# Patient Record
Sex: Male | Born: 1955 | Race: White | Hispanic: No | Marital: Married | Smoking: Former smoker
Health system: Southern US, Community
[De-identification: ages and names within clinical notes are randomized; demographics above are authoritative.]

## PROBLEM LIST (undated history)

## (undated) DIAGNOSIS — G4730 Sleep apnea, unspecified: Secondary | ICD-10-CM

## (undated) DIAGNOSIS — K649 Unspecified hemorrhoids: Secondary | ICD-10-CM

## (undated) DIAGNOSIS — C801 Malignant (primary) neoplasm, unspecified: Secondary | ICD-10-CM

## (undated) DIAGNOSIS — Z923 Personal history of irradiation: Secondary | ICD-10-CM

## (undated) DIAGNOSIS — M1990 Unspecified osteoarthritis, unspecified site: Secondary | ICD-10-CM

## (undated) DIAGNOSIS — C20 Malignant neoplasm of rectum: Secondary | ICD-10-CM

## (undated) DIAGNOSIS — T7840XA Allergy, unspecified, initial encounter: Secondary | ICD-10-CM

## (undated) DIAGNOSIS — Z9221 Personal history of antineoplastic chemotherapy: Secondary | ICD-10-CM

## (undated) HISTORY — PX: SURGERY SCROTAL / TESTICULAR: SUR1316

## (undated) HISTORY — DX: Unspecified osteoarthritis, unspecified site: M19.90

## (undated) HISTORY — PX: LUMBAR DISC SURGERY: SHX700

## (undated) HISTORY — DX: Malignant neoplasm of rectum: C20

---

## 1997-09-29 HISTORY — PX: SHOULDER SURGERY: SHX246

## 2004-09-29 HISTORY — PX: WRIST SURGERY: SHX841

## 2013-09-26 ENCOUNTER — Telehealth (INDEPENDENT_AMBULATORY_CARE_PROVIDER_SITE_OTHER): Payer: Self-pay

## 2013-09-26 DIAGNOSIS — C20 Malignant neoplasm of rectum: Secondary | ICD-10-CM

## 2013-09-26 HISTORY — DX: Malignant neoplasm of rectum: C20

## 2013-09-26 NOTE — Telephone Encounter (Signed)
Left message for Toni Amend that patient's appointment has been moved up to 09/27/13 with Dr. Michaell Cowing.  Asked they make the patient aware.

## 2013-09-27 ENCOUNTER — Encounter (INDEPENDENT_AMBULATORY_CARE_PROVIDER_SITE_OTHER): Payer: Self-pay | Admitting: Surgery

## 2013-09-27 ENCOUNTER — Ambulatory Visit (INDEPENDENT_AMBULATORY_CARE_PROVIDER_SITE_OTHER): Payer: Commercial Indemnity | Admitting: Surgery

## 2013-09-27 ENCOUNTER — Telehealth (INDEPENDENT_AMBULATORY_CARE_PROVIDER_SITE_OTHER): Payer: Self-pay

## 2013-09-27 VITALS — BP 140/86 | HR 71 | Temp 98.8°F | Resp 16 | Ht 67.5 in | Wt 249.4 lb

## 2013-09-27 DIAGNOSIS — K429 Umbilical hernia without obstruction or gangrene: Secondary | ICD-10-CM | POA: Insufficient documentation

## 2013-09-27 DIAGNOSIS — C19 Malignant neoplasm of rectosigmoid junction: Secondary | ICD-10-CM

## 2013-09-27 DIAGNOSIS — E669 Obesity, unspecified: Secondary | ICD-10-CM

## 2013-09-27 DIAGNOSIS — C20 Malignant neoplasm of rectum: Secondary | ICD-10-CM | POA: Insufficient documentation

## 2013-09-27 DIAGNOSIS — Z720 Tobacco use: Secondary | ICD-10-CM | POA: Insufficient documentation

## 2013-09-27 NOTE — Telephone Encounter (Signed)
LM for Dr Hulen Shouts nurse to call me so we can get pt scheduled for a EUS ASAP with tattoo marking on the tumor.

## 2013-09-27 NOTE — Progress Notes (Addendum)
Subjective:     Patient ID: Stephen Costa, male   DOB: 09/29/1955, 57 y.o.   MRN: [id number]  HPI  Note: This dictation was prepared with Dragon/digital dictation along with Smartphrase technology. Any transcriptional errors that result from this process are unintentional.       Stephen Costa  08/16/1956 [id number]  Patient Care Team: [name] Sun, MD as PCP - General (Family Medicine) Vincent C. Schooler, MD as Consulting Physician (Gastroenterology)  This patient is a 57 y.o.male who presents today for surgical evaluation at the request of Dr. Schooler.   Reason for visit: Mass at rectosigmoid junction very suspicious for cancer  Pleasant male who comes today with his wife.  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  He has had  intermittent rectal bleeding over the past few years.  Thought to be due to hemorrhoids.  Not too bad until recently.  He is also become more constipated with "rabbit pellet" more narrowed stools recently.  He has had some feelings of rectal urgency and discomfort with bowel movements.  Based on concerns, he was sent for colonoscopy.  A bulky mass was found at the rectosigmoid junction, Highly suspicious for cancer.  Surgical consultation was recommended.  He found out recently and an uncle diagnosed with colon cancer.  He does not know at what age.  No other family history of gastrointestinal tract cancers.  No personal nor family history of inflammatory bowel disease, irritable bowel syndrome, allergy such as Celiac Sprue, dietary/dairy problems, colitis, ulcers nor gastritis.  No recent sick contacts/gastroenteritis.  No travel outside the country.  No changes in diet.  He struggles with left knee arthritis and is limited in his walking.  He is obese.  He does smoke about one pack a day.  He has never had any abdominal surgery or anal rectal interventions.   No exertional chest/neck/shoulder/arm pain.  Patient can walk 15 minutes for about 1/4 miles  without difficulty.   Patient Active Problem List   Diagnosis Date Noted  . Rectal cancer - Anterior 8cm from anal verge 09/27/2013  . Umbilical hernia - 1cm 09/27/2013  . Obesity (BMI 30-39.9) 09/27/2013  . Tobacco abuse 09/27/2013    Past Medical History  Diagnosis Date  . Arthritis   . Rectal cancer 09/26/2013    Past Surgical History  Procedure Laterality Date  . Lumbar disc surgery  1984, 1987  . Wrist surgery Right 2006  . Shoulder surgery Right 1999  . Surgery scrotal / testicular Left 57 y/o    respositioning into scrotum    History   Social History  . Marital Status: Married    Spouse Name: N/A    Number of Children: N/A  . Years of Education: N/A   Occupational History  . Not on file.   Social History Main Topics  . Smoking status: Current Every Day Smoker -- 30 years    Types: Cigarettes  . Smokeless tobacco: Not on file  . Alcohol Use: No  . Drug Use: No  . Sexual Activity: Not on file   Other Topics Concern  . Not on file   Social History Narrative  . No narrative on file    Family History  Problem Relation Age of Onset  . Cancer Maternal Uncle     Throat Cancer  . Cancer Paternal Uncle     Colon Cancer    Current Outpatient Prescriptions  Medication Sig Dispense Refill  . Acetaminophen (TYLENOL PO)   Take by mouth as needed.       No current facility-administered medications for this visit.     Allergies  Allergen Reactions  . Percocet [Oxycodone-Acetaminophen] Other (See Comments)    MAKES PATIENT ANXIOUS & HYPER    BP 140/86  Pulse 71  Temp(Src) 98.8 F (37.1 C) (Temporal)  Resp 16  Ht 5' 7.5" (1.715 m)  Wt 249 lb 6.4 oz (113.127 kg)  BMI 38.46 kg/m2  No results found.   Review of Systems  Constitutional: Negative for fever, chills, diaphoresis, activity change, appetite change, fatigue and unexpected weight change.  HENT: Negative for ear discharge, facial swelling, mouth sores, nosebleeds, sore throat and trouble  swallowing.   Eyes: Negative for photophobia, discharge and visual disturbance.  Respiratory: Negative for choking, chest tightness, shortness of breath and stridor.   Cardiovascular: Negative for chest pain and palpitations.  Gastrointestinal: Positive for constipation, anal bleeding and rectal pain. Negative for nausea, vomiting, abdominal pain, diarrhea, blood in stool and abdominal distention.  Endocrine: Negative for cold intolerance and heat intolerance.  Genitourinary: Negative for dysuria, urgency, difficulty urinating and testicular pain.  Musculoskeletal: Positive for arthralgias. Negative for back pain, gait problem, myalgias, neck pain and neck stiffness.  Skin: Negative for color change, pallor, rash and wound.  Allergic/Immunologic: Negative for environmental allergies and food allergies.  Neurological: Negative for dizziness, speech difficulty, weakness, numbness and headaches.  Hematological: Negative for adenopathy. Does not bruise/bleed easily.  Psychiatric/Behavioral: Negative for hallucinations, confusion and agitation.       Objective:   Physical Exam  Constitutional: He is oriented to person, place, and time. He appears well-developed and well-nourished. No distress.  HENT:  Head: Normocephalic.  Mouth/Throat: Oropharynx is clear and moist. No oropharyngeal exudate.  Eyes: Conjunctivae and EOM are normal. Pupils are equal, round, and reactive to light. No scleral icterus.  Neck: Normal range of motion. Neck supple. No tracheal deviation present.  Cardiovascular: Normal rate, regular rhythm and intact distal pulses.   Pulmonary/Chest: Effort normal and breath sounds normal. No respiratory distress.  Abdominal: Soft. He exhibits no distension. There is no tenderness. A hernia is present. Hernia confirmed positive in the ventral area. Hernia confirmed negative in the right inguinal area and confirmed negative in the left inguinal area.    Genitourinary:  Exam done  with assistance of male Medical Assistant in the room.  Perianal skin clean with good hygiene.  No pruritis.  No pilonidal disease.  No fissure.  No abscess/fistula.  No external hemorrhoids.  Tolerates digital rectal exam.  Normal sphincter tone.  Hemorrhoidal piles WNL.  Anterior fungating tender mass somewhat fixed.  Distal end just above prostate ~8cm from anal verge   Musculoskeletal: Normal range of motion. He exhibits no tenderness.  Lymphadenopathy:    He has no cervical adenopathy.       Right: No inguinal adenopathy present.       Left: No inguinal adenopathy present.  Neurological: He is alert and oriented to person, place, and time. No cranial nerve deficit. He exhibits normal muscle tone. Coordination normal.  Skin: Skin is warm and dry. No rash noted. He is not diaphoretic. No erythema. No pallor.  Psychiatric: He has a normal mood and affect. His behavior is normal. Judgment and thought content normal.       Assessment:     Fungating anterior rectal mass with bleeding and constipating/obstructive symptoms, & narrowing caliber of stools.  Very suspicious for rectal cancer.     Plan:       Had a long discussion with the patient and his wife using audiovisual aids.  I reviewed his records.  We had a long discussion about the pathophysiology of rectal cancer.  We need more information first.  Obtain pathology results.  I would be suprised if adenocarcinoma is not diagnosed.   I would not trust any other diagnosis given the bulky nature, narrow caliber, partially fixed nature  CEA level  CT scan of chest/abdomen/pelvis.  Endoscopic rectal ultrasound to stage cancer depth.  If not technically feasible, do pelvic MRI.  If T3 or greater (strongly suspect this) proceed with neoadjuvant chemoradiation therapy, and the wait 10 weeks to do resection  Ultimately he will require rectal resection.  Sphincter sparing very feasible.  Given his risk factors (male, morbidly obese, smoker)  it will very likely require the patient to have a diverting loop ileostomy as the anastomosis is most likely going to be within 5 cm from the anal verge & leak risk will be higher.  However, we will see.  I did discuss the procedure with the patient his wife.  Good candidate for minimally invasive approach.  Robotic vs. Laparoscopic:  The anatomy & physiology of the digestive tract was discussed.  The pathophysiology was discussed.  Natural history risks without surgery was discussed.   I worked to give an overview of the disease and the frequent need to have multispecialty involvement.  I feel the risks of no intervention will lead to serious problems that outweigh the operative risks; therefore, I recommended a partial proctocolectomy to remove the pathology.  Laparoscopic & open techniques were discussed.  We will work to preserve anal & pelvic floor function without sacrificing cure.  Risks such as bleeding, infection, abscess, leak, reoperation, possible ostomy, hernia, heart attack, death, and other risks were discussed.  I noted a good likelihood this will help address the problem.   Goals of post-operative recovery were discussed as well.  We will work to minimize complications.  An educational handout on the pathology was given as well.  Questions were answered.    The patient & wife express understanding & wishes to proceed with surgery.He is very concerned about missing work.  I cautioned him that this is going to be a long drawn out involved process and he will need to take time to take care of this as it will kill him if ignored.   STOP SMOKING!  We talked to the patient about the dangers of smoking.  We stressed that tobacco use dramatically increases the risk of peri-operative complications such as infection, tissue necrosis leaving to problems with incision/wound and organ healing, hernia, chronic pain, heart attack, stroke, DVT, pulmonary embolism, and death.  We noted there are programs in  our community to help stop smoking.  Information was available.  I also recommended he try and get some regular exercise and low fat diet to lose some weight & minimize his risk of leak or hernia or other problems.  Consider swimming/water aerobics to take pressure off his knees.  Obviously that would be a challenge, but again it is something that he can try and do to help him get through this.       

## 2013-09-27 NOTE — Patient Instructions (Signed)
See the Handout(s) we gave you.  Please obtain the blood work, CAT scan, rectal ultrasound.  Once that is done, we will be able to offer further advice after we have a better sense of what stage this cancer is.  Please call us once these tests are done  Consider surgery To resect the rectal cancer.  Please call our office at [phone] if you wish to schedule surgery or if you have further questions / concerns.   Colorectal Cancer Colorectal cancer is an abnormal growth of tissue (tumor) in the colon or rectum that is cancerous (malignant). Unlike noncancerous (benign) tumors, malignant tumors can spread to other parts of your body. The colon is the large bowel or large intestine. The rectum is the last several inches of the colon.  RISK FACTORS The exact cause of colorectal cancer is unknown. However, the following factors may increase your chances of getting colorectal cancer:   Age older than 50 years.   Abnormal growths (polyps) on the inner wall of the colon or rectum.   Diabetes.   African American race.   Family history of hereditary nonpolyposis colorectal cancer. This condition is caused by changes in the genes that are responsible for repairing mismatched DNA.   Personal history of cancer. A person who has already had colorectal cancer may develop it a second time. Also, women with a history of ovarian, uterine, or breast cancer are at a somewhat higher risk of developing colorectal cancer.  Certain hereditary conditions.  Eating a diet that is high in fat (especially animal fat) and low in fiber, fruits, and vegetables.  Sedentary lifestyle.  Inflammatory bowel disease, including ulcerative colitis and Crohn disease.   Smoking.   Excessive alcohol use.  SYMPTOMS Early colorectal cancer often does not cause symptoms. As the cancer grows, symptoms may include:   Changes in bowel habits.  Diarrhea.   Constipation.   Feeling like the bowel does not  empty completely after a bowel movement.   Blood in the stool.   Stools that are narrower than usual.   Abdominal discomfort, pain, bloating, fullness, or cramps.  Frequent gas pain.   Unexplained weight loss.   Constant tiredness.   Nausea and vomiting.  DIAGNOSIS  Your health care provider will ask about your medical history. He or she may also perform a number of procedures, such as:   A physical exam.  A digital rectal exam.  A fecal occult blood test.  A barium enema.  Blood tests.   X-rays.   Imaging tests, such as CT scans or MRIs.   Taking a tissue sample (biopsy) from your colon or rectum to look for cancer cells.   A sigmoidoscopy to view the inside of the last part of your colon.   A colonoscopy to view the inside of your entire colon.   An endorectal ultrasound to see how deep a rectal tumor has grown and whether the cancer has spread to lymph nodes or other nearby tissues.  Your cancer will be staged to determine its severity and extent. Staging is a careful attempt to find out the size of the tumor, whether the cancer has spread, and if so, to what parts of the body. You may need to have more tests to determine the stage of your cancer. The test results will help determine what treatment plan is best for you.   Stage 0 The cancer is found only in the innermost lining of the colon or rectum.   Stage  I The cancer has grown into the inner wall of the colon or rectum. The cancer has not yet reached the outer wall of the colon.   Stage II The cancer extends more deeply into or through the wall of the colon or rectum. It may have invaded nearby tissue, but cancer cells have not spread to the lymph nodes.   Stage III The cancer has spread to nearby lymph nodes but not to other parts of the body.   Stage IV The cancer has spread to other parts of the body, such as the liver or lungs.  Your health care provider may tell you the detailed  stage of your cancer, which includes both a number and a letter.  TREATMENT  Depending on the type and stage, colorectal cancer may be treated with surgery, radiation therapy, chemotherapy, targeted therapy, or radiofrequency ablation. Some people have a combination of these therapies. Surgery may be done to remove the polyps from your colon. In early stages, your health care provider may be able to do this during a colonoscopy. In later stages, surgery may be done to remove part of your colon.  HOME CARE INSTRUCTIONS   Only take over-the-counter or prescription medicines for pain, discomfort, or fever as directed by your health care provider.   Maintain a healthy diet.   Consider joining a support group. This may help you learn to cope with the stress of having colorectal cancer.   Seek advice to help you manage treatment of side effects.   Keep all follow-up appointments as directed by your health care provider.   Inform your cancer specialist if you are admitted to the hospital.  SEEK MEDICAL CARE IF:  Your diarrhea or constipation does not go away.   Your bowel habits change.  You have increased abdominal pain.   You notice new fatigue or weakness.  You lose weight. Document Released: 09/15/2005 Document Revised: 05/18/2013 Document Reviewed: 03/10/2013 [facility] Patient Information 2014 [place], [place].  GETTING TO GOOD BOWEL HEALTH. Irregular bowel habits such as constipation and diarrhea can lead to many problems over time.  Having one soft bowel movement a day is the most important way to prevent further problems.  The anorectal canal is designed to handle stretching and feces to safely manage our ability to get rid of solid waste (feces, poop, stool) out of our body.  BUT, hard constipated stools can act like ripping concrete bricks and diarrhea can be a burning fire to this very sensitive area of our body, causing inflamed hemorrhoids, anal fissures, increasing risk  is perirectal abscesses, abdominal pain/bloating, an making irritable bowel worse.     The goal: ONE SOFT BOWEL MOVEMENT A DAY!  To have soft, regular bowel movements:    Drink at least 8 tall glasses of water a day.     Take plenty of fiber.  Fiber is the undigested part of plant food that passes into the colon, acting s "natures broom" to encourage bowel motility and movement.  Fiber can absorb and hold large amounts of water. This results in a larger, bulkier stool, which is soft and easier to pass. Work gradually over several weeks up to 6 servings a day of fiber (25g a day even more if needed) in the form of: o Vegetables -- Root (potatoes, carrots, turnips), leafy green (lettuce, salad greens, celery, spinach), or cooked high residue (cabbage, broccoli, etc) o Fruit -- Fresh (unpeeled skin & pulp), Dried (prunes, apricots, cherries, etc ),  or stewed (  applesauce)  o Whole grain breads, pasta, etc (whole wheat)  o Bran cereals    Bulking Agents -- This type of water-retaining fiber generally is easily obtained each day by one of the following:  o Psyllium bran -- The psyllium plant is remarkable because its ground seeds can retain so much water. This product is available as Metamucil, Konsyl, Effersyllium, Per Diem Fiber, or the less expensive generic preparation in drug and health food stores. Although labeled a laxative, it really is not a laxative.  o Methylcellulose -- This is another fiber derived from wood which also retains water. It is available as Citrucel. o Polyethylene Glycol - and "artificial" fiber commonly called Miralax or Glycolax.  It is helpful for people with gassy or bloated feelings with regular fiber o Flax Seed - a less gassy fiber than psyllium   No reading or other relaxing activity while on the toilet. If bowel movements take longer than 5 minutes, you are too constipated   AVOID CONSTIPATION.  High fiber and water intake usually takes care of this.  Sometimes a  laxative is needed to stimulate more frequent bowel movements, but    Laxatives are not a good long-term solution as it can wear the colon out. o Osmotics (Milk of Magnesia, Fleets phosphosoda, Magnesium citrate, MiraLax, GoLytely) are safer than  o Stimulants (Senokot, Castor Oil, Dulcolax, Ex Lax)    o Do not take laxatives for more than 7days in a row.    IF SEVERELY CONSTIPATED, try a Bowel Retraining Program: o Do not use laxatives.  o Eat a diet high in roughage, such as bran cereals and leafy vegetables.  o Drink six (6) ounces of prune or apricot juice each morning.  o Eat two (2) large servings of stewed fruit each day.  o Take one (1) heaping tablespoon of a psyllium-based bulking agent twice a day. Use sugar-free sweetener when possible to avoid excessive calories.  o Eat a normal breakfast.  o Set aside 15 minutes after breakfast to sit on the toilet, but do not strain to have a bowel movement.  o If you do not have a bowel movement by the third day, use an enema and repeat the above steps.    Controlling diarrhea o Switch to liquids and simpler foods for a few days to avoid stressing your intestines further. o Avoid dairy products (especially milk & ice cream) for a short time.  The intestines often can lose the ability to digest lactose when stressed. o Avoid foods that cause gassiness or bloating.  Typical foods include beans and other legumes, cabbage, broccoli, and dairy foods.  Every person has some sensitivity to other foods, so listen to our body and avoid those foods that trigger problems for you. o Adding fiber (Citrucel, Metamucil, psyllium, Miralax) gradually can help thicken stools by absorbing excess fluid and retrain the intestines to act more normally.  Slowly increase the dose over a few weeks.  Too much fiber too soon can backfire and cause cramping & bloating. o Probiotics (such as active yogurt, Align, etc) may help repopulate the intestines and colon with normal  bacteria and calm down a sensitive digestive tract.  Most studies show it to be of mild help, though, and such products can be costly. o Medicines:   Bismuth subsalicylate (ex. Kayopectate, Pepto Bismol) every 30 minutes for up to 6 doses can help control diarrhea.  Avoid if pregnant.   Loperamide (Immodium) can slow down diarrhea.  Start with  two tablets (4mg  total) first and then try one tablet every 6 hours.  Avoid if you are having fevers or severe pain.  If you are not better or start feeling worse, stop all medicines and call your doctor for advice o Call your doctor if you are getting worse or not better.  Sometimes further testing (cultures, endoscopy, X-ray studies, bloodwork, etc) may be needed to help diagnose and treat the cause of the diarrhea.  STOP SMOKING!  We strongly recommend that you stop smoking.  Smoking increases the risk of surgery including infection in the form of an open wound, pus formation, abscess, hernia at an incision on the abdomen, etc.  You have an increased risk of other MAJOR complications such as stroke, heart attack, forming clots in the leg and/or lungs, and death.    Smoking Cessation Quitting smoking is important to your health and has many advantages. However, it is not always easy to quit since nicotine is a very addictive drug. Often times, people try 3 times or more before being able to quit. This document explains the best ways for you to prepare to quit smoking. Quitting takes hard work and a lot of effort, but you can do it. ADVANTAGES OF QUITTING SMOKING  You will live longer, feel better, and live better.  Your body will feel the impact of quitting smoking almost immediately.  Within 20 minutes, blood pressure decreases. Your pulse returns to its normal level.  After 8 hours, carbon monoxide levels in the blood return to normal. Your oxygen level increases.  After 24 hours, the chance of having a heart attack starts to decrease. Your breath,  hair, and body stop smelling like smoke.  After 48 hours, damaged nerve endings begin to recover. Your sense of taste and smell improve.  After 72 hours, the body is virtually free of nicotine. Your bronchial tubes relax and breathing becomes easier.  After 2 to 12 weeks, lungs can hold more air. Exercise becomes easier and circulation improves.  The risk of having a heart attack, stroke, cancer, or lung disease is greatly reduced.  After 1 year, the risk of coronary heart disease is cut in half.  After 5 years, the risk of stroke falls to the same as a nonsmoker.  After 10 years, the risk of lung cancer is cut in half and the risk of other cancers decreases significantly.  After 15 years, the risk of coronary heart disease drops, usually to the level of a nonsmoker.  If you are pregnant, quitting smoking will improve your chances of having a healthy baby.  The people you live with, especially any children, will be healthier.  You will have extra money to spend on things other than cigarettes. QUESTIONS TO THINK ABOUT BEFORE ATTEMPTING TO QUIT You may want to talk about your answers with your caregiver.  Why do you want to quit?  If you tried to quit in the past, what helped and what did not?  What will be the most difficult situations for you after you quit? How will you plan to handle them?  Who can help you through the tough times? Your family? Friends? A caregiver?  What pleasures do you get from smoking? What ways can you still get pleasure if you quit? Here are some questions to ask your caregiver:  How can you help me to be successful at quitting?  What medicine do you think would be best for me and how should I take it?  What  should I do if I need more help?  What is smoking withdrawal like? How can I get information on withdrawal? GET READY  Set a quit date.  Change your environment by getting rid of all cigarettes, ashtrays, matches, and lighters in your  home, car, or work. Do not let people smoke in your home.  Review your past attempts to quit. Think about what worked and what did not. GET SUPPORT AND ENCOURAGEMENT You have a better chance of being successful if you have help. You can get support in many ways.  Tell your family, friends, and co-workers that you are going to quit and need their support. Ask them not to smoke around you.  Get individual, group, or telephone counseling and support. Programs are available at [employer] and health centers. Call your local health department for information about programs in your area.  Spiritual beliefs and practices may help some smokers quit.  Download a "quit meter" on your computer to keep track of quit statistics, such as how long you have gone without smoking, cigarettes not smoked, and money saved.  Get a self-help book about quitting smoking and staying off of tobacco. LEARN NEW SKILLS AND BEHAVIORS  Distract yourself from urges to smoke. Talk to someone, go for a walk, or occupy your time with a task.  Change your normal routine. Take a different route to work. Drink tea instead of coffee. Eat breakfast in a different place.  Reduce your stress. Take a hot bath, exercise, or read a book.  Plan something enjoyable to do every day. Reward yourself for not smoking.  Explore interactive web-based programs that specialize in helping you quit. GET MEDICINE AND USE IT CORRECTLY Medicines can help you stop smoking and decrease the urge to smoke. Combining medicine with the above behavioral methods and support can greatly increase your chances of successfully quitting smoking.  Nicotine replacement therapy helps deliver nicotine to your body without the negative effects and risks of smoking. Nicotine replacement therapy includes nicotine gum, lozenges, inhalers, nasal sprays, and skin patches. Some may be available over-the-counter and others require a prescription.  Antidepressant  medicine helps people abstain from smoking, but how this works is unknown. This medicine is available by prescription.  Nicotinic receptor partial agonist medicine simulates the effect of nicotine in your brain. This medicine is available by prescription. Ask your caregiver for advice about which medicines to use and how to use them based on your health history. Your caregiver will tell you what side effects to look out for if you choose to be on a medicine or therapy. Carefully read the information on the package. Do not use any other product containing nicotine while using a nicotine replacement product.  RELAPSE OR DIFFICULT SITUATIONS Most relapses occur within the first 3 months after quitting. Do not be discouraged if you start smoking again. Remember, most people try several times before finally quitting. You may have symptoms of withdrawal because your body is used to nicotine. You may crave cigarettes, be irritable, feel very hungry, cough often, get headaches, or have difficulty concentrating. The withdrawal symptoms are only temporary. They are strongest when you first quit, but they will go away within 10 14 days. To reduce the chances of relapse, try to:  Avoid drinking alcohol. Drinking lowers your chances of successfully quitting.  Reduce the amount of caffeine you consume. Once you quit smoking, the amount of caffeine in your body increases and can give you symptoms, such as a rapid  heartbeat, sweating, and anxiety.  Avoid smokers because they can make you want to smoke.  Do not let weight gain distract you. Many smokers will gain weight when they quit, usually less than 10 pounds. Eat a healthy diet and stay active. You can always lose the weight gained after you quit.  Find ways to improve your mood other than smoking. FOR MORE INFORMATION  www.smokefree.gov    While it can be one of the most difficult things to do, the [place] community has programs to help you stop.  Consider  talking with your primary care physician about options.  Also, Smoking Cessation classes are available through the [facility] Health:  The smoking cessation program is a proven-effective program from the American Lung Association. The program is available for anyone 67 and older who currently smokes. The program lasts for 7 weeks and is 8 sessions. Each class will be approximately 1 1/2 hours. The program is every Tuesday.  All classes are 12-1:30pm and same location.  Event Location Information:  Location: [facility] 2nd Floor Conference Room 2-037; located next to [facility] cross streets: [address] & [facility] Entrance into the [facility] is adjacent to the [facility] main entrance. The conference room is located on the 2nd floor.  Parking Instructions: Visitor parking is adjacent to [employer] main entrance and the [employer] [phone] or check the Classes and Support Groups   [email or web address] the event of inclemet weather please call [phone] or view online at [email or web address]  Exercise to Stay Healthy Exercise helps you become and stay healthy. EXERCISE IDEAS AND TIPS Choose exercises that:  You enjoy.  Fit into your day. You do not need to exercise really hard to be healthy. You can do exercises at a slow or medium level and stay healthy. You can:  Stretch before and after working out.  Try yoga, Pilates, or tai chi.  Lift weights.  Walk fast, swim, jog, run, climb stairs, bicycle, dance, or rollerskate.  Take aerobic classes. Exercises that burn about 150 calories:  Running 1  miles in 15 minutes.  Playing volleyball for 45 to 60 minutes.  Washing and waxing a car for 45 to 60 minutes.  Playing touch football for 45 minutes.  Walking 1  miles in 35 minutes.  Pushing a stroller 1  miles in 30 minutes.  Playing basketball for  30 minutes.  Raking leaves for 30 minutes.  Bicycling 5 miles in 30 minutes.  Walking 2 miles in 30 minutes.  Dancing for 30 minutes.  Shoveling snow for 15 minutes.  Swimming laps for 20 minutes.  Walking up stairs for 15 minutes.  Bicycling 4 miles in 15 minutes.  Gardening for 30 to 45 minutes.  Jumping rope for 15 minutes.  Washing windows or floors for 45 to 60 minutes. Document Released: 10/18/2010 Document Revised: 12/08/2011 Document Reviewed: 10/18/2010 [facility] Patient Information 2014 [place], [place].

## 2013-09-28 LAB — CEA: CEA: 5.8 ng/mL — ABNORMAL HIGH (ref 0.0–5.0)

## 2013-09-30 ENCOUNTER — Ambulatory Visit
Admission: RE | Admit: 2013-09-30 | Discharge: 2013-09-30 | Disposition: A | Discharge status: Home / Self Care | Payer: Managed Care, Other (non HMO) | Source: Ambulatory Visit | Attending: Surgery | Admitting: Surgery

## 2013-09-30 ENCOUNTER — Telehealth (INDEPENDENT_AMBULATORY_CARE_PROVIDER_SITE_OTHER): Payer: Self-pay

## 2013-09-30 DIAGNOSIS — C19 Malignant neoplasm of rectosigmoid junction: Secondary | ICD-10-CM

## 2013-09-30 MED ORDER — HYDROCODONE-ACETAMINOPHEN 5-325 MG PO TABS: 1.0000 | ORAL_TABLET | Freq: Four times a day (QID) | ORAL | Status: DC | PRN

## 2013-09-30 MED ORDER — IOHEXOL 300 MG/ML  SOLN
125.0000 mL | Freq: Once | INTRAMUSCULAR | Status: AC | PRN
  Administered 2013-09-30: 125 mL via INTRAVENOUS

## 2013-09-30 NOTE — Addendum Note (Signed)
Addended by: Illene Regulus on: 09/30/2013 11:38 AM   Modules accepted: Orders

## 2013-09-30 NOTE — Telephone Encounter (Signed)
Called pt's wife back to let her know that we are going to give pt a rx for Norco 5/325mg  #40 per Dr Johney Maine. The pt will come by the office to p/u script at front desk.

## 2013-09-30 NOTE — Telephone Encounter (Signed)
The patient's wife called and states the pt is in pain at his rectum.  He has a large tumor.  The tylenol is not touching his pain and she states he can't take any Ibuprofen.  She asked if he can have anything for pain.  I told her I will send a message to Dr Johney Maine

## 2013-10-03 ENCOUNTER — Telehealth (INDEPENDENT_AMBULATORY_CARE_PROVIDER_SITE_OTHER): Payer: Self-pay

## 2013-10-03 ENCOUNTER — Other Ambulatory Visit: Payer: Self-pay | Admitting: Gastroenterology

## 2013-10-03 NOTE — Telephone Encounter (Signed)
Dr. Johney Maine calling this morning to inquire about pt's pathology results which were unavailable when he saw the patient last week.  Call placed to [facility] and results were faxed.  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  No high grade dysplasia identified.  Will contact Dr. [name] requesting additional biopsy.  Dr. Johney Maine' nurse, Lars Mage, to be made aware, and copy of results given to her.

## 2013-10-03 NOTE — Addendum Note (Signed)
Addended by: [name] on: 10/03/2013 04:50 PM   Modules accepted: Orders

## 2013-10-03 NOTE — Telephone Encounter (Signed)
Pt is scheduled for U/S by Dr. [name] at [facility] tomorrow at 3:00pm.

## 2013-10-04 ENCOUNTER — Ambulatory Visit (INDEPENDENT_AMBULATORY_CARE_PROVIDER_SITE_OTHER): Payer: Self-pay | Admitting: General Surgery

## 2013-10-04 NOTE — Telephone Encounter (Signed)
Called pt to give him results of the CT Chest,Abd,&pelvis. The Chest CT shows a nonspecific small lung nodule that will need to be followed up in 6-12 months with a repeat scan per Dr Johney Maine. I advised pt that once we get more info we will let medical oncology follow the lung nodule to see if any further work up needs to be done per Dr Johney Maine. I advised pt that the rest of the scans look good per Dr Johney Maine. The pt goes today to get his EUS by Dr [name] so we will be back in touch with him after those results come back. The pt understands.

## 2013-10-05 ENCOUNTER — Encounter (HOSPITAL_COMMUNITY): Payer: Self-pay

## 2013-10-05 ENCOUNTER — Encounter (HOSPITAL_COMMUNITY)
Admission: RE | Disposition: A | Discharge status: Home / Self Care | Payer: Self-pay | Source: Ambulatory Visit | Attending: Gastroenterology

## 2013-10-05 ENCOUNTER — Ambulatory Visit (HOSPITAL_COMMUNITY)
Admission: RE | Admit: 2013-10-05 | Discharge: 2013-10-05 | Disposition: A | Discharge status: Home / Self Care | Payer: Managed Care, Other (non HMO) | Source: Ambulatory Visit | Attending: Gastroenterology | Admitting: Gastroenterology

## 2013-10-05 DIAGNOSIS — IMO0002 Reserved for concepts with insufficient information to code with codable children: Secondary | ICD-10-CM

## 2013-10-05 DIAGNOSIS — F17200 Nicotine dependence, unspecified, uncomplicated: Secondary | ICD-10-CM | POA: Insufficient documentation

## 2013-10-05 DIAGNOSIS — C20 Malignant neoplasm of rectum: Secondary | ICD-10-CM | POA: Insufficient documentation

## 2013-10-05 DIAGNOSIS — E669 Obesity, unspecified: Secondary | ICD-10-CM | POA: Insufficient documentation

## 2013-10-05 DIAGNOSIS — C801 Malignant (primary) neoplasm, unspecified: Secondary | ICD-10-CM

## 2013-10-05 DIAGNOSIS — Z800 Family history of malignant neoplasm of digestive organs: Secondary | ICD-10-CM | POA: Insufficient documentation

## 2013-10-05 DIAGNOSIS — M1710 Unilateral primary osteoarthritis, unspecified knee: Secondary | ICD-10-CM | POA: Insufficient documentation

## 2013-10-05 HISTORY — PX: FLEXIBLE SIGMOIDOSCOPY: SHX5431

## 2013-10-05 HISTORY — DX: Malignant (primary) neoplasm, unspecified: C80.1

## 2013-10-05 HISTORY — PX: EUS: SHX5427

## 2013-10-05 HISTORY — DX: Sleep apnea, unspecified: G47.30

## 2013-10-05 HISTORY — DX: Unspecified hemorrhoids: K64.9

## 2013-10-05 SURGERY — ULTRASOUND, LOWER GI TRACT, ENDOSCOPIC
Anesthesia: Moderate Sedation

## 2013-10-05 MED ORDER — SPOT INK MARKER SYRINGE KIT
PACK | SUBMUCOSAL | Status: AC
  Filled 2013-10-05: qty 5

## 2013-10-05 MED ORDER — MIDAZOLAM HCL 10 MG/2ML IJ SOLN
INTRAMUSCULAR | Status: DC | PRN
  Administered 2013-10-05: 2 mg via INTRAVENOUS
  Administered 2013-10-05: 1 mg via INTRAVENOUS
  Administered 2013-10-05: 2 mg via INTRAVENOUS

## 2013-10-05 MED ORDER — SODIUM CHLORIDE 0.9 % IV SOLN
INTRAVENOUS | Status: DC
  Administered 2013-10-05: 500 mL via INTRAVENOUS

## 2013-10-05 MED ORDER — FENTANYL CITRATE 0.05 MG/ML IJ SOLN
INTRAMUSCULAR | Status: AC
  Filled 2013-10-05: qty 2

## 2013-10-05 MED ORDER — FENTANYL CITRATE 0.05 MG/ML IJ SOLN
INTRAMUSCULAR | Status: DC | PRN
  Administered 2013-10-05 (×3): 25 ug via INTRAVENOUS

## 2013-10-05 MED ORDER — MIDAZOLAM HCL 10 MG/2ML IJ SOLN
INTRAMUSCULAR | Status: AC
  Filled 2013-10-05: qty 2

## 2013-10-05 MED ORDER — SPOT INK MARKER SYRINGE KIT
PACK | SUBMUCOSAL | Status: DC | PRN
  Administered 2013-10-05: 5 mL via SUBMUCOSAL

## 2013-10-05 SURGICAL SUPPLY — 14 items

## 2013-10-05 NOTE — Interval H&P Note (Signed)
History and Physical Interval Note:  10/05/2013 1:58 PM  Stephen Costa  has presented today for surgery, with the diagnosis of rectal mass  The various methods of treatment have been discussed with the patient and family. After consideration of risks, benefits and other options for treatment, the patient has consented to  Procedure(s): LOWER ENDOSCOPIC ULTRASOUND (EUS) (N/A) as a surgical intervention .  The patient's history has been reviewed, patient examined, no change in status, stable for surgery.  I have reviewed the patient's chart and labs.  Questions were answered to the patient's satisfaction.     Allyne Hebert M  Assessment:  1.  Rectal mass.  Prior biopsies showing tubulovillous adenoma only.  Plan:  1.  Endorectal ultrasound.  May need further mucosal biopsies and will need to tattoo lesion site. 2.  Risks (bleeding, infection, bowel perforation that could require surgery, sedation-related changes in cardiopulmonary systems), benefits (identification and possible treatment of source of symptoms, exclusion of certain causes of symptoms), and alternatives (watchful waiting, radiographic imaging studies, empiric medical treatment) of endorectal ultrasound (rectal ultrasound, RUS), were explained to patient/family in detail and patient wishes to proceed.

## 2013-10-05 NOTE — H&P (View-Only) (Signed)
Subjective:     Patient ID: Stephen Costa, male   DOB: Jul 01, 1956, 58 y.o.   MRN: [id number]  HPI  Note: This dictation was prepared with Dragon/digital dictation along with [facility] technology. Any transcriptional errors that result from this process are unintentional.       Stephen Costa  [birth year] [id number]  Patient Care Team: Lynne Logan, MD as PCP - General (Family Medicine) [name], MD as Consulting Physician (Gastroenterology)  This patient is a 58 y.o.male who presents today for surgical evaluation at the request of Dr. Michail Sermon.   Reason for visit: Mass at rectosigmoid junction very suspicious for cancer  Pleasant male who comes today with his wife.  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  He has had  intermittent rectal bleeding over the past few years.  Thought to be due to hemorrhoids.  Not too bad until recently.  He is also become more constipated with "rabbit pellet" more narrowed stools recently.  He has had some feelings of rectal urgency and discomfort with bowel movements.  Based on concerns, he was sent for colonoscopy.  A bulky mass was found at the rectosigmoid junction, Highly suspicious for cancer.  Surgical consultation was recommended.  He found out recently and an uncle diagnosed with colon cancer.  He does not know at what age.  No other family history of gastrointestinal tract cancers.  No personal nor family history of inflammatory bowel disease, irritable bowel syndrome, allergy such as Celiac Sprue, dietary/dairy problems, colitis, ulcers nor gastritis.  No recent sick contacts/gastroenteritis.  No travel outside the country.  No changes in diet.  He struggles with left knee arthritis and is limited in his walking.  He is obese.  He does smoke about one pack a day.  He has never had any abdominal surgery or anal rectal interventions.   No exertional chest/neck/shoulder/arm pain.  Patient can walk 15 minutes for about 1/4 miles  without difficulty.   Patient Active Problem List   Diagnosis Date Noted  . Rectal cancer - Anterior 8cm from anal verge 09/27/2013  . Umbilical hernia - 1cm 0000000  . Obesity (BMI 30-39.9) 09/27/2013  . Tobacco abuse 09/27/2013    Past Medical History  Diagnosis Date  . Arthritis   . Rectal cancer 09/26/2013    Past Surgical History  Procedure Laterality Date  . Lumbar disc surgery  1984, 1987  . Wrist surgery Right 2006  . Shoulder surgery Right 1999  . Surgery scrotal / testicular Left 58 y/o    respositioning into scrotum    History   Social History  . Marital Status: Married    Spouse Name: N/A    Number of Children: N/A  . Years of Education: N/A   Occupational History  . Not on file.   Social History Main Topics  . Smoking status: Current Every Day Smoker -- 30 years    Types: Cigarettes  . Smokeless tobacco: Not on file  . Alcohol Use: No  . Drug Use: No  . Sexual Activity: Not on file   Other Topics Concern  . Not on file   Social History Narrative  . No narrative on file    Family History  Problem Relation Age of Onset  . Cancer Maternal Uncle     Throat Cancer  . Cancer Paternal Uncle     Colon Cancer    Current Outpatient Prescriptions  Medication Sig Dispense Refill  . Acetaminophen (TYLENOL PO)  Take by mouth as needed.       No current facility-administered medications for this visit.     Allergies  Allergen Reactions  . Percocet [Oxycodone-Acetaminophen] Other (See Comments)    MAKES PATIENT ANXIOUS & HYPER    BP 140/86  Pulse 71  Temp(Src) 98.8 F (37.1 C) (Temporal)  Resp 16  Ht 5' 7.5" (1.715 m)  Wt 249 lb 6.4 oz (113.127 kg)  BMI 38.46 kg/m2  No results found.   Review of Systems  Constitutional: Negative for fever, chills, diaphoresis, activity change, appetite change, fatigue and unexpected weight change.  HENT: Negative for ear discharge, facial swelling, mouth sores, nosebleeds, sore throat and trouble  swallowing.   Eyes: Negative for photophobia, discharge and visual disturbance.  Respiratory: Negative for choking, chest tightness, shortness of breath and stridor.   Cardiovascular: Negative for chest pain and palpitations.  Gastrointestinal: Positive for constipation, anal bleeding and rectal pain. Negative for nausea, vomiting, abdominal pain, diarrhea, blood in stool and abdominal distention.  Endocrine: Negative for cold intolerance and heat intolerance.  Genitourinary: Negative for dysuria, urgency, difficulty urinating and testicular pain.  Musculoskeletal: Positive for arthralgias. Negative for back pain, gait problem, myalgias, neck pain and neck stiffness.  Skin: Negative for color change, pallor, rash and wound.  Allergic/Immunologic: Negative for environmental allergies and food allergies.  Neurological: Negative for dizziness, speech difficulty, weakness, numbness and headaches.  Hematological: Negative for adenopathy. Does not bruise/bleed easily.  Psychiatric/Behavioral: Negative for hallucinations, confusion and agitation.       Objective:   Physical Exam  Constitutional: He is oriented to person, place, and time. He appears well-developed and well-nourished. No distress.  HENT:  Head: Normocephalic.  Mouth/Throat: Oropharynx is clear and moist. No oropharyngeal exudate.  Eyes: Conjunctivae and EOM are normal. Pupils are equal, round, and reactive to light. No scleral icterus.  Neck: Normal range of motion. Neck supple. No tracheal deviation present.  Cardiovascular: Normal rate, regular rhythm and intact distal pulses.   Pulmonary/Chest: Effort normal and breath sounds normal. No respiratory distress.  Abdominal: Soft. He exhibits no distension. There is no tenderness. A hernia is present. Hernia confirmed positive in the ventral area. Hernia confirmed negative in the right inguinal area and confirmed negative in the left inguinal area.    Genitourinary:  Exam done  with assistance of male Medical Assistant in the room.  Perianal skin clean with good hygiene.  No pruritis.  No pilonidal disease.  No fissure.  No abscess/fistula.  No external hemorrhoids.  Tolerates digital rectal exam.  Normal sphincter tone.  Hemorrhoidal piles WNL.  Anterior fungating tender mass somewhat fixed.  Distal end just above prostate ~8cm from anal verge   Musculoskeletal: Normal range of motion. He exhibits no tenderness.  Lymphadenopathy:    He has no cervical adenopathy.       Right: No inguinal adenopathy present.       Left: No inguinal adenopathy present.  Neurological: He is alert and oriented to person, place, and time. No cranial nerve deficit. He exhibits normal muscle tone. Coordination normal.  Skin: Skin is warm and dry. No rash noted. He is not diaphoretic. No erythema. No pallor.  Psychiatric: He has a normal mood and affect. His behavior is normal. Judgment and thought content normal.       Assessment:     Fungating anterior rectal mass with bleeding and constipating/obstructive symptoms, & narrowing caliber of stools.  Very suspicious for rectal cancer.     Plan:  Had a long discussion with the patient and his wife using audiovisual aids.  I reviewed his records.  We had a long discussion about the pathophysiology of rectal cancer.  We need more information first.  Obtain pathology results.  I would be suprised if adenocarcinoma is not diagnosed.   I would not trust any other diagnosis given the bulky nature, narrow caliber, partially fixed nature  CEA level  CT scan of chest/abdomen/pelvis.  Endoscopic rectal ultrasound to stage cancer depth.  If not technically feasible, do pelvic MRI.  If T3 or greater (strongly suspect this) proceed with neoadjuvant chemoradiation therapy, and the wait 10 weeks to do resection  Ultimately he will require rectal resection.  Sphincter sparing very feasible.  Given his risk factors (male, morbidly obese, smoker)  it will very likely require the patient to have a diverting loop ileostomy as the anastomosis is most likely going to be within 5 cm from the anal verge & leak risk will be higher.  However, we will see.  I did discuss the procedure with the patient his wife.  Good candidate for minimally invasive approach.  Robotic vs. Laparoscopic:  The anatomy & physiology of the digestive tract was discussed.  The pathophysiology was discussed.  Natural history risks without surgery was discussed.   I worked to give an overview of the disease and the frequent need to have multispecialty involvement.  I feel the risks of no intervention will lead to serious problems that outweigh the operative risks; therefore, I recommended a partial proctocolectomy to remove the pathology.  Laparoscopic & open techniques were discussed.  We will work to preserve anal & pelvic floor function without sacrificing cure.  Risks such as bleeding, infection, abscess, leak, reoperation, possible ostomy, hernia, heart attack, death, and other risks were discussed.  I noted a good likelihood this will help address the problem.   Goals of post-operative recovery were discussed as well.  We will work to minimize complications.  An educational handout on the pathology was given as well.  Questions were answered.    The patient & wife express understanding & wishes to proceed with surgery.He is very concerned about missing work.  I cautioned him that this is going to be a long drawn out involved process and he will need to take time to take care of this as it will kill him if ignored.   STOP SMOKING!  We talked to the patient about the dangers of smoking.  We stressed that tobacco use dramatically increases the risk of peri-operative complications such as infection, tissue necrosis leaving to problems with incision/wound and organ healing, hernia, chronic pain, heart attack, stroke, DVT, pulmonary embolism, and death.  We noted there are programs in  our community to help stop smoking.  Information was available.  I also recommended he try and get some regular exercise and low fat diet to lose some weight & minimize his risk of leak or hernia or other problems.  Consider swimming/water aerobics to take pressure off his knees.  Obviously that would be a challenge, but again it is something that he can try and do to help him get through this.

## 2013-10-05 NOTE — Discharge Instructions (Signed)
Endorectal ultrasound and sigmoidoscopy  Post procedure instructions:  Read the instructions outlined below and refer to this sheet in the next few weeks. These discharge instructions provide you with general information on caring for yourself after you leave the hospital. Your doctor may also give you specific instructions. While your treatment has been planned according to the most current medical practices available, unavoidable complications occasionally occur. If you have any problems or questions after discharge, call Dr. [name] at [facility] [phone]).  HOME CARE INSTRUCTIONS  ACTIVITY:  You may resume your regular activity, but move at a slower pace for the next 24 hours.   Take frequent rest periods for the next 24 hours.   Walking will help get rid of the air and reduce the bloated feeling in your belly (abdomen).   No driving for 24 hours (because of the medicine (anesthesia) used during the test).   You may shower.   Do not sign any important legal documents or operate any machinery for 24 hours (because of the anesthesia used during the test).  NUTRITION:  Drink plenty of fluids.   You may resume your normal diet as instructed by your doctor.   Begin with a light meal and progress to your normal diet. Heavy or fried foods are harder to digest and may make you feel sick to your stomach (nauseated).   Avoid alcoholic beverages for 24 hours or as instructed.  MEDICATIONS:  You may resume your normal medications unless your doctor tells you otherwise.  WHAT TO EXPECT TODAY:  Some feelings of bloating in the abdomen.   Passage of more gas than usual.   Spotting of blood in your stool or on the toilet paper.  IF YOU HAD POLYPS REMOVED DURING THE PROCEDURE:  No aspirin products for 7 days or as instructed.   No alcohol for 7 days or as instructed.   Eat a soft diet for the next 24 hours.   FINDING OUT THE RESULTS OF YOUR TEST  Not all test results  are available during your visit. If your test results are not back during the visit, make an appointment with your caregiver to find out the results. Do not assume everything is normal if you have not heard from your caregiver or the medical facility. It is important for you to follow up on all of your test results.     SEEK IMMEDIATE MEDICAL CARE IF:   You have more than a spotting of blood in your stool.   Your belly is swollen (abdominal distention).   You are nauseated or vomiting.   You have a fever.   You have abdominal pain or discomfort that is severe or gets worse throughout the day.    Document Released: 04/29/2004 Document Revised: 05/28/2011 Document Reviewed: 04/27/2008 [facility] Patient Information 2012 [place].

## 2013-10-05 NOTE — Op Note (Signed)
[facility] [place], [address]   OPERATIVE PROCEDURE REPORT  PATIENT: Stephen Costa, Stephen Costa  MR#: [id number] BIRTHDATE: 28-Jun-1956  GENDER: Male ENDOSCOPIST: [name], MD REFERRED BY:  Michael Boston, M.D.  Wilford Corner, M.D. PROCEDURE DATE:  10/05/2013 PROCEDURE:   Flexible sigmoidoscopy with submucosal injection and biopsies; endorectal ultrasound ASA CLASS:   Class II INDICATIONS:1.  rectal mass. MEDICATIONS: Fentanyl 75 mcg IV and Versed 5 mg IV  DESCRIPTION OF PROCEDURE:   After the risks benefits and alternatives of the procedure were thoroughly explained, informed consent was obtained.  Throughout the procedure, the patients blood pressure, pulse and oxygen saturations were monitored continuously. Under direct visualization, the Radial EUS [id number]  endoscope was introduced through the anus  and advanced to the sigmoid colon .  Water was used as necessary to provide an acoustic interface. Imaging was obtained at 7.5 and 12Mhz. Upon completion of the imaging, water was removed and the patient was sent to the recovery room in satisfactory condition.    FINDINGS:   Fixed firm lesion palpated at end of examination digit. Endoscopically, there is a 75% circumferential, friable, excavated mass with central ulceration, extending from 7 cm to 13 cm from the anal verge, corresponding to the mid-to-distal rectum.  Lesion is near the prostate (but does not seem to invade it), and involves the anterior, right lateral and posterior walls of the rectum, and spares the left lateral rectum.  Via endorectal ultrasound, the lesion is seen to penetrate the muscularis propria in several regions.   There are a couple peritumoral lymph nodes seen. Multiple repeat mucosal biopsies of the mass were taken with cold forceps.  The proximal and distal margins of the tumor were marked via submucosal injection of [place] Ink.  STAGING: Assuming this is a rectal  adenocarcinoma, it would be staged T3 N1 Mx by endorectal ultrasound.  ENDOSCOPIC IMPRESSION: As above.  Overall findings most consistent with malignancy (adenocarcinoma); suspect prior biopsies not representative of the entire lesion.  RECOMMENDATIONS: 1.  Watch for potential complications of procedure.  Some spot bleeding is to be expected from today's biopsies. 2.  Await biopsy results (sent "rush"). 3.  If malignancy is confirmed, patient would likely require chemoradiative therapy prior to consideration of surgical resection. 4.  Case discussed in detail with Dr. Johney Maine.   _______________________________ Lorrin MaisArta Silence, MD 10/05/2013 2:59 PM   CC:

## 2013-10-06 ENCOUNTER — Encounter (HOSPITAL_COMMUNITY): Payer: Self-pay | Admitting: Gastroenterology

## 2013-10-07 ENCOUNTER — Ambulatory Visit (INDEPENDENT_AMBULATORY_CARE_PROVIDER_SITE_OTHER): Payer: Self-pay | Admitting: General Surgery

## 2013-10-10 ENCOUNTER — Telehealth (INDEPENDENT_AMBULATORY_CARE_PROVIDER_SITE_OTHER): Payer: Self-pay | Admitting: *Deleted

## 2013-10-10 ENCOUNTER — Other Ambulatory Visit (INDEPENDENT_AMBULATORY_CARE_PROVIDER_SITE_OTHER): Payer: Self-pay | Admitting: *Deleted

## 2013-10-10 ENCOUNTER — Telehealth: Payer: Self-pay | Admitting: *Deleted

## 2013-10-10 DIAGNOSIS — C19 Malignant neoplasm of rectosigmoid junction: Secondary | ICD-10-CM

## 2013-10-10 MED ORDER — HYDROCODONE-ACETAMINOPHEN 5-325 MG PO TABS: 1.0000 | ORAL_TABLET | Freq: Four times a day (QID) | ORAL | Status: DC | PRN

## 2013-10-10 NOTE — Telephone Encounter (Signed)
Spoke to Dr. Johney Maine who approved a refill of Norco to try to get the patient through until he see's Dr. Benay Spice.  Spoke to [place] at the [facility] who is scheduling patient to see Dr. Benay Spice on the 19th.  I spoke to [place] who is placing med and rad oncology referrals in now.  Norco prescription will be written out and await urgent office MD to sign.

## 2013-10-10 NOTE — Telephone Encounter (Signed)
Confirmed appointments with Dr. Benay Spice and Dr. Lisbeth Renshaw.  Contact names, numbers, and directions were provided.

## 2013-10-10 NOTE — Telephone Encounter (Signed)
Called pt to notify him that the bx from the EUS does confirm rectal cancer. I advised pt that DR Gross wants referrals to the [facility] for pt to see medical and radiation oncology. I have placed both orders in epic now and sent Marcellus Scott a message in epic as well. I advised pt that he will need to go speak with both doctors now to get more info to see wether they would want pt to start tx now and wait on surgery or vice versa. The pt understands and will wait to get a call from the cancer ctr.

## 2013-10-10 NOTE — Telephone Encounter (Signed)
Patient's spouse called to ask for a refill on the Norco prescription.  She is also asking what the plan moving forward is.  She states she is a Administrator and her job is asking when she can return to work however she doesn't want to leave him here since he is unable to drive.  Explained that I will send a message to Dr. Johney Maine to get his opinion on all this and then I will let them know.  She states understanding and agreeable at this time.

## 2013-10-12 ENCOUNTER — Ambulatory Visit (INDEPENDENT_AMBULATORY_CARE_PROVIDER_SITE_OTHER): Payer: Self-pay | Admitting: Surgery

## 2013-10-17 ENCOUNTER — Ambulatory Visit: Payer: Managed Care, Other (non HMO) | Admitting: Oncology

## 2013-10-17 ENCOUNTER — Ambulatory Visit: Payer: Managed Care, Other (non HMO)

## 2013-10-18 ENCOUNTER — Telehealth: Payer: Self-pay | Admitting: Oncology

## 2013-10-18 ENCOUNTER — Ambulatory Visit (HOSPITAL_BASED_OUTPATIENT_CLINIC_OR_DEPARTMENT_OTHER): Payer: Managed Care, Other (non HMO) | Admitting: Oncology

## 2013-10-18 ENCOUNTER — Ambulatory Visit: Payer: Managed Care, Other (non HMO)

## 2013-10-18 ENCOUNTER — Other Ambulatory Visit: Payer: Self-pay | Admitting: *Deleted

## 2013-10-18 VITALS — BP 142/90 | HR 94 | Temp 97.7°F | Resp 17 | Ht 67.5 in | Wt 249.7 lb

## 2013-10-18 DIAGNOSIS — K625 Hemorrhage of anus and rectum: Secondary | ICD-10-CM

## 2013-10-18 DIAGNOSIS — C19 Malignant neoplasm of rectosigmoid junction: Secondary | ICD-10-CM

## 2013-10-18 DIAGNOSIS — F17200 Nicotine dependence, unspecified, uncomplicated: Secondary | ICD-10-CM

## 2013-10-18 DIAGNOSIS — K5900 Constipation, unspecified: Secondary | ICD-10-CM

## 2013-10-18 DIAGNOSIS — C20 Malignant neoplasm of rectum: Secondary | ICD-10-CM

## 2013-10-18 DIAGNOSIS — R911 Solitary pulmonary nodule: Secondary | ICD-10-CM

## 2013-10-18 MED ORDER — HYDROCODONE-ACETAMINOPHEN 5-325 MG PO TABS: 1.0000 | ORAL_TABLET | Freq: Four times a day (QID) | ORAL | Status: DC | PRN

## 2013-10-18 MED ORDER — DOCUSATE SODIUM 100 MG PO CAPS: 100.0000 mg | ORAL_CAPSULE | Freq: Two times a day (BID) | ORAL | Status: DC

## 2013-10-18 NOTE — Progress Notes (Signed)
Met with Stephen Costa.   Explained role of nurse navigator. Educational information provided on colorectal cancer  Referral made to dietician for diet education. [place] resources provided to patient, including SW service information.  Contact names and phone numbers were provided for entire [facility] team.  Smoking cessation information was provided, including the class offered through [facility] health.  The patient declined assistance at this time and said that he could stop on his own.  Teach back method was used.   Will continue to follow for barriers to care as needed.

## 2013-10-18 NOTE — Telephone Encounter (Signed)
gv adn printed appt sched and avs forpt for Jan adn Feb.....   °

## 2013-10-18 NOTE — Telephone Encounter (Signed)
gv and printeda ppt sched and avs for pt for Jan and Feb. °

## 2013-10-19 ENCOUNTER — Encounter: Payer: Self-pay | Admitting: Oncology

## 2013-10-19 ENCOUNTER — Encounter: Payer: Self-pay | Admitting: Radiation Oncology

## 2013-10-19 ENCOUNTER — Other Ambulatory Visit: Payer: Self-pay | Admitting: *Deleted

## 2013-10-19 MED ORDER — CAPECITABINE 500 MG PO TABS: 2000.0000 mg | ORAL_TABLET | Freq: Two times a day (BID) | ORAL | Status: DC

## 2013-10-19 NOTE — Telephone Encounter (Signed)
[facility] faxed confirmation of facsimile receipt for Xeloda prescription referral.  [facility] will verify insurance and make delivery arrangements with patient.

## 2013-10-19 NOTE — Progress Notes (Signed)
GU Location of Tumor / Histology: Rectum=found mass at rectosigmoid junction   Patient presented months ago with signs/symptoms of: intermittent rectal bleeding past few years,rectal urgency,constipation,and discomfort with bowel movements  Biopsies of  (if applicable) revealed: Diagnosis 10/05/13:Rectum, biopsy- INVASIVE ADENOCARCINOMA, SEE COMMENT.  Past/Anticipated interventions by urology, if any:  10/05/13 flexible sigmoidoscopy with bxs  With Dr. [name], prior biopsies showing tubulovillous adenoma only  Past/Anticipated interventions by medical oncology, if any: Navigator Marcellus Scott met with patient 10/18/13, Chemo education scheduled 10/21/13 with lab, 10/24/13 scheduled appt with Ernestene Kiel, dietician, 11/11/13 appt with Lattie Haw Thomas,NP/MED/ONC  Weight changes, if any: no  Bowel/Bladder complaints, if any: constipated,changes in  Narrowed stools recently  Nausea/Vomiting, if any: no  Pain issues, if any:  Rectum sore, constipated, blood on tissue when he has bowel movements  SAFETY ISSUES: No  Prior radiation? No  Pacemaker/ICD? No    Is the patient on methotrexate? 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 [place], [id number], ,testicular surgery, 18y age,  Obesity, hemorrhoids Arthritis knees,dificulty walking

## 2013-10-19 NOTE — Progress Notes (Signed)
[place] Patient Consult   Referring MD: Obed Samek 58 y.o.  1956-07-05    Reason for Referral: Rectal cancer     HPI: He reports a 1-1/2 year history of "hemorrhoid "pain. The pain has progressed over the past several months and he developed constipation. He reports pain with bowel movements and bleeding with bowel movements. He relocated to [facility] and was referred to Dr. Michail Sermon. He was taken to a colonoscopy 09/26/2013. The perianal and digital rectal examinations were normal. A partially obstructing mass was found in the rectosigmoid colon from 10-15 cm proximal to the anus. Stool was noted in the entire examined colon. Internal hemorrhoids were present. The pathology revealed superficial fragments of a tubulovillous adenoma. High-grade dysplasia was not identified.  He was referred to Dr. [name] and was taken to an endoscopic ultrasound procedure on 10/05/2013. A firm fixed lesion was palpated on digital exam. The mass extended from 7-13 cm from the anal verge. The lesion was noted to penetrate the muscular propria in several regions. Peritumoral lymph nodes were seen. Multiple biopsies were obtained in the area was tattooed. The lesion was staged as a T3 N1 tumor by ultrasound.  The pathology ([id number]) confirmed invasive adenocarcinoma.  CT scans of the chest, abdomen, and pelvis on 09/30/2013 revealed mild emphysema. 3 mm right upper lobe nodule was nonspecific. Fatty infiltration of the liver asymmetric thickening of the rectum was noted. A 5 mm lymph node was seen adjacent to the left rectum. No pathologically enlarged lymph nodes.   He was referred to Dr. Johney Maine. An anterior mass was palpated 8 cm from the anal verge.   Past Medical History  Diagnosis Date  .    Marland Kitchen Sleep apnea     wears cpap most nights  . Hemorrhoids   . Cancer 10/05/13    rectum (uT3uN1 (      . Arthritis     knees    Past Surgical History  Procedure  Laterality Date  . Lumbar disc surgery  1984, 1987  . Wrist surgery Right 2006  . Shoulder surgery Right 1999  . Surgery scrotal / testicular Left 58 y/o    respositioning into scrotum  . Eus N/A 10/05/2013    Procedure: LOWER ENDOSCOPIC ULTRASOUND (EUS);  Surgeon: [name], MD;  Location: [name];  Service: Endoscopy;  Laterality: N/A;  . Flexible sigmoidoscopy N/A 10/05/2013    Procedure: FLEXIBLE SIGMOIDOSCOPY;  Surgeon: [name], MD;  Location: [facility];  Service: Endoscopy;  Laterality: N/A;    Family History  Problem Relation Age of Onset  . Cancer Maternal Uncle     Throat Cancer  . Cancer Paternal Uncle     Colon Cancer   maternal aunt-"stomach "cancer One brother, 2 sisters, one daughter-no other family history of cancer  Current outpatient prescriptions:bisacodyl (BISACODYL) 5 MG EC tablet, Take 10 mg by mouth 2 (two) times daily., Disp: , Rfl: ;  docusate sodium (COLACE) 100 MG capsule, Take 1 capsule (100 mg total) by mouth 2 (two) times daily., Disp: 60 capsule, Rfl: 0;  HYDROcodone-acetaminophen (NORCO) 5-325 MG per tablet, Take 1 tablet by mouth every 6 (six) hours as needed for moderate pain., Disp: 60 tablet, Rfl: 0  Allergies:  Allergies  Allergen Reactions  . Percocet [Oxycodone-Acetaminophen] Other (See Comments)    MAKES PATIENT ANXIOUS & HYPER    Social History: He works as a Air traffic controller. He has smoked for 20 years and currently smokes less  than one half pack of cigarettes per day. No alcohol use. No transfusion history. No risk factor for HIV or hepatitis.    ROS:   Positives include: Bleeding with bowel movements, pain with bowel movements, constipation, rectal pain when sitting  A complete ROS was otherwise negative.  Physical Exam:  Blood pressure 142/90, pulse 94, temperature 97.7 F (36.5 C), temperature source Oral, resp. rate 17, height 5' 7.5" (1.715 m), weight 249 lb 11.2 oz (113.263 kg), SpO2 93.00%.  HEENT:  Oropharynx without visible mass, neck without mass Lungs: Clear bilaterally Cardiac: Regular rate and rhythm Abdomen: Obese, no hepatomegaly, nontender, no mass GU: The left testicle is smaller than the right, no mass  Vascular: No leg edema Lymph nodes: No cervical, supraclavicular, axillary, or inguinal nodes Neurologic: Alert and oriented, the motor exam appears intact in the upper and lower extremities Skin: Scarring at the pretibial area bilaterally Musculoskeletal: Tender at the lumbar spine surrounding a surgical scar, no mass or rash   LAB: CEA 5.8 on 09/27/2013    Radiology: As per history of present illness    Assessment/Plan:   1. Clinical stage III (uT3,uN1) adenocarcinoma of the rectum  2. Indeterminate 3 mm right upper lobe nodule on a staging chest CT one to 2015  3. Rectal pain, constipation, and bleeding secondary to #1  4. Ongoing tobacco use   Disposition:   [name] has been diagnosed with locally advanced rectal cancer. I discussed the diagnosis and treatment options with him today. I explained the rationale for neoadjuvant chemotherapy and radiation. I recommend concurrent capecitabine and radiation to be followed by surgical resection. He will likely be a candidate for additional chemotherapy based on the final surgical pathology.  I encouraged him to discontinue smoking.  We discussed the potential toxicities associated with capecitabine including the chance for nausea, mucositis, diarrhea, and hematologic toxicity. We discussed the rash, hyperpigmentation, and hand/foot syndrome associated with capecitabine. He understands the potential for skin toxicity at the groin/perineum with combined radiation and chemotherapy. He will attend a chemotherapy teaching class.  He will begin a stool softener. We refilled hydrocodone.   He is scheduled to see Dr. Lisbeth Renshaw later this week. We will plan for a treatment start date of 10/31/2013. His case will be  presented at the GI tumor conference on 10/19/2013.  Approximately 50 minutes were spent with the patient today. The majority of time was used for counseling and coordination of care.  [place], [place] 10/19/2013, 1:06 PM

## 2013-10-19 NOTE — Progress Notes (Signed)
Faxed xeloda prescription to [facility] °

## 2013-10-20 ENCOUNTER — Ambulatory Visit: Payer: Managed Care, Other (non HMO)

## 2013-10-20 ENCOUNTER — Ambulatory Visit
Admission: RE | Admit: 2013-10-20 | Discharge: 2013-10-20 | Disposition: A | Discharge status: Home / Self Care | Payer: Managed Care, Other (non HMO) | Source: Ambulatory Visit | Attending: Radiation Oncology | Admitting: Radiation Oncology

## 2013-10-20 ENCOUNTER — Encounter: Payer: Self-pay | Admitting: Radiation Oncology

## 2013-10-20 VITALS — BP 146/80 | HR 92 | Temp 98.7°F | Resp 20 | Ht 67.5 in | Wt 250.4 lb

## 2013-10-20 DIAGNOSIS — F17200 Nicotine dependence, unspecified, uncomplicated: Secondary | ICD-10-CM | POA: Insufficient documentation

## 2013-10-20 DIAGNOSIS — I7090 Unspecified atherosclerosis: Secondary | ICD-10-CM | POA: Insufficient documentation

## 2013-10-20 DIAGNOSIS — G4730 Sleep apnea, unspecified: Secondary | ICD-10-CM | POA: Insufficient documentation

## 2013-10-20 DIAGNOSIS — C20 Malignant neoplasm of rectum: Secondary | ICD-10-CM | POA: Insufficient documentation

## 2013-10-20 HISTORY — DX: Allergy, unspecified, initial encounter: T78.40XA

## 2013-10-20 HISTORY — DX: Malignant (primary) neoplasm, unspecified: C80.1

## 2013-10-20 NOTE — Progress Notes (Signed)
Please see the Nurse Progress Note in the MD Initial Consult Encounter for this patient. 

## 2013-10-21 ENCOUNTER — Encounter: Payer: Self-pay | Admitting: Oncology

## 2013-10-21 ENCOUNTER — Other Ambulatory Visit: Payer: Managed Care, Other (non HMO)

## 2013-10-21 ENCOUNTER — Other Ambulatory Visit (HOSPITAL_BASED_OUTPATIENT_CLINIC_OR_DEPARTMENT_OTHER): Payer: Managed Care, Other (non HMO)

## 2013-10-21 DIAGNOSIS — C20 Malignant neoplasm of rectum: Secondary | ICD-10-CM

## 2013-10-21 LAB — COMPREHENSIVE METABOLIC PANEL (CC13)
ALT: 17 U/L (ref 0–55)
ANION GAP: 12 meq/L — AB (ref 3–11)
AST: 13 U/L (ref 5–34)
Albumin: 3.7 g/dL (ref 3.5–5.0)
Alkaline Phosphatase: 104 U/L (ref 40–150)
BILIRUBIN TOTAL: 0.31 mg/dL (ref 0.20–1.20)
BUN: 12.2 mg/dL (ref 7.0–26.0)
CALCIUM: 10 mg/dL (ref 8.4–10.4)
CO2: 24 meq/L (ref 22–29)
CREATININE: 0.8 mg/dL (ref 0.7–1.3)
Chloride: 104 mEq/L (ref 98–109)
Glucose: 101 mg/dl (ref 70–140)
Potassium: 4.3 mEq/L (ref 3.5–5.1)
Sodium: 140 mEq/L (ref 136–145)
TOTAL PROTEIN: 7.5 g/dL (ref 6.4–8.3)

## 2013-10-21 LAB — CBC WITH DIFFERENTIAL/PLATELET
BASO%: 0.8 % (ref 0.0–2.0)
Basophils Absolute: 0.1 10*3/uL (ref 0.0–0.1)
EOS ABS: 0.3 10*3/uL (ref 0.0–0.5)
EOS%: 2.9 % (ref 0.0–7.0)
HEMATOCRIT: 50.1 % — AB (ref 38.4–49.9)
HEMOGLOBIN: 17.4 g/dL — AB (ref 13.0–17.1)
LYMPH%: 28.3 % (ref 14.0–49.0)
MCH: 31.8 pg (ref 27.2–33.4)
MCHC: 34.8 g/dL (ref 32.0–36.0)
MCV: 91.5 fL (ref 79.3–98.0)
MONO#: 0.7 10*3/uL (ref 0.1–0.9)
MONO%: 7.3 % (ref 0.0–14.0)
NEUT%: 60.7 % (ref 39.0–75.0)
NEUTROS ABS: 5.7 10*3/uL (ref 1.5–6.5)
PLATELETS: 385 10*3/uL (ref 140–400)
RBC: 5.47 10*6/uL (ref 4.20–5.82)
RDW: 13.1 % (ref 11.0–14.6)
WBC: 9.4 10*3/uL (ref 4.0–10.3)
lymph#: 2.7 10*3/uL (ref 0.9–3.3)

## 2013-10-21 NOTE — Progress Notes (Addendum)
Radiation Oncology         (336) [phone] ________________________________  Name: Stephen Costa MRN: [id number]  Date: 10/20/2013  DOB: June 14, 1956  CC:SUN,Stephen Y, MD  Stephen Costa., MD   [name], M.D.  REFERRING PHYSICIAN: [name], MD   DIAGNOSIS: The encounter diagnosis was Rectal cancer.   HISTORY OF PRESENT ILLNESS::Stephen Costa is a 58 Costa.o. male who is seen for an initial consultation visit. The patient states that he has had some pain felt to be related to hemorrhoids for approximately 1-1/2 years. He indicates that his pain has progressed over the last several months and has been associated with constipation. He has pain now when sitting most of the time and this pain is increased with bowel movements. He also has noticed bleeding with bowel movements as well.  The patient underwent a colonoscopy. Digital rectal exam was normal. A partially obstructing mass was found in the rectosigmoid colon described at 10-15 cm proximal to the anus. Internal hemorrhoids were present. Pathology revealed superficial fragments of a tubulovillous adenoma.  The patient was then taken for an endoscopic ultrasound by Dr. [name]. He describes a firm fixed lesion palpated on digital exam. The mass extended from 7-13 cm from the anal verge. Multiple biopsies were obtained and the area was tattooed. On ultrasound, the tumor was staged as a T3 N1 tumor. The pathology confirmed invasive adenocarcinoma.  CT scans of the chest abdomen and pelvis have been completed. A nonspecific 3 Costa right upper lobe nodule was present. A 5 Costa regional lymph node was seen. No clear pathologically enlarged lymph nodes. Some asymmetric thickening of the rectum was noticed.  The patient has been seen by Dr. gross who indicates that he palpated a mass 8 cm from the anal verge. The patient has been felt to be a good candidate for possible neoadjuvant chemoradiotherapy. The patient has been seen by Dr. Benay Spice and  medical oncology and I have been asked to see the patient for consideration of possible radiation treatment.   PREVIOUS RADIATION THERAPY: No   PAST MEDICAL HISTORY:  has a past medical history of Rectal cancer (09/26/2013); Sleep apnea; Hemorrhoids; Cancer (10/05/13); Allergy; and Arthritis.     PAST SURGICAL HISTORY: Past Surgical History  Procedure Laterality Date  . Lumbar disc surgery  1984, 1987  . Wrist surgery Right 2006  . Shoulder surgery Right 1999  . Surgery scrotal / testicular Left 69 Costa/o    respositioning into scrotum  . Eus N/A 10/05/2013    Procedure: LOWER ENDOSCOPIC ULTRASOUND (EUS);  Surgeon: [name], MD;  Location: [name];  Service: Endoscopy;  Laterality: N/A;  . Flexible sigmoidoscopy N/A 10/05/2013    Procedure: FLEXIBLE SIGMOIDOSCOPY;  Surgeon: [name], MD;  Location: [facility];  Service: Endoscopy;  Laterality: N/A;     FAMILY HISTORY: family history includes Cancer in his maternal uncle and paternal uncle.   SOCIAL HISTORY:  reports that he has been smoking Cigarettes.  He has a 40 pack-year smoking history. He does not have any smokeless tobacco history on file. He reports that he does not drink alcohol or use illicit drugs.   ALLERGIES: Percocet   MEDICATIONS:  Current Outpatient Prescriptions  Medication Sig Dispense Refill  . docusate sodium (COLACE) 100 MG capsule Take 1 capsule (100 mg total) by mouth 2 (two) times daily.  60 capsule  0  . HYDROcodone-acetaminophen (NORCO) 5-325 MG per tablet Take 1 tablet by mouth every 6 (six) hours as needed for  moderate pain.  60 tablet  0  . bisacodyl (BISACODYL) 5 MG EC tablet Take 10 mg by mouth 2 (two) times daily.      . capecitabine (XELODA) 500 MG tablet Take 4 tablets (2,000 mg total) by mouth 2 (two) times daily after a meal. On days of radiation only (Mon-Fri)  224 tablet  0   No current facility-administered medications for this encounter.     REVIEW OF SYSTEMS:  A 15 point  review of systems is documented in the electronic medical record. This was obtained by the nursing staff. However, I reviewed this with the patient to discuss relevant findings and make appropriate changes.  Pertinent items are noted in HPI.    PHYSICAL EXAM:  height is 5' 7.5" (1.715 m) and weight is 250 lb 6.4 oz (113.581 kg). His oral temperature is 98.7 F (37.1 C). His blood pressure is 146/80 and his pulse is 92. His respiration is 20.   ECOG = 1  0 - Asymptomatic (Fully active, able to carry on all predisease activities without restriction)  1 - Symptomatic but completely ambulatory (Restricted in physically strenuous activity but ambulatory and able to carry out work of a light or sedentary nature. For example, light housework, office work)  2 - Symptomatic, <50% in bed during the day (Ambulatory and capable of all self care but unable to carry out any work activities. Up and about more than 50% of waking hours)  3 - Symptomatic, >50% in bed, but not bedbound (Capable of only limited self-care, confined to bed or chair 50% or more of waking hours)  4 - Bedbound (Completely disabled. Cannot carry on any self-care. Totally confined to bed or chair)  5 - Death   Stephen Costa, [name], [name], et al. [phone]). "Toxicity and response criteria of the [facility]". [place] Oncol. 5 (6): 649-55  General: Well-developed, in no acute distress HEENT: Normocephalic, atraumatic; oral cavity clear Neck: Supple without any lymphadenopathy Cardiovascular: Regular rate and rhythm Respiratory: Clear to auscultation bilaterally GI: Soft, nontender, normal bowel sounds Extremities: No edema present Neuro: No focal deficits Rectal:   A firm mass is palpated approximately 7 cm from the anal verge.  small amount of blood on exam glove    LABORATORY DATA:  No results found for this basename: WBC, HGB, HCT, MCV, PLT   No results found for this basename: NA, K, CL, CO2    No results found for this basename: ALT, AST, GGT, ALKPHOS, BILITOT      RADIOGRAPHY: Ct Chest W Contrast  09/30/2013   CLINICAL DATA:  New diagnosis of rectal carcinoma. Low abdominal pain with constipation. Smoker.  EXAM: CT CHEST, ABDOMEN, AND PELVIS WITH CONTRAST  TECHNIQUE: Multidetector CT imaging of the chest, abdomen and pelvis was performed following the Costa protocol during bolus administration of intravenous contrast.  CONTRAST:  11mL OMNIPAQUE IOHEXOL 300 MG/ML  SOLN  COMPARISON:  None.  FINDINGS: CT CHEST FINDINGS  No pathologically enlarged mediastinal, hilar or axillary lymph nodes. Mild atherosclerotic calcification of the arterial vasculature. Heart size normal. No pericardial effusion.  Mild centrilobular emphysema. Mild dependent atelectasis bilaterally. 3 Costa right upper lobe nodule (image 23), nonspecific. No pleural fluid. Airway is unremarkable.  CT ABDOMEN AND PELVIS FINDINGS  Liver appears decreased in attenuation diffusely. Liver, gallbladder, adrenal glands and right kidney are otherwise unremarkable. A sub cm low-attenuation lesion in the interpolar left kidney is too small to characterize but statistically, a cyst  is likely. Spleen, pancreas, stomach, small bowel and proximal colon are unremarkable. There appears to be eccentric thickening of the rectum (image 115), which may correspond to the patient's known rectal carcinoma. A 5 Costa short axis lymph node is seen adjacent to the left aspect of the rectum (image 111).  Atherosclerotic calcification of the arterial vasculature without abdominal aortic aneurysm. No pathologically enlarged lymph nodes. No free fluid. No worrisome lytic or sclerotic lesions. Degenerative changes are seen in the spine.  IMPRESSION: 1. Eccentric thickening of the rectal wall, likely corresponding to the patient's known rectal carcinoma. No distant metastatic disease. 2. Tiny right upper lobe nodule. Typically, for patients at increased risk of  bronchogenic carcinoma, a 1 year follow-up is recommended. However, in the setting of known malignancy, a shorter interval followup may be indicated. 3. Liver appears fatty.   Electronically Signed   By: Lorin Picket M.D.   On: 09/30/2013 12:54   Ct Abdomen Pelvis W Contrast  09/30/2013   CLINICAL DATA:  New diagnosis of rectal carcinoma. Low abdominal pain with constipation. Smoker.  EXAM: CT CHEST, ABDOMEN, AND PELVIS WITH CONTRAST  TECHNIQUE: Multidetector CT imaging of the chest, abdomen and pelvis was performed following the Costa protocol during bolus administration of intravenous contrast.  CONTRAST:  155mL OMNIPAQUE IOHEXOL 300 MG/ML  SOLN  COMPARISON:  None.  FINDINGS: CT CHEST FINDINGS  No pathologically enlarged mediastinal, hilar or axillary lymph nodes. Mild atherosclerotic calcification of the arterial vasculature. Heart size normal. No pericardial effusion.  Mild centrilobular emphysema. Mild dependent atelectasis bilaterally. 3 Costa right upper lobe nodule (image 23), nonspecific. No pleural fluid. Airway is unremarkable.  CT ABDOMEN AND PELVIS FINDINGS  Liver appears decreased in attenuation diffusely. Liver, gallbladder, adrenal glands and right kidney are otherwise unremarkable. A sub cm low-attenuation lesion in the interpolar left kidney is too small to characterize but statistically, a cyst is likely. Spleen, pancreas, stomach, small bowel and proximal colon are unremarkable. There appears to be eccentric thickening of the rectum (image 115), which may correspond to the patient's known rectal carcinoma. A 5 Costa short axis lymph node is seen adjacent to the left aspect of the rectum (image 111).  Atherosclerotic calcification of the arterial vasculature without abdominal aortic aneurysm. No pathologically enlarged lymph nodes. No free fluid. No worrisome lytic or sclerotic lesions. Degenerative changes are seen in the spine.  IMPRESSION: 1. Eccentric thickening of the rectal wall, likely  corresponding to the patient's known rectal carcinoma. No distant metastatic disease. 2. Tiny right upper lobe nodule. Typically, for patients at increased risk of bronchogenic carcinoma, a 1 year follow-up is recommended. However, in the setting of known malignancy, a shorter interval followup may be indicated. 3. Liver appears fatty.   Electronically Signed   By: Lorin Picket M.D.   On: 09/30/2013 12:54       IMPRESSION:  the patient has a new diagnosis of adenocarcinoma of the rectum. This represents a T3, N1, M0 tumor. A couple of small perirectal lymph nodes were seen on ultrasound and a small subcentimeter lymph node was also seen on CT imaging. No evidence of distant disease. The tumor extends to approximately 7 cm from the anal verge.  The patient is an appropriate candidate for neoadjuvant chemoradiotherapy. I discussed the role of preoperative chemoradiotherapy with the patient. We discussed the benefit in terms of local/regional control as well as decreasing the size of the tumor prior to surgical resection. We also discussed the possible side effects and risks  of treatment. All of the patient's questions were answered.  The patient does wish to proceed with chemoradiotherapy.   PLAN:  the patient will be scheduled for a simulation in the near future such that we can begin the treatment planning process. I anticipate beginning the patient's radiation treatment on 10/31/2013. The patient will receive 5-1/2 weeks of radiation treatment.     I spent 60 minutes face to face with the patient and more than 50% of that time was spent in counseling and/or coordination of care.    ________________________________   Jodelle Gross, MD, PhD

## 2013-10-21 NOTE — Progress Notes (Signed)
No dates for chemo/treatment  as of today.

## 2013-10-24 ENCOUNTER — Ambulatory Visit
Admission: RE | Admit: 2013-10-24 | Discharge: 2013-10-24 | Disposition: A | Discharge status: Home / Self Care | Payer: Managed Care, Other (non HMO) | Source: Ambulatory Visit | Attending: Radiation Oncology | Admitting: Radiation Oncology

## 2013-10-24 ENCOUNTER — Encounter: Payer: Self-pay | Admitting: Oncology

## 2013-10-24 ENCOUNTER — Encounter: Payer: Self-pay | Admitting: Nutrition

## 2013-10-24 ENCOUNTER — Encounter: Payer: Managed Care, Other (non HMO) | Admitting: Nutrition

## 2013-10-24 DIAGNOSIS — K6289 Other specified diseases of anus and rectum: Secondary | ICD-10-CM | POA: Insufficient documentation

## 2013-10-24 DIAGNOSIS — Z510 Encounter for antineoplastic radiation therapy: Secondary | ICD-10-CM | POA: Insufficient documentation

## 2013-10-24 DIAGNOSIS — R109 Unspecified abdominal pain: Secondary | ICD-10-CM | POA: Insufficient documentation

## 2013-10-24 DIAGNOSIS — Z79899 Other long term (current) drug therapy: Secondary | ICD-10-CM | POA: Insufficient documentation

## 2013-10-24 DIAGNOSIS — C20 Malignant neoplasm of rectum: Secondary | ICD-10-CM | POA: Insufficient documentation

## 2013-10-24 DIAGNOSIS — K921 Melena: Secondary | ICD-10-CM | POA: Insufficient documentation

## 2013-10-24 DIAGNOSIS — R42 Dizziness and giddiness: Secondary | ICD-10-CM | POA: Insufficient documentation

## 2013-10-24 DIAGNOSIS — R300 Dysuria: Secondary | ICD-10-CM | POA: Insufficient documentation

## 2013-10-24 DIAGNOSIS — K5900 Constipation, unspecified: Secondary | ICD-10-CM | POA: Insufficient documentation

## 2013-10-24 NOTE — Progress Notes (Signed)
Patient did not show up for nutrition appointment. 

## 2013-10-24 NOTE — Progress Notes (Signed)
[employer] approved xeloda from 10/24/13-10/24/14.

## 2013-10-25 ENCOUNTER — Other Ambulatory Visit: Payer: Self-pay | Admitting: *Deleted

## 2013-10-25 NOTE — Progress Notes (Signed)
  Radiation Oncology         [phone] ________________________________  Name: Stephen Costa MRN: [id number]  Date: 10/24/2013  DOB: [birth year]  SIMULATION AND TREATMENT PLANNING NOTE  The patient presented for simulation for the patient's upcoming course of preoperative radiation for the diagnosis of rectal cancer. The patient was placed in a supine position. A customized alpha cradle was constructed toaid in patient immobilization. This complex treatment device will be used on a daily basis during the treatment. In this fashion a CT scan was obtained through the pelvic region and the isocenter was placed near midline within the pelvis.  The patient will initially be planned to receive a course of radiation to a dose of 45 gray. This will be accomplished in 25 fractions at 1.8 gray per fraction. This initial treatment will correspond to a 3-D conformal technique. The gross tumor volume has been contoured in addition to the rectum, bladder and femoral heads. DVH's of each of these structures have been requested and these will be carefully reviewed as part of the 3-D conformal treatment planning process. To accomplish this initial treatment, 4 customized blocks have been designed for this purpose. Each of these 4 complex treatment devices will be used on a daily basis during the initial course of his treatment. It is anticipated that the patient will then receive a boost for an additional 5.4 gray. The anticipated total dose therefore will be 50.4 gray.  Special treatment procedure The patient will receive chemotherapy during the course of radiation treatment. The patient may experience increased or overlapping toxicity due to this combined-modality approach and the patient will be monitored for such problems. This may include extra lab work as necessary. This therefore constitutes a special treatment procedure.    ________________________________  Jodelle Gross, MD, PhD

## 2013-10-25 NOTE — Telephone Encounter (Signed)
Received prescription request from [facility] for patient's Xeloda. Request give to Dr Gearldine Shown nurse

## 2013-10-25 NOTE — Addendum Note (Signed)
Encounter addended by: Marye Round, MD on: 10/25/2013  6:53 AM<BR>     Documentation filed: Notes Section

## 2013-10-27 ENCOUNTER — Telehealth: Payer: Self-pay | Admitting: *Deleted

## 2013-10-27 NOTE — Telephone Encounter (Signed)
Reports he has #160 pills of Xeloda on hand and had to pay $2500 out of pocket for medication. Thought he was only to take #1 tablet bid and this would be all he needed. Reviewed proper dosing with him and made him aware that he will be taking #4 tabs twice daily = 8/day. To complete his RT, he will need to refill later for #64 tabs. He expressed concern regarding ability to pay this copay again. Made him aware that I will follow up with [facility] to determine if there is any manufacture assistance.  Spoke with representative at [facility] to provide "retro" assistance, since patient agreed to make the payment, but they will fax application for assistance for the next quantity needed to complete his radiation. Confirmed that his insurance does list [facility] as a in network provider. His cost was so high due to his deductible and insurance only pays 20% of the chemo.

## 2013-10-27 NOTE — Telephone Encounter (Signed)
RECEIVED A FAX FROM [facility] CONCERNING A CONFIRMATION OF PRESCRIPTION SHIPMENT FOR CAPECITABINE ON 10/26/13.

## 2013-10-27 NOTE — Telephone Encounter (Signed)
Stephen Costa called to inquire if the refill request for his Xeloda had been received? Confirmed it has, but is not needed. We are using [facility] for his Xeloda, which is covered under his insurance plan also. They will update records with this information.

## 2013-10-31 ENCOUNTER — Telehealth: Payer: Self-pay | Admitting: Nutrition

## 2013-10-31 ENCOUNTER — Ambulatory Visit
Admission: RE | Admit: 2013-10-31 | Discharge: 2013-10-31 | Disposition: A | Discharge status: Home / Self Care | Payer: Managed Care, Other (non HMO) | Source: Ambulatory Visit | Attending: Radiation Oncology | Admitting: Radiation Oncology

## 2013-10-31 ENCOUNTER — Telehealth: Payer: Self-pay | Admitting: *Deleted

## 2013-10-31 DIAGNOSIS — C20 Malignant neoplasm of rectum: Secondary | ICD-10-CM

## 2013-10-31 NOTE — Progress Notes (Signed)
  Radiation Oncology         [phone] ________________________________  Name: Stephen Costa MRN: [id number]  Date: 10/31/2013  DOB: Sep 24, 1956  Simulation Verification Note   NARRATIVE: The patient was brought to the treatment unit and placed in the planned treatment position. The clinical setup was verified. Then port films were obtained and uploaded to the radiation oncology medical record software.  The treatment beams were carefully compared against the planned radiation fields. The position, location, and shape of the radiation fields was reviewed. The targeted volume of tissue appears to be appropriately covered by the radiation beams. Based on my personal review, I approved the simulation verification. The patient's treatment will proceed as planned.  ________________________________   Jodelle Gross, MD, PhD

## 2013-10-31 NOTE — Telephone Encounter (Signed)
Message from [facility] in radiation requesting I call pt to discuss his Xeloda Rx. Called pt, he is frustrated with the cost of Xeloda from [facility]. Stated he called local pharmacies and the estimated cost for his Xeloda was thousands cheaper than what he paid for 20 day supply at [facility]. Pt's wife states she found coupons online for Xeloda, asks that we review the information and order remaining Rx from a less expensive pharmacy. They will bring in info on 11/01/13.  Received fax from [facility] with patient assistance forms. Forms given to managed care.

## 2013-10-31 NOTE — Telephone Encounter (Signed)
Received a request to call patient to schedule nutrition appointment.  Patient was scheduled for nutrition appointment last week but missed his appointment secondary to a family illness.  I called patient and had leave a message for him to return my call, but I will help him schedule an appointment at his convenience.

## 2013-11-01 ENCOUNTER — Ambulatory Visit
Admission: RE | Admit: 2013-11-01 | Discharge: 2013-11-01 | Disposition: A | Discharge status: Home / Self Care | Payer: Managed Care, Other (non HMO) | Source: Ambulatory Visit | Attending: Radiation Oncology | Admitting: Radiation Oncology

## 2013-11-02 ENCOUNTER — Ambulatory Visit
Admission: RE | Admit: 2013-11-02 | Discharge: 2013-11-02 | Disposition: A | Discharge status: Home / Self Care | Payer: Managed Care, Other (non HMO) | Source: Ambulatory Visit | Attending: Radiation Oncology | Admitting: Radiation Oncology

## 2013-11-02 ENCOUNTER — Encounter (INDEPENDENT_AMBULATORY_CARE_PROVIDER_SITE_OTHER): Payer: Self-pay

## 2013-11-03 ENCOUNTER — Ambulatory Visit
Admission: RE | Admit: 2013-11-03 | Discharge: 2013-11-03 | Disposition: A | Discharge status: Home / Self Care | Payer: Managed Care, Other (non HMO) | Source: Ambulatory Visit | Attending: Radiation Oncology | Admitting: Radiation Oncology

## 2013-11-04 ENCOUNTER — Ambulatory Visit
Admission: RE | Admit: 2013-11-04 | Discharge: 2013-11-04 | Disposition: A | Discharge status: Home / Self Care | Payer: Managed Care, Other (non HMO) | Source: Ambulatory Visit | Attending: Radiation Oncology | Admitting: Radiation Oncology

## 2013-11-04 ENCOUNTER — Other Ambulatory Visit: Payer: Self-pay | Admitting: *Deleted

## 2013-11-04 ENCOUNTER — Encounter: Payer: Self-pay | Admitting: Radiation Oncology

## 2013-11-04 VITALS — BP 112/75 | HR 94 | Temp 98.2°F | Resp 20 | Wt 245.2 lb

## 2013-11-04 DIAGNOSIS — C19 Malignant neoplasm of rectosigmoid junction: Secondary | ICD-10-CM

## 2013-11-04 DIAGNOSIS — C20 Malignant neoplasm of rectum: Secondary | ICD-10-CM

## 2013-11-04 MED ORDER — HYDROCODONE-ACETAMINOPHEN 5-325 MG PO TABS: 1.0000 | ORAL_TABLET | Freq: Four times a day (QID) | ORAL | Status: DC | PRN

## 2013-11-04 NOTE — Telephone Encounter (Signed)
VM requesting to pick up pain med refill script at 11:00 today when here for radiation.

## 2013-11-04 NOTE — Progress Notes (Signed)
Weekly rad txs rectal 5/25 completed, had big bm yesterday, no pain in stomach, eationg okay, drinking protein shakes, no nausea, no bladder problems, pt education, already had rad txs, offered sitz bath,  Declined uses bathtub soaks, has baby wipes, gave rad book , discussed side effects, pain, n,v,d, skin irritation,  Fatigue, not using colace request pain med hydrocodone 5/325mg  11:37 AM

## 2013-11-04 NOTE — Progress Notes (Signed)
   Department of Radiation Oncology  Phone:  [phone] Fax:        [phone]  Weekly Treatment Note    Name: Stephen Costa Date: 11/04/2013 MRN: [id number] DOB: August 05, 1956   Current dose: 9 Gy  Current fraction: 5   MEDICATIONS: Current Outpatient Prescriptions  Medication Sig Dispense Refill  . bisacodyl (BISACODYL) 5 MG EC tablet Take 10 mg by mouth 2 (two) times daily.      . capecitabine (XELODA) 500 MG tablet Take 4 tablets (2,000 mg total) by mouth 2 (two) times daily after a meal. On days of radiation only (Mon-Fri)  224 tablet  0  . HYDROcodone-acetaminophen (NORCO) 5-325 MG per tablet Take 1 tablet by mouth every 6 (six) hours as needed for moderate pain.  60 tablet  0  . docusate sodium (COLACE) 100 MG capsule Take 1 capsule (100 mg total) by mouth 2 (two) times daily.  60 capsule  0   No current facility-administered medications for this encounter.     ALLERGIES: Percocet   LABORATORY DATA:  Lab Results  Component Value Date   WBC 9.4 10/21/2013   HGB 17.4* 10/21/2013   HCT 50.1* 10/21/2013   MCV 91.5 10/21/2013   PLT 385 10/21/2013   Lab Results  Component Value Date   NA 140 10/21/2013   K 4.3 10/21/2013   CO2 24 10/21/2013   Lab Results  Component Value Date   ALT 17 10/21/2013   AST 13 10/21/2013   ALKPHOS 104 10/21/2013   BILITOT 0.31 10/21/2013     NARRATIVE: Stephen Costa was seen today for weekly treatment management. The chart was checked and the patient's films were reviewed. The patient is doing well after his first week of treatment. No nausea. No skin irritation at this point. Education has been provided.  PHYSICAL EXAMINATION: weight is 245 lb 3.2 oz (111.222 kg). His oral temperature is 98.2 F (36.8 C). His blood pressure is 112/75 and his pulse is 94. His respiration is 20.        ASSESSMENT: The patient is doing satisfactorily with treatment.  PLAN: We will continue with the patient's radiation treatment as planned.

## 2013-11-07 ENCOUNTER — Ambulatory Visit
Admission: RE | Admit: 2013-11-07 | Discharge: 2013-11-07 | Disposition: A | Discharge status: Home / Self Care | Payer: Managed Care, Other (non HMO) | Source: Ambulatory Visit | Attending: Radiation Oncology | Admitting: Radiation Oncology

## 2013-11-07 ENCOUNTER — Encounter: Payer: Self-pay | Admitting: Oncology

## 2013-11-07 NOTE — Progress Notes (Signed)
Put [employer] disability form on nurse's desk. °

## 2013-11-08 ENCOUNTER — Ambulatory Visit
Admission: RE | Admit: 2013-11-08 | Discharge: 2013-11-08 | Disposition: A | Discharge status: Home / Self Care | Payer: Managed Care, Other (non HMO) | Source: Ambulatory Visit | Attending: Radiation Oncology | Admitting: Radiation Oncology

## 2013-11-09 ENCOUNTER — Ambulatory Visit
Admission: RE | Admit: 2013-11-09 | Discharge: 2013-11-09 | Disposition: A | Discharge status: Home / Self Care | Payer: Managed Care, Other (non HMO) | Source: Ambulatory Visit | Attending: Radiation Oncology | Admitting: Radiation Oncology

## 2013-11-09 ENCOUNTER — Encounter: Payer: Self-pay | Admitting: Oncology

## 2013-11-09 NOTE — Progress Notes (Signed)
Faxed disability form to [employer] @ [phone] °

## 2013-11-10 ENCOUNTER — Ambulatory Visit
Admission: RE | Admit: 2013-11-10 | Discharge: 2013-11-10 | Disposition: A | Discharge status: Home / Self Care | Payer: Managed Care, Other (non HMO) | Source: Ambulatory Visit | Attending: Radiation Oncology | Admitting: Radiation Oncology

## 2013-11-11 ENCOUNTER — Ambulatory Visit
Admission: RE | Admit: 2013-11-11 | Discharge: 2013-11-11 | Disposition: A | Discharge status: Home / Self Care | Payer: Managed Care, Other (non HMO) | Source: Ambulatory Visit | Attending: Radiation Oncology | Admitting: Radiation Oncology

## 2013-11-11 ENCOUNTER — Ambulatory Visit (HOSPITAL_BASED_OUTPATIENT_CLINIC_OR_DEPARTMENT_OTHER): Payer: Managed Care, Other (non HMO) | Admitting: Nurse Practitioner

## 2013-11-11 ENCOUNTER — Telehealth: Payer: Self-pay | Admitting: Oncology

## 2013-11-11 ENCOUNTER — Other Ambulatory Visit (HOSPITAL_BASED_OUTPATIENT_CLINIC_OR_DEPARTMENT_OTHER): Payer: Managed Care, Other (non HMO)

## 2013-11-11 VITALS — BP 138/82 | HR 111 | Temp 97.7°F | Resp 17 | Ht 67.5 in | Wt 244.6 lb

## 2013-11-11 VITALS — BP 136/81 | HR 112 | Temp 97.6°F | Ht 67.5 in | Wt 245.7 lb

## 2013-11-11 DIAGNOSIS — G893 Neoplasm related pain (acute) (chronic): Secondary | ICD-10-CM

## 2013-11-11 DIAGNOSIS — C20 Malignant neoplasm of rectum: Secondary | ICD-10-CM

## 2013-11-11 DIAGNOSIS — F17200 Nicotine dependence, unspecified, uncomplicated: Secondary | ICD-10-CM

## 2013-11-11 LAB — CBC WITH DIFFERENTIAL/PLATELET
BASO%: 0.1 % (ref 0.0–2.0)
Basophils Absolute: 0 10*3/uL (ref 0.0–0.1)
EOS%: 4 % (ref 0.0–7.0)
Eosinophils Absolute: 0.3 10*3/uL (ref 0.0–0.5)
HCT: 46.6 % (ref 38.4–49.9)
HEMOGLOBIN: 16.5 g/dL (ref 13.0–17.1)
LYMPH#: 1.2 10*3/uL (ref 0.9–3.3)
LYMPH%: 16.7 % (ref 14.0–49.0)
MCH: 31.4 pg (ref 27.2–33.4)
MCHC: 35.4 g/dL (ref 32.0–36.0)
MCV: 88.8 fL (ref 79.3–98.0)
MONO#: 0.7 10*3/uL (ref 0.1–0.9)
MONO%: 9.4 % (ref 0.0–14.0)
NEUT#: 5.1 10*3/uL (ref 1.5–6.5)
NEUT%: 69.8 % (ref 39.0–75.0)
Platelets: 300 10*3/uL (ref 140–400)
RBC: 5.25 10*6/uL (ref 4.20–5.82)
RDW: 12.7 % (ref 11.0–14.6)
WBC: 7.2 10*3/uL (ref 4.0–10.3)

## 2013-11-11 NOTE — Progress Notes (Signed)
Stephen Costa has had 10 fractions to his pelvis/rectum.  He denies pain.  He does have discomfort after bowel movements but says it is better than before.  He is taking norco 1 tablet q 6 hours.  He reports his abdomen is tender.  He denies nausea.  He reports a poor appetite.  His weight is steady this week.  He reports feeling dizzy and lightheaded after taking his [place].  He reports having a hard time sleeping.  He only stays asleep for an hour at time.  He denies diarrhea.  He denies skin irritation.

## 2013-11-11 NOTE — Telephone Encounter (Signed)
gv and printed appt sched and avs for pt for Feb  °

## 2013-11-11 NOTE — Progress Notes (Signed)
OFFICE PROGRESS NOTE  Interval history:  Stephen Costa returns for followup of rectal cancer. He began radiation and Xeloda on 10/31/2013. He has periodic mild nausea. No vomiting. No mouth sores. He denies diarrhea. No hand or foot pain or redness. He notes less pain and bleeding with bowel movements. He continues Norco as needed. He continues to smoke. He reports difficulty sleeping.   Objective: Filed Vitals:   11/11/13 1103  BP: 138/82  Pulse: 111  Temp: 97.7 F (36.5 C)  Resp: 17   repeat heart rate 102.  No thrush or ulcerations. Lungs are clear. Regular cardiac rhythm. Abdomen soft and nontender. Obese. No hepatomegaly. No leg edema. Palms and soles mildly erythematous. No skin breakdown.   Lab Results: Lab Results  Component Value Date   WBC 7.2 11/11/2013   HGB 16.5 11/11/2013   HCT 46.6 11/11/2013   MCV 88.8 11/11/2013   PLT 300 11/11/2013   NEUTROABS 5.1 11/11/2013    Chemistry:    Chemistry      Component Value Date/Time   NA 140 10/21/2013 1048   K 4.3 10/21/2013 1048   CO2 24 10/21/2013 1048   BUN 12.2 10/21/2013 1048   CREATININE 0.8 10/21/2013 1048      Component Value Date/Time   CALCIUM 10.0 10/21/2013 1048   ALKPHOS 104 10/21/2013 1048   AST 13 10/21/2013 1048   ALT 17 10/21/2013 1048   BILITOT 0.31 10/21/2013 1048       Studies/Results: No results found.  Medications: I have reviewed the patient's current medications.  Assessment/Plan: 1. 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   Initiation of radiation and Xeloda 10/31/2013. 2. Indeterminate 3 mm right upper lobe nodule on staging chest CT 09/30/2013. 3. Rectal pain, constipation and bleeding secondary to #1. Improved. 4. Ongoing tobacco use. 5. Sleep disturbance. He will try Benadryl.   Dispositon-he appears stable. He continues radiation and Xeloda. We will see him in followup in 2 weeks. He will contact the office in the interim with any problems. We specifically discussed  symptoms of hand-foot syndrome.   Ned Card ANP/GNP-BC

## 2013-11-11 NOTE — Progress Notes (Signed)
   Department of Radiation Oncology  Phone:  [phone] Fax:        [phone]  Weekly Treatment Note    Name: Stephen Costa Date: 11/11/2013 MRN: [id number] DOB: 06-07-1956   Current dose: 18 Gy  Current fraction: 10   MEDICATIONS: Current Outpatient Prescriptions  Medication Sig Dispense Refill  . capecitabine (XELODA) 500 MG tablet Take 4 tablets (2,000 mg total) by mouth 2 (two) times daily after a meal. On days of radiation only (Mon-Fri)  224 tablet  0  . HYDROcodone-acetaminophen (NORCO) 5-325 MG per tablet Take 1 tablet by mouth every 6 (six) hours as needed for moderate pain.  60 tablet  0  . bisacodyl (BISACODYL) 5 MG EC tablet Take 10 mg by mouth 2 (two) times daily.      Marland Kitchen docusate sodium (COLACE) 100 MG capsule Take 1 capsule (100 mg total) by mouth 2 (two) times daily.  60 capsule  0   No current facility-administered medications for this encounter.     ALLERGIES: Percocet   LABORATORY DATA:  Lab Results  Component Value Date   WBC 9.4 10/21/2013   HGB 17.4* 10/21/2013   HCT 50.1* 10/21/2013   MCV 91.5 10/21/2013   PLT 385 10/21/2013   Lab Results  Component Value Date   NA 140 10/21/2013   K 4.3 10/21/2013   CO2 24 10/21/2013   Lab Results  Component Value Date   ALT 17 10/21/2013   AST 13 10/21/2013   ALKPHOS 104 10/21/2013   BILITOT 0.31 10/21/2013     NARRATIVE: Stephen Costa was seen today for weekly treatment management. The chart was checked and the patient's films were reviewed. The patient is doing well after having completed his second week of treatment. Continued improved bowel movements. No significant skin irritation/dysuria. The patient does note some occasional dizziness after Xeloda. He states that he will discuss this later today with medical oncology at his appointment.  PHYSICAL EXAMINATION: height is 5' 7.5" (1.715 m) and weight is 245 lb 11.2 oz (111.449 kg). His temperature is 97.6 F (36.4 C). His blood pressure is 136/81 and his  pulse is 112.        ASSESSMENT: The patient is doing satisfactorily with treatment.  PLAN: We will continue with the patient's radiation treatment as planned.

## 2013-11-14 ENCOUNTER — Ambulatory Visit
Admission: RE | Admit: 2013-11-14 | Discharge: 2013-11-14 | Disposition: A | Discharge status: Home / Self Care | Payer: Managed Care, Other (non HMO) | Source: Ambulatory Visit | Attending: Radiation Oncology | Admitting: Radiation Oncology

## 2013-11-15 ENCOUNTER — Ambulatory Visit
Admission: RE | Admit: 2013-11-15 | Discharge: 2013-11-15 | Disposition: A | Discharge status: Home / Self Care | Payer: Managed Care, Other (non HMO) | Source: Ambulatory Visit | Attending: Radiation Oncology | Admitting: Radiation Oncology

## 2013-11-16 ENCOUNTER — Ambulatory Visit
Admission: RE | Admit: 2013-11-16 | Discharge: 2013-11-16 | Disposition: A | Discharge status: Home / Self Care | Payer: Managed Care, Other (non HMO) | Source: Ambulatory Visit | Attending: Radiation Oncology | Admitting: Radiation Oncology

## 2013-11-17 ENCOUNTER — Ambulatory Visit: Payer: Managed Care, Other (non HMO)

## 2013-11-17 ENCOUNTER — Encounter (INDEPENDENT_AMBULATORY_CARE_PROVIDER_SITE_OTHER): Payer: Self-pay

## 2013-11-18 ENCOUNTER — Ambulatory Visit
Admission: RE | Admit: 2013-11-18 | Discharge: 2013-11-18 | Disposition: A | Discharge status: Home / Self Care | Payer: Managed Care, Other (non HMO) | Source: Ambulatory Visit | Attending: Radiation Oncology | Admitting: Radiation Oncology

## 2013-11-18 DIAGNOSIS — C20 Malignant neoplasm of rectum: Secondary | ICD-10-CM

## 2013-11-21 ENCOUNTER — Telehealth: Payer: Self-pay | Admitting: *Deleted

## 2013-11-21 ENCOUNTER — Other Ambulatory Visit: Payer: Self-pay | Admitting: *Deleted

## 2013-11-21 ENCOUNTER — Ambulatory Visit
Admission: RE | Admit: 2013-11-21 | Discharge: 2013-11-21 | Disposition: A | Discharge status: Home / Self Care | Payer: Managed Care, Other (non HMO) | Source: Ambulatory Visit | Attending: Radiation Oncology | Admitting: Radiation Oncology

## 2013-11-21 ENCOUNTER — Encounter: Payer: Self-pay | Admitting: Radiation Oncology

## 2013-11-21 VITALS — BP 126/84 | HR 108 | Resp 16 | Wt 248.0 lb

## 2013-11-21 DIAGNOSIS — C20 Malignant neoplasm of rectum: Secondary | ICD-10-CM

## 2013-11-21 MED ORDER — CAPECITABINE 500 MG PO TABS: 2000.0000 mg | ORAL_TABLET | Freq: Two times a day (BID) | ORAL | Status: DC

## 2013-11-21 NOTE — Telephone Encounter (Signed)
THIS REFILL REQUEST FOR CAPECITABINE WAS GIVEN TO DR.SHERRILL'S NURSE, TANYA WHITLOCK,RN. 

## 2013-11-21 NOTE — Progress Notes (Signed)
  Radiation Oncology         ([phone]) [phone] ________________________________  Name: Stephen Costa MRN: [id number]  Date: 11/21/2013  DOB: 03/13/1956  Weekly Radiation Therapy Management  Current Dose: 27 Gy     Planned Dose:  45 Gy  Narrative . . . . . . . . The patient presents for routine under treatment assessment.  Reports rectal soreness 5 on a scale of 0-10. Reports that on Thursday he rode a long distance with his girlfriend since the machine was down and knows now he shouldn't have done that. Reports that over the weekend he struggled with stomach cramps and constipation. Reports scant blood in stool over the weekend from straining to defecate. Reports that he finally had a formed bowel movement this morning. Denies nausea and vomiting. Reports stomach cramps have resolved. Reports dysuria                                   The patient is without complaint.                                 Set-up films were reviewed.                                 The chart was checked. Physical Findings. . .  weight is 248 lb (112.492 kg). His blood pressure is 126/84 and his pulse is 108. His respiration is 16. . Weight essentially stable.  No significant changes. Impression . . . . . . . The patient is tolerating radiation. Plan . . . . . . . . . . . . Continue treatment as planned.  ________________________________  Sheral Apley. Tammi Klippel, M.D.

## 2013-11-21 NOTE — Telephone Encounter (Signed)
Received message from patient stating he needs a refill on his Xeloda, that he is going to run out by the end of this week.  Message was sent to Dr. Gearldine Shown nurse.

## 2013-11-21 NOTE — Telephone Encounter (Signed)
Message from pt requesting 8 day refill on Xeloda to finish chemoradiation therapy. Confirmed with [facility] that only 20 days has been sent to pt. Rx faxed to [facility]. Left message on voicemail informing pt.

## 2013-11-21 NOTE — Progress Notes (Signed)
Reports rectal soreness 5 on a scale of 0-10. Reports that on Thursday he rode a long distance with his girlfriend since the machine was down and knows now he shouldn't have done that. Reports that over the weekend he struggled with stomach cramps and constipation. Reports scant blood in stool over the weekend from straining to defecate. Reports that he finally had a formed bowel movement this morning. Denies nausea and vomiting. Reports stomach cramps have resolved. Reports dysuria.

## 2013-11-22 ENCOUNTER — Ambulatory Visit
Admission: RE | Admit: 2013-11-22 | Discharge: 2013-11-22 | Disposition: A | Discharge status: Home / Self Care | Payer: Managed Care, Other (non HMO) | Source: Ambulatory Visit | Attending: Radiation Oncology | Admitting: Radiation Oncology

## 2013-11-23 ENCOUNTER — Ambulatory Visit
Admission: RE | Admit: 2013-11-23 | Discharge: 2013-11-23 | Disposition: A | Discharge status: Home / Self Care | Payer: Managed Care, Other (non HMO) | Source: Ambulatory Visit | Attending: Radiation Oncology | Admitting: Radiation Oncology

## 2013-11-24 ENCOUNTER — Ambulatory Visit
Admission: RE | Admit: 2013-11-24 | Discharge: 2013-11-24 | Disposition: A | Discharge status: Home / Self Care | Payer: Managed Care, Other (non HMO) | Source: Ambulatory Visit | Attending: Radiation Oncology | Admitting: Radiation Oncology

## 2013-11-24 ENCOUNTER — Telehealth: Payer: Self-pay | Admitting: *Deleted

## 2013-11-24 NOTE — Telephone Encounter (Signed)
Pt came in to office to follow up on Xeloda Rx, called [facility]. They received fax on 2/23, they are not in the office today and may not be on 2/27 due to the weather. They will contact pt to arrange shipment of medication.Should receive delivery Mon or Tues, per [place]. Informed pt, he voiced frustration with delay. He will complete his supply on 2/27.

## 2013-11-25 ENCOUNTER — Ambulatory Visit
Admission: RE | Admit: 2013-11-25 | Discharge: 2013-11-25 | Disposition: A | Discharge status: Home / Self Care | Payer: Managed Care, Other (non HMO) | Source: Ambulatory Visit | Attending: Radiation Oncology | Admitting: Radiation Oncology

## 2013-11-25 ENCOUNTER — Telehealth: Payer: Self-pay | Admitting: Oncology

## 2013-11-25 ENCOUNTER — Other Ambulatory Visit (HOSPITAL_BASED_OUTPATIENT_CLINIC_OR_DEPARTMENT_OTHER): Payer: Managed Care, Other (non HMO)

## 2013-11-25 ENCOUNTER — Ambulatory Visit (HOSPITAL_BASED_OUTPATIENT_CLINIC_OR_DEPARTMENT_OTHER): Payer: Managed Care, Other (non HMO) | Admitting: Oncology

## 2013-11-25 ENCOUNTER — Encounter: Payer: Self-pay | Admitting: Radiation Oncology

## 2013-11-25 VITALS — BP 146/81 | HR 108 | Temp 98.2°F | Resp 20 | Wt 247.8 lb

## 2013-11-25 VITALS — BP 160/90 | HR 103 | Temp 97.8°F | Resp 18

## 2013-11-25 DIAGNOSIS — F17200 Nicotine dependence, unspecified, uncomplicated: Secondary | ICD-10-CM

## 2013-11-25 DIAGNOSIS — R911 Solitary pulmonary nodule: Secondary | ICD-10-CM

## 2013-11-25 DIAGNOSIS — C20 Malignant neoplasm of rectum: Secondary | ICD-10-CM

## 2013-11-25 DIAGNOSIS — K6289 Other specified diseases of anus and rectum: Secondary | ICD-10-CM

## 2013-11-25 DIAGNOSIS — C19 Malignant neoplasm of rectosigmoid junction: Secondary | ICD-10-CM

## 2013-11-25 LAB — CBC WITH DIFFERENTIAL/PLATELET
BASO%: 0.3 % (ref 0.0–2.0)
BASOS ABS: 0 10*3/uL (ref 0.0–0.1)
EOS%: 4.3 % (ref 0.0–7.0)
Eosinophils Absolute: 0.3 10*3/uL (ref 0.0–0.5)
HCT: 48.3 % (ref 38.4–49.9)
HGB: 16.8 g/dL (ref 13.0–17.1)
LYMPH#: 0.8 10*3/uL — AB (ref 0.9–3.3)
LYMPH%: 12.3 % — ABNORMAL LOW (ref 14.0–49.0)
MCH: 32.3 pg (ref 27.2–33.4)
MCHC: 34.7 g/dL (ref 32.0–36.0)
MCV: 93 fL (ref 79.3–98.0)
MONO#: 0.8 10*3/uL (ref 0.1–0.9)
MONO%: 12.1 % (ref 0.0–14.0)
NEUT#: 4.7 10*3/uL (ref 1.5–6.5)
NEUT%: 71 % (ref 39.0–75.0)
Platelets: 288 10*3/uL (ref 140–400)
RBC: 5.19 10*6/uL (ref 4.20–5.82)
RDW: 13.9 % (ref 11.0–14.6)
WBC: 6.6 10*3/uL (ref 4.0–10.3)

## 2013-11-25 MED ORDER — PROCHLORPERAZINE MALEATE 10 MG PO TABS: 10.0000 mg | ORAL_TABLET | Freq: Four times a day (QID) | ORAL | Status: DC | PRN

## 2013-11-25 MED ORDER — HYDROCODONE-ACETAMINOPHEN 5-325 MG PO TABS: 1.0000 | ORAL_TABLET | Freq: Four times a day (QID) | ORAL | Status: DC | PRN

## 2013-11-25 NOTE — Progress Notes (Signed)
   [facility]    OFFICE PROGRESS NOTE   INTERVAL HISTORY:   Stephen Costa returns for scheduled followup of rectal cancer. He continues capecitabine and radiation. He reports tolerating the treatment well. No mouth sores, diarrhea, or hand/foot pain. Mild nausea. He has burning with urination. The rectal pain and bleeding have improved.  Objective:  Vital signs in last 24 hours:  Blood pressure 160/90, pulse 103, temperature 97.8 F (36.6 C), temperature source Oral, resp. rate 18, SpO2 96.00%.    HEENT: No thrush or ulcers Resp: Lungs clear bilaterally Cardio: Regular rate and rhythm GI: No hepatomegaly, nontender Vascular: No leg  Skin: Hyperpigmentation over the hands and soles with dryness. No erythema or skin breakdown. Hyperpigmentation at the perineum without skin breakdown.     Lab Results:  Lab Results  Component Value Date   WBC 6.6 11/25/2013   HGB 16.8 11/25/2013   HCT 48.3 11/25/2013   MCV 93.0 11/25/2013   PLT 288 11/25/2013   NEUTROABS 4.7 11/25/2013      Medications: I have reviewed the patient's current medications.  Assessment/Plan: 1. 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  Initiation of radiation and Xeloda 10/31/2013. 2. Indeterminate 3 mm right upper lobe nodule on staging chest CT 09/30/2013. 3. Rectal pain, constipation and bleeding secondary to #1. Improved. 4. Ongoing tobacco use.   Disposition:  Stephen Costa is tolerating the Xeloda and radiation well. He is scheduled to complete treatment on 12/08/2013. He will return for an office visit here on 12/16/2013. We will be sure he is scheduled to see Dr. Johney Maine for surgical planning.  Stephen Costa will contact us for new symptoms.   Betsy Coder, MD  11/25/2013  8:54 AM

## 2013-11-25 NOTE — Progress Notes (Signed)
error 

## 2013-11-25 NOTE — Progress Notes (Signed)
  Radiation Oncology         ([phone]) [phone] ________________________________  Name: Stephen Costa MRN: [id number]  Date: 11/25/2013  DOB: 15-Sep-1956  Weekly Radiation Therapy Management  Current Dose: 34.2 Gy     Planned Dose:  45 Gy  Narrative . . . . . . . . The patient presents for routine under treatment assessment.                                  Pt states he has dysuria, occasional rectal pain. He denies other urinary issues, bowel issues, nausea, loss of appetite, and fatigue  The patient is without complaint.  No BPR.                                 Set-up films were reviewed.                                 The chart was checked. Physical Findings. . .  weight is 247 lb 12.8 oz (112.401 kg). His oral temperature is 98.2 F (36.8 C). His blood pressure is 146/81 and his pulse is 108. His respiration is 20. . Weight essentially stable.  No significant changes. Impression . . . . . . . The patient is tolerating radiation. Plan . . . . . . . . . . . . Continue treatment as planned.  ________________________________  Sheral Apley. Tammi Klippel, M.D.

## 2013-11-25 NOTE — Progress Notes (Signed)
Pt states he has dysuria, occasional rectal pain. He denies other urinary issues, bowel issues, nausea, loss of appetite, and fatigue.

## 2013-11-25 NOTE — Telephone Encounter (Signed)
gv adn printed appt sched and avs for pt for March.... °

## 2013-11-25 NOTE — Addendum Note (Signed)
Encounter addended by: Andria Rhein, RN on: 11/25/2013  4:00 PM<BR>     Documentation filed: Notes Section

## 2013-11-25 NOTE — [facility] Oncology Navigator Note (Signed)
Met with patient to assess for needs.  He runs out of Xeloda today and is hopeful the supply will arrive as scheduled Monday when he is due back to start Xeloda.  Delay in shipment of Xeloda is due to the bad weather.  Patient otherwise voices no new complaints, and stated he will be glad to finish XRT and begin healing in rectal area.  Appointment with Dr. Johney Maine was made and given to patient for 12/12/13.  Patient denies any other needs and knows to call if barriers to care arise.

## 2013-11-28 ENCOUNTER — Ambulatory Visit
Admission: RE | Admit: 2013-11-28 | Discharge: 2013-11-28 | Disposition: A | Discharge status: Home / Self Care | Payer: Managed Care, Other (non HMO) | Source: Ambulatory Visit | Attending: Radiation Oncology | Admitting: Radiation Oncology

## 2013-11-29 ENCOUNTER — Ambulatory Visit
Admission: RE | Admit: 2013-11-29 | Discharge: 2013-11-29 | Disposition: A | Discharge status: Home / Self Care | Payer: Managed Care, Other (non HMO) | Source: Ambulatory Visit | Attending: Radiation Oncology | Admitting: Radiation Oncology

## 2013-11-30 ENCOUNTER — Ambulatory Visit
Admission: RE | Admit: 2013-11-30 | Discharge: 2013-11-30 | Disposition: A | Discharge status: Home / Self Care | Payer: Managed Care, Other (non HMO) | Source: Ambulatory Visit | Attending: Radiation Oncology | Admitting: Radiation Oncology

## 2013-12-01 ENCOUNTER — Ambulatory Visit
Admission: RE | Admit: 2013-12-01 | Discharge: 2013-12-01 | Disposition: A | Discharge status: Home / Self Care | Payer: Managed Care, Other (non HMO) | Source: Ambulatory Visit | Attending: Radiation Oncology | Admitting: Radiation Oncology

## 2013-12-02 ENCOUNTER — Encounter: Payer: Self-pay | Admitting: Radiation Oncology

## 2013-12-02 ENCOUNTER — Ambulatory Visit
Admission: RE | Admit: 2013-12-02 | Discharge: 2013-12-02 | Disposition: A | Discharge status: Home / Self Care | Payer: Managed Care, Other (non HMO) | Source: Ambulatory Visit | Attending: Radiation Oncology | Admitting: Radiation Oncology

## 2013-12-02 VITALS — BP 153/92 | HR 108 | Temp 98.4°F | Resp 20 | Wt 249.1 lb

## 2013-12-02 DIAGNOSIS — C20 Malignant neoplasm of rectum: Secondary | ICD-10-CM

## 2013-12-02 NOTE — Progress Notes (Deleted)
   Department of Radiation Oncology   Phone:             [phone] Fax:                 [phone]   Weekly Treatment Note      Name: Stephen Costa       Date: 12/02/2013 MRN: [id number]       DOB: 01/25/1956     Current dose: 43.2 Gy   Current fraction: 24     MEDICATIONS: Current Outpatient Prescriptions   Medication  Sig  Dispense  Refill   .  capecitabine (XELODA) 500 MG tablet  Take 4 tablets (2,000 mg total) by mouth 2 (two) times daily after a meal. On days of radiation only (Mon-Fri)   64 tablet   0   .  HYDROcodone-acetaminophen (NORCO) 5-325 MG per tablet  Take 1 tablet by mouth every 6 (six) hours as needed for moderate pain.   60 tablet   0   .  prochlorperazine (COMPAZINE) 10 MG tablet  Take 1 tablet (10 mg total) by mouth every 6 (six) hours as needed for nausea or vomiting.   30 tablet   0       No current facility-administered medications for this encounter.          ALLERGIES: Percocet     LABORATORY DATA:   Lab Results   Component  Value  Date     WBC  6.6  11/25/2013     HGB  16.8  11/25/2013     HCT  48.3  11/25/2013     MCV  93.0  11/25/2013     PLT  288  11/25/2013       Lab Results   Component  Value  Date     NA  140  10/21/2013     K  4.3  10/21/2013     CO2  24  10/21/2013       Lab Results   Component  Value  Date     ALT  17  10/21/2013     AST  13  10/21/2013     ALKPHOS  104  10/21/2013     BILITOT  0.31  10/21/2013          NARRATIVE: Stephen Costa was seen today for weekly treatment management. The chart was checked and the patient's films were reviewed. The patient is doing well overall with treatment. He has had some increased bowel movements over the last week, no real diarrhea. The patient is complaining of some dysuria toward the end of treatment as well.   PHYSICAL EXAMINATION:   Alert, in no acute distress      ASSESSMENT: The patient is doing satisfactorily with treatment.   PLAN: We will continue with the  patient's radiation treatment as planned. The patient will finish his final treatment next week. He will begin his boost treatment on Tuesday. No major changes made today in his care.    

## 2013-12-02 NOTE — Progress Notes (Signed)
  Department of Radiation Oncology   Phone:             [phone] Fax:                 [phone]   Weekly Treatment Note      Name: Stephen Costa       Date: 12/02/2013 MRN: [id number]       DOB: 1956/01/22     Current dose: 43.2 Gy   Current fraction: 24     MEDICATIONS: Current Outpatient Prescriptions   Medication  Sig  Dispense  Refill   .  capecitabine (XELODA) 500 MG tablet  Take 4 tablets (2,000 mg total) by mouth 2 (two) times daily after a meal. On days of radiation only (Mon-Fri)   64 tablet   0   .  HYDROcodone-acetaminophen (NORCO) 5-325 MG per tablet  Take 1 tablet by mouth every 6 (six) hours as needed for moderate pain.   60 tablet   0   .  prochlorperazine (COMPAZINE) 10 MG tablet  Take 1 tablet (10 mg total) by mouth every 6 (six) hours as needed for nausea or vomiting.   30 tablet   0       No current facility-administered medications for this encounter.          ALLERGIES: Percocet     LABORATORY DATA:   Lab Results   Component  Value  Date     WBC  6.6  11/25/2013     HGB  16.8  11/25/2013     HCT  48.3  11/25/2013     MCV  93.0  11/25/2013     PLT  288  11/25/2013       Lab Results   Component  Value  Date     NA  140  10/21/2013     K  4.3  10/21/2013     CO2  24  10/21/2013       Lab Results   Component  Value  Date     ALT  17  10/21/2013     AST  13  10/21/2013     ALKPHOS  104  10/21/2013     BILITOT  0.31  10/21/2013          NARRATIVE: Divante Kotch was seen today for weekly treatment management. The chart was checked and the patient's films were reviewed. The patient is doing well overall with treatment. He has had some increased bowel movements over the last week, no real diarrhea. The patient is complaining of some dysuria toward the end of treatment as well.   PHYSICAL EXAMINATION:   Alert, in no acute distress      ASSESSMENT: The patient is doing satisfactorily with treatment.   PLAN: We will continue with the  patient's radiation treatment as planned. The patient will finish his final treatment next week. He will begin his boost treatment on Tuesday. No major changes made today in his care.

## 2013-12-02 NOTE — Progress Notes (Signed)
Rectal rad tx 24/28 completed, some dysuria and bowel movements throughout the day, having stomach cramps  A=fter rad txs styated, taking xeloda daily,   Appetite fair, 9:51 AM

## 2013-12-05 ENCOUNTER — Encounter: Payer: Self-pay | Admitting: Radiation Oncology

## 2013-12-05 ENCOUNTER — Ambulatory Visit
Admission: RE | Admit: 2013-12-05 | Discharge: 2013-12-05 | Disposition: A | Discharge status: Home / Self Care | Payer: Managed Care, Other (non HMO) | Source: Ambulatory Visit | Attending: Radiation Oncology | Admitting: Radiation Oncology

## 2013-12-06 ENCOUNTER — Ambulatory Visit
Admission: RE | Admit: 2013-12-06 | Discharge: 2013-12-06 | Disposition: A | Discharge status: Home / Self Care | Payer: Managed Care, Other (non HMO) | Source: Ambulatory Visit | Attending: Radiation Oncology | Admitting: Radiation Oncology

## 2013-12-06 DIAGNOSIS — C20 Malignant neoplasm of rectum: Secondary | ICD-10-CM

## 2013-12-06 NOTE — Progress Notes (Signed)
  Radiation Oncology         [phone] ________________________________  Name: Stephen Costa MRN: [id number]  Date: 12/06/2013  DOB: [birth year]  Simulation Verification Note   NARRATIVE: The patient was brought to the treatment unit and placed in the planned treatment position for the boost treatment. The clinical setup was verified. Then port films were obtained and uploaded to the radiation oncology medical record software.  The treatment beams were carefully compared against the planned radiation fields. The position, location, and shape of the radiation fields was reviewed. The targeted volume of tissue appears to be appropriately covered by the radiation beams. Based on my personal review, I approved the simulation verification. The patient's treatment will proceed as planned.  ________________________________   Jodelle Gross, MD, PhD

## 2013-12-07 ENCOUNTER — Encounter: Payer: Self-pay | Admitting: Radiation Oncology

## 2013-12-07 ENCOUNTER — Ambulatory Visit
Admission: RE | Admit: 2013-12-07 | Discharge: 2013-12-07 | Disposition: A | Discharge status: Home / Self Care | Payer: Managed Care, Other (non HMO) | Source: Ambulatory Visit | Attending: Radiation Oncology | Admitting: Radiation Oncology

## 2013-12-07 VITALS — BP 128/84 | HR 102 | Temp 98.3°F | Resp 20 | Wt 247.6 lb

## 2013-12-07 DIAGNOSIS — C20 Malignant neoplasm of rectum: Secondary | ICD-10-CM

## 2013-12-07 NOTE — Progress Notes (Signed)
   Department of Radiation Oncology  Phone:  [phone] Fax:        [phone]  Weekly Treatment Note    Name: Oumar Marcott Date: 12/07/2013 MRN: [id number] DOB: 13-Feb-1956   Current dose: 48.6 Gy  Current fraction: 27   MEDICATIONS: Current Outpatient Prescriptions  Medication Sig Dispense Refill  . capecitabine (XELODA) 500 MG tablet Take 4 tablets (2,000 mg total) by mouth 2 (two) times daily after a meal. On days of radiation only (Mon-Fri)  64 tablet  0  . HYDROcodone-acetaminophen (NORCO) 5-325 MG per tablet Take 1 tablet by mouth every 6 (six) hours as needed for moderate pain.  60 tablet  0  . prochlorperazine (COMPAZINE) 10 MG tablet Take 1 tablet (10 mg total) by mouth every 6 (six) hours as needed for nausea or vomiting.  30 tablet  0   No current facility-administered medications for this encounter.     ALLERGIES: Percocet   LABORATORY DATA:  Lab Results  Component Value Date   WBC 6.6 11/25/2013   HGB 16.8 11/25/2013   HCT 48.3 11/25/2013   MCV 93.0 11/25/2013   PLT 288 11/25/2013   Lab Results  Component Value Date   NA 140 10/21/2013   K 4.3 10/21/2013   CO2 24 10/21/2013   Lab Results  Component Value Date   ALT 17 10/21/2013   AST 13 10/21/2013   ALKPHOS 104 10/21/2013   BILITOT 0.31 10/21/2013     NARRATIVE: Khamron Gellert was seen today for weekly treatment management. The chart was checked and the patient's films were reviewed. The patient has one more fraction. He has done very well. Some dysuria as we discussed previously. The patient really denies any difficulty with irritation in the anal/rectal region. Appetite has been fair.  PHYSICAL EXAMINATION: weight is 247 lb 9.6 oz (112.311 kg). His oral temperature is 98.3 F (36.8 C). His blood pressure is 128/84 and his pulse is 102. His respiration is 20.        ASSESSMENT: The patient is doing satisfactorily with treatment.  PLAN: We will continue with the patient's radiation treatment as planned.  The patient will followup in one month.

## 2013-12-07 NOTE — Progress Notes (Signed)
Weekly rad txs rectal 27/28 txs , c/o a lot of gas/stomach cramps, fair appetite, eating a lot of popsicles , bowel movements sevearl daily, dysuria, no blood in stool or urine, takes Xeloda 1 month f/u appt card given 10:01 AM

## 2013-12-08 ENCOUNTER — Encounter: Payer: Self-pay | Admitting: Radiation Oncology

## 2013-12-08 ENCOUNTER — Ambulatory Visit
Admission: RE | Admit: 2013-12-08 | Discharge: 2013-12-08 | Disposition: A | Discharge status: Home / Self Care | Payer: Managed Care, Other (non HMO) | Source: Ambulatory Visit | Attending: Radiation Oncology | Admitting: Radiation Oncology

## 2013-12-12 ENCOUNTER — Ambulatory Visit (INDEPENDENT_AMBULATORY_CARE_PROVIDER_SITE_OTHER): Payer: Commercial Indemnity | Admitting: Surgery

## 2013-12-12 ENCOUNTER — Encounter (INDEPENDENT_AMBULATORY_CARE_PROVIDER_SITE_OTHER): Payer: Self-pay | Admitting: Surgery

## 2013-12-12 VITALS — BP 144/88 | HR 80 | Temp 98.6°F | Ht 66.0 in | Wt 247.0 lb

## 2013-12-12 DIAGNOSIS — Z720 Tobacco use: Secondary | ICD-10-CM

## 2013-12-12 DIAGNOSIS — F17200 Nicotine dependence, unspecified, uncomplicated: Secondary | ICD-10-CM

## 2013-12-12 DIAGNOSIS — C19 Malignant neoplasm of rectosigmoid junction: Secondary | ICD-10-CM

## 2013-12-12 DIAGNOSIS — C20 Malignant neoplasm of rectum: Secondary | ICD-10-CM

## 2013-12-12 DIAGNOSIS — K429 Umbilical hernia without obstruction or gangrene: Secondary | ICD-10-CM

## 2013-12-12 MED ORDER — NEOMYCIN SULFATE 500 MG PO TABS: 1000.0000 mg | ORAL_TABLET | ORAL | Status: DC

## 2013-12-12 MED ORDER — HYDROCODONE-ACETAMINOPHEN 5-325 MG PO TABS: 1.0000 | ORAL_TABLET | Freq: Four times a day (QID) | ORAL | Status: DC | PRN

## 2013-12-12 MED ORDER — METRONIDAZOLE 500 MG PO TABS: 500.0000 mg | ORAL_TABLET | ORAL | Status: DC

## 2013-12-12 NOTE — Progress Notes (Signed)
Subjective:     Patient ID: Stephen Costa, male   DOB: 08-06-1956, 58 y.o.   MRN: [id number]  HPI   Note: This dictation was prepared with Dragon/digital dictation along with [facility] technology. Any transcriptional errors that result from this process are unintentional.       Verna Hamon  Jun 07, 1956 [id number]  Patient Care Team: Lynne Logan, MD as PCP - General (Family Medicine) [name], MD as Consulting Physician (Gastroenterology)  This patient is a 58 y.o.male who presents today for surgical evaluation at the request of Dr. Michail Sermon.   Reason for visit: Mid rectal cancer  Pleasant male who returns today with his wife.  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  Often goes with his wife.  He has had  intermittent rectal bleeding over the past few years.  Based on concerns, he was sent for colonoscopy.  A bulky mass was found at the rectosigmoid junction, Highly suspicious for cancer.  Surgical consultation was recommended.  He was put into our multidisciplinary gastrointestinal tumor board.    He struggles with left knee arthritis and is limited in his walking.  He is obese.  He does smoke about one pack a day.  He claims he is cutting back.  Later his wife felt like he had not done not very much.  He has never had any abdominal surgery or anal rectal interventions.   No exertional chest/neck/shoulder/arm pain.  Patient can walk 15 minutes for about 1/4 miles without difficulty.   He has seen medical and radiation oncology.  He just finished neoadjuvant chemoradiation therapy with oral Xeloda and external beam radiation on March 12.  Still struggling with some pressure discomfort where the tumor is.  It is hard for him to feel like he empties.  Still with some rabbit pellet bowel movements.  Occasionally has  flatus vs. Stool exiting.  Trying to walk more.  He has one decent meal a day.  Intentionally lost some weight.   Patient Active Problem List   Diagnosis Date Noted  . Cancer 10/05/2013  . Rectal cancer - Anterior 8cm from anal verge 09/27/2013  . Umbilical hernia - 1cm 64/40/3474  . Obesity (BMI 30-39.9) 09/27/2013  . Tobacco abuse 09/27/2013    Past Medical History  Diagnosis Date  . Rectal cancer 09/26/2013  . Sleep apnea     wears cpap most nights  . Hemorrhoids   . Cancer 10/05/13    rectum  . Allergy   . Arthritis     knees    Past Surgical History  Procedure Laterality Date  . Lumbar disc surgery  1984, 1987  . Wrist surgery Right 2006  . Shoulder surgery Right 1999  . Surgery scrotal / testicular Left 58 y/o    respositioning into scrotum  . Eus N/A 10/05/2013    Procedure: LOWER ENDOSCOPIC ULTRASOUND (EUS);  Surgeon: [name], MD;  Location: [name];  Service: Endoscopy;  Laterality: N/A;  . Flexible sigmoidoscopy N/A 10/05/2013    Procedure: FLEXIBLE SIGMOIDOSCOPY;  Surgeon: [name], MD;  Location: [facility];  Service: Endoscopy;  Laterality: N/A;    History   Social History  . Marital Status: Married    Spouse Name: N/A    Number of Children: N/A  . 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 Smoking status: Current Every  Day Smoker -- 1.00 packs/day for 40 years    Types: Cigarettes  . Smokeless tobacco: Not on file  . Alcohol Use: No  . Drug Use: No  . Sexual Activity: Not on file   Other Topics Concern  . Not on file   Social History Narrative  . No narrative on file    Family History  Problem Relation Age of Onset  . Cancer Maternal Uncle     Throat Cancer  . Cancer Paternal Uncle     Colon Cancer    Current Outpatient Prescriptions  Medication Sig Dispense Refill  . capecitabine (XELODA) 500 MG tablet Take 4 tablets (2,000 mg total) by mouth 2 (two) times daily after a meal. On days of radiation only (Mon-Fri)  64 tablet  0  . HYDROcodone-acetaminophen (NORCO) 5-325 MG per  tablet Take 1 tablet by mouth every 6 (six) hours as needed for moderate pain.  60 tablet  0  . prochlorperazine (COMPAZINE) 10 MG tablet Take 1 tablet (10 mg total) by mouth every 6 (six) hours as needed for nausea or vomiting.  30 tablet  0   No current facility-administered medications for this visit.     Allergies  Allergen Reactions  . Percocet [Oxycodone-Acetaminophen] Other (See Comments)    MAKES PATIENT ANXIOUS & HYPER    BP 144/88  Pulse 80  Temp(Src) 98.6 F (37 C) (Oral)  Ht 5\' 6"  (1.676 m)  Wt 247 lb (112.038 kg)  BMI 39.89 kg/m2  No results found.   Review of Systems  Constitutional: Negative for fever, chills, diaphoresis, activity change, appetite change, fatigue and unexpected weight change.  HENT: Negative for ear discharge, facial swelling, mouth sores, nosebleeds, sore throat and trouble swallowing.   Eyes: Negative for photophobia, discharge and visual disturbance.  Respiratory: Negative for choking, chest tightness, shortness of breath and stridor.   Cardiovascular: Negative for chest pain and palpitations.  Gastrointestinal: Positive for constipation, anal bleeding and rectal pain. Negative for nausea, vomiting, abdominal pain, diarrhea, blood in stool and abdominal distention.  Endocrine: Negative for cold intolerance and heat intolerance.  Genitourinary: Negative for dysuria, urgency, difficulty urinating and testicular pain.  Musculoskeletal: Positive for arthralgias. Negative for back pain, gait problem, myalgias, neck pain and neck stiffness.  Skin: Negative for color change, pallor, rash and wound.  Allergic/Immunologic: Negative for environmental allergies and food allergies.  Neurological: Negative for dizziness, speech difficulty, weakness, numbness and headaches.  Hematological: Negative for adenopathy. Does not bruise/bleed easily.  Psychiatric/Behavioral: Negative for hallucinations, confusion and agitation.       Objective:   Physical Exam   Constitutional: He is oriented to person, place, and time. He appears well-developed and well-nourished. No distress.  HENT:  Head: Normocephalic.  Mouth/Throat: Oropharynx is clear and moist. No oropharyngeal exudate.  Eyes: Conjunctivae and EOM are normal. Pupils are equal, round, and reactive to light. No scleral icterus.  Neck: Normal range of motion. Neck supple. No tracheal deviation present.  Cardiovascular: Normal rate, regular rhythm and intact distal pulses.   Pulmonary/Chest: Effort normal and breath sounds normal. No respiratory distress.  Abdominal: Soft. He exhibits no distension. There is no tenderness. A hernia is present. Hernia confirmed positive in the ventral area. Hernia confirmed negative in the right inguinal area and confirmed negative in the left inguinal area.    Genitourinary:  No rectal exam today   Musculoskeletal: Normal range of motion. He exhibits no tenderness.  Lymphadenopathy:    He has no cervical adenopathy.  Right: No inguinal adenopathy present.       Left: No inguinal adenopathy present.  Neurological: He is alert and oriented to person, place, and time. No cranial nerve deficit. He exhibits normal muscle tone. Coordination normal.  Skin: Skin is warm and dry. No rash noted. He is not diaphoretic. No erythema. No pallor.  Psychiatric: He has a normal mood and affect. His behavior is normal. Judgment and thought content normal.       Assessment:     Mid-rectal rectal cancer Status post completion of neoadjuvant chemoradiation therapy appear    Plan:     Had a long discussion with the patient and his wife using audiovisual aids.  I reviewed his records.  We had a long discussion about the pathophysiology of rectal cancer.  As we discussed therefore, I would recommend minimally invasive resection 10 weeks after Completion of his neoadjuvant chemoradiation therapy = ~ 21 May, 2015  Sphincter sparing very feasible.  Given his risk factors  (male, morbidly obese, smoker) it will very likely require the patient to have a diverting loop ileostomy as the anastomosis is most likely going to be within 5 cm from the anal verge & leak risk will be higher.  I did discuss the procedure with the patient his wife.  Good candidate for minimally invasive approach.  Robotic vs. Laparoscopic:  The anatomy & physiology of the digestive tract was discussed.  The pathophysiology was discussed.  Natural history risks without surgery was discussed.   I worked to give an overview of the disease and the frequent need to have multispecialty involvement.  I feel the risks of no intervention will lead to serious problems that outweigh the operative risks; therefore, I recommended a partial proctocolectomy to remove the pathology.  Robotic, Laparoscopic & open techniques were discussed.  We will work to preserve anal & pelvic floor function without sacrificing cure.  Plan repair of umbilical hernia around the same time.  Risks such as bleeding, infection, abscess, leak, reoperation, possible ostomy, hernia, heart attack, death, and other risks were discussed.  I noted a good likelihood this will help address the problem.   Goals of post-operative recovery were discussed as well.  We will work to minimize complications.  An educational handout on the pathology was given as well.  Questions were answered.    The patient & wife express understanding & wishes to proceed with surgery.  He is STILL very concerned about missing work.  I cautioned him that this is going to be a long drawn out involved process and he will need to take time to take care of this.   STOP SMOKING!  We talked to the patient AGAIN about the dangers of smoking.  We stressed that tobacco use dramatically increases the risk of peri-operative complications such as infection, tissue necrosis leaving to problems with incision/wound and organ healing, hernia, chronic pain, heart attack, stroke, DVT,  pulmonary embolism, and death.  We noted there are programs in our community to help stop smoking.  Information was available.  Addendum: His wife came to me after the visit had questions about cancer and survival and her concerns that he is not exercising enough nor is he motivated to quit smoking.  I think he has some issues of depression and coping but they are trying.  I tried to provide support.  She expressed reassurance, understanding

## 2013-12-12 NOTE — Patient Instructions (Signed)
Please consider the recommendations that we have given you today:  Anticipate surgical resection of her rectal cancer and mid-May (10 weeks after completion equals 21 made).  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  Please see Instruction sheets and information on management of ostomy care.  We will help with training before during and after surgery.  See the Handout(s) we have given you.  Please call our office at [phone] if you wish to schedule surgery or if you have further questions / concerns.   Colorectal Cancer Colorectal cancer is an abnormal growth of tissue (tumor) in the colon or rectum that is cancerous (malignant). Unlike noncancerous (benign) tumors, malignant tumors can spread to other parts of your body. The colon is the large bowel or large intestine. The rectum is the last several inches of the colon.  RISK FACTORS The exact cause of colorectal cancer is unknown. However, the following factors may increase your chances of getting colorectal cancer:   Age older than 63 years.   Abnormal growths (polyps) on the inner wall of the colon or rectum.   Diabetes.   African American race.   Family history of hereditary nonpolyposis colorectal cancer. This condition is caused by changes in the genes that are responsible for repairing mismatched DNA.   Personal history of cancer. A person who has already had colorectal cancer may develop it a second time. Also, women with a history of ovarian, uterine, or breast cancer are at a somewhat higher risk of developing colorectal cancer.  Certain hereditary conditions.  Eating a diet that is high in fat (especially animal fat) and low in fiber, fruits, and vegetables.  Sedentary lifestyle.  Inflammatory bowel disease, including ulcerative colitis and Crohn disease.   Smoking.   Excessive alcohol use.  SYMPTOMS Early colorectal cancer often does not cause symptoms.  As the cancer grows, symptoms may include:   Changes in bowel habits.  Diarrhea.   Constipation.   Feeling like the bowel does not empty completely after a bowel movement.   Blood in the stool.   Stools that are narrower than usual.   Abdominal discomfort, pain, bloating, fullness, or cramps.  Frequent gas pain.   Unexplained weight loss.   Constant tiredness.   Nausea and vomiting.  DIAGNOSIS  Your health care provider will ask about your medical history. He or she may also perform a number of procedures, such as:   A physical exam.  A digital rectal exam.  A fecal occult blood test.  A barium enema.  Blood tests.   X-rays.   Imaging tests, such as CT scans or MRIs.   Taking a tissue sample (biopsy) from your colon or rectum to look for cancer cells.   A sigmoidoscopy to view the inside of the last part of your colon.   A colonoscopy to view the inside of your entire colon.   An endorectal ultrasound to see how deep a rectal tumor has grown and whether the cancer has spread to lymph nodes or other nearby tissues.  Your cancer will be staged to determine its severity and extent. Staging is a careful attempt to find out the size of the tumor, whether the cancer has spread, and if so, to what parts of the body. You may need to have more tests to determine the stage of your cancer. The test results will help determine what treatment plan is best for you.   Stage 0 The cancer  is found only in the innermost lining of the colon or rectum.   Stage I The cancer has grown into the inner wall of the colon or rectum. The cancer has not yet reached the outer wall of the colon.   Stage II The cancer extends more deeply into or through the wall of the colon or rectum. It may have invaded nearby tissue, but cancer cells have not spread to the lymph nodes.   Stage III The cancer has spread to nearby lymph nodes but not to other parts of the body.   Stage  IV The cancer has spread to other parts of the body, such as the liver or lungs.  Your health care provider may tell you the detailed stage of your cancer, which includes both a number and a letter.  TREATMENT  Depending on the type and stage, colorectal cancer may be treated with surgery, radiation therapy, chemotherapy, targeted therapy, or radiofrequency ablation. Some people have a combination of these therapies. Surgery may be done to remove the polyps from your colon. In early stages, your health care provider may be able to do this during a colonoscopy. In later stages, surgery may be done to remove part of your colon.  HOME CARE INSTRUCTIONS   Only take over-the-counter or prescription medicines for pain, discomfort, or fever as directed by your health care provider.   Maintain a healthy diet.   Consider joining a support group. This may help you learn to cope with the stress of having colorectal cancer.   Seek advice to help you manage treatment of side effects.   Keep all follow-up appointments as directed by your health care provider.   Inform your cancer specialist if you are admitted to the hospital.  SEEK MEDICAL CARE IF:  Your diarrhea or constipation does not go away.   Your bowel habits change.  You have increased abdominal pain.   You notice new fatigue or weakness.  You lose weight. Document Released: 09/15/2005 Document Revised: 05/18/2013 Document Reviewed: 03/10/2013 [facility] Patient Information 2014 [place].  Ostomy Support Information  Yes, you've heard that people get along just fine with only one of their eyes, or one of their lungs, or one of their kidneys. But you also know that you have only one intestine and only one bladder, and that leaves you feeling awfully empty, both physically and emotionally: You think no other people go around without part of their intestine with the ends of their intestines sticking out through their abdominal  walls.  Well, you are wrong! There are nearly three quarters of a million people in the [place] who have an ostomy; people who have had surgery to remove all or part of their colons or bladders. There is even a national association, the [place] [employer] [place] with over 350 local affiliated support groups that are organized by volunteers who provide peer support and counseling. Juan [place] has a toll free telephone num-ber, 614-056-9578 and an educational,  interactive website, www.ostomy.org   An ostomy is an opening in the belly (abdominal wall) made by surgery. Ostomates are people who have had this procedure. The opening (stoma) allows the kidney or bowel to discharge waste. An external pouch covers the stoma to collect waste. Pouches are are a simple bag and are odor free. Different companies have disposable or reusable pouches to fit one's lifestyle. An ostomy can either be temporary or permanent.  THERE ARE THREE MAIN TYPES OF OSTOMIES  Colostomy. A colostomy is a  surgically created opening in the large intestine (colon).  Ileostomy. An ileostomy is a surgically created opening in the small intestine.  Urostomy. A urostomy is a surgically created opening to divert urine away from the bladder. FREQUENTLY ASKED QUESTIONS   Why haven't you met any of these folks who have an ostomy?  Well, maybe you have! You just did not recognize them because an ostomy doesn't show. It can be kept secret if you wish. Why, maybe some of your best friends, office associates or neighbors have an ostomy ... you never can tell.   People facing ostomy surgery have many quality-of-life questions like:  Will you bulge? Smell? Make noises? Will you feel waste leaving your body? Will you be a captive of the toilet? Will you starve? Be a social outcast? Get/stay married? Have babies? Easily bathe, go swimming, bend over?  OK, let's look at what you can expect:  Will you bulge?  Remember, without part of the  intestine or bladder, and its contents, you should have a flatter tummy than before. You can expect to wear, with little exception, what you wore before surgery ... and this in-cludes tight clothing and bathing suits.  Will you smell?  Today, thanks to modern odor proof pouching systems, you can walk into an ostomy support group meeting and not smell anything that is foul or offensive. And, for those with an ileostomy or colostomy who are concerned about odor when emptying their pouch, there are in-pouch deodorants that can be used to eliminate any waste odors that may exist.  Will you make noises?  Everyone produces gas, especially if they are an air-swallower. But intestinal sounds that occur from time to time are no differ-ent than a gurgling tummy, and quite often your clothing will muffle any sounds.   Will you feel the waste discharges?  For those with a colostomy or ileostomy there might be a slight pressure when waste leaves your body, but understand that the intestines have no nerve endings, so there will be no unpleasant sensations. Those with a urostomy will probably be unaware of any kidney drainage.  Will you be a captive of the toilet?  Immediately post-op you will spend more time in the bathroom than you will after your body recovers from surgery. Every person is different, but on average those with an ileostomy or urostomy may empty their pouches 4 to 6 times a day; a little  less if you have a colostomy. The average wear time between pouch system changes is 3 to 5 days and the changing process should take less than 30 minutes.  Will I need to be on a special diet? Most people return to their normal diet when they have recovered from surgery. Be sure to chew your food well, eat a well-balanced diet and drink plenty of fluids. If you experience problems with a certain food, wait a couple of weeks and try it again. Will there be odor and noises? Pouching systems are designed to be  odor-proof or odor-resistant. There are deodorants that can be used in the pouch. Medications are also available to help reduce odor. Limit gas-producing foods and carbonated beverages. You will experience less gas and fewer noises as you heal from surgery. How much time will it take to care for my ostomy? At first, you may spend a lot of time learning about your ostomy and how to take care of it. As you become more comfortable and skilled at changing the pouching system, it will  take very little time to care for it.  Will I be able to return to work? People with ostomies can perform most jobs. As soon as you have healed from surgery, you should be able to return to work. Heavy lifting (more than 10 pounds) may be discouraged.  What about intimacy? Sexual relationships and intimacy are important and fulfilling aspects of your life. They should continue after ostomy surgery. Intimacy-related concerns should be discussed openly between you and your partner.  Can I wear regular clothing? You do not need to wear special clothing. Ostomy pouches are fairly flat and barely noticeable. Elastic undergarments will not hurt the stoma or prevent the ostomy from functioning.  Can I participate in sports? An ostomy should not limit your involvement in sports. Many people with ostomies are runners, skiers, swimmers or participate in other active lifestyles. Talk with your caregiver first before doing heavy physical activity.  Will you starve?  Not if you follow doctor's orders at each stage of your post-op adjustment. There is no such thing as an "ostomy diet". Some people with an ostomy will be able to eat and tolerate anything; others may find diffi-culty with some foods. Each person is an individual and must determine, by trial, what is best for them. A good practice for all is to drink plenty of water.  Will you be a social outcast?  Have you met anyone who has an ostomy and is a social outcast? Why should you  be the first? Only your attitude and self image will effect how you are treated. No confi-dent person is an Occupational psychologist.   PROFESSIONAL HELP  Resources are available if you need help or have questions about your ostomy.    Specially trained nurses called Wound, Ostomy Continence Nurses (WOCN) are available for consultation in most major medical centers.   Consider getting an ostomy consult with Cena Benton at [facility] to help troubleshoot collagen fittings and other issues with your ostomy: [phone]   The [place] (UOA) is a group made up of many local chapters throughout the [place]. These local groups hold meetings and provide support to prospective and existing ostomates. They sponsor educational events and have qualified visitors to make personal or telephone visits. Contact the UOA for the chapter nearest you and for other educational publications.  More detailed information can be found in Colostomy Guide, a publication of the [employer] (UOA). Contact UOA at 1-657-643-0476 or visit their web site at [email or web address] The website contains links to other sites, suppliers and resources. Document Released: 09/18/2003 Document Revised: 12/08/2011 Document Reviewed: 01/17/2009 [facility] Patient Information 2013 [place].  ABDOMINAL SURGERY: POST OP INSTRUCTIONS  1. DIET: Follow a light bland diet the first 24 hours after arrival home, such as soup, liquids, crackers, etc.  Be sure to include lots of fluids daily.  Avoid fast food or heavy meals as your are more likely to get nauseated.  Eat a low fat the next few days after surgery.   2. Take your usually prescribed home medications unless otherwise directed. 3. PAIN CONTROL: a. Pain is best controlled by a usual combination of three different methods TOGETHER: i. Ice/Heat ii. Over the counter pain medication iii. Prescription pain medication b. Most patients will experience some  swelling and bruising around the incisions.  Ice packs or heating pads (30-60 minutes up to 6 times a day) will help. Use ice for the first few days to help decrease swelling and bruising, then  switch to heat to help relax tight/sore spots and speed recovery.  Some people prefer to use ice alone, heat alone, alternating between ice & heat.  Experiment to what works for you.  Swelling and bruising can take several weeks to resolve.   c. It is helpful to take an over-the-counter pain medication regularly for the first few weeks.  Choose one of the following that works best for you: i. Naproxen (Aleve, etc)  Two 234m tabs twice a day ii. Ibuprofen (Advil, etc) Three 2076mtabs four times a day (every meal & bedtime) iii. Acetaminophen (Tylenol, etc) 500-65062mour times a day (every meal & bedtime) d. A  prescription for pain medication (such as oxycodone, hydrocodone, etc) should be given to you upon discharge.  Take your pain medication as prescribed.  i. If you are having problems/concerns with the prescription medicine (does not control pain, nausea, vomiting, rash, itching, etc), please call us [phone] see if we need to switch you to a different pain medicine that will work better for you and/or control your side effect better. ii. If you need a refill on your pain medication, please contact your pharmacy.  They will contact our office to request authorization. Prescriptions will not be filled after 5 pm or on week-ends. 4. Avoid getting constipated.  Between the surgery and the pain medications, it is common to experience some constipation.  Increasing fluid intake and taking a fiber supplement (such as Metamucil, Citrucel, FiberCon, MiraLax, etc) 1-2 times a day regularly will usually help prevent this problem from occurring.  A mild laxative (prune juice, Milk of Magnesia, MiraLax, etc) should be taken according to package directions if there are no bowel movements after 48 hours.   5. Watch  out for diarrhea.  If you have many loose bowel movements, simplify your diet to bland foods & liquids for a few days.  Stop any stool softeners and decrease your fiber supplement.  Switching to mild anti-diarrheal medications (Kayopectate, Pepto Bismol) can help.  If this worsens or does not improve, please call us.[place]. Wash / shower every day.  You may shower over the incision / wound.  Avoid baths until the skin is fully healed.  Continue to shower over incision(s) after the dressing is off. 7. Remove your waterproof bandages 5 days after surgery.  You may leave the incision open to air.  You may replace a dressing/Band-Aid to cover the incision for comfort if you wish. 8. ACTIVITIES as tolerated:   a. You may resume regular (light) daily activities beginning the next day-such as daily self-care, walking, climbing stairs-gradually increasing activities as tolerated.  If you can walk 30 minutes without difficulty, it is safe to try more intense activity such as jogging, treadmill, bicycling, low-impact aerobics, swimming, etc. b. Save the most intensive and strenuous activity for last such as sit-ups, heavy lifting, contact sports, etc  Refrain from any heavy lifting or straining until you are off narcotics for pain control.   c. DO NOT PUSH THROUGH PAIN.  Let pain be your guide: If it hurts to do something, don't do it.  Pain is your body warning you to avoid that activity for another week until the pain goes down. d. You may drive when you are no longer taking prescription pain medication, you can comfortably wear a seatbelt, and you can safely maneuver your car and apply brakes. e. YouDennis Basty have sexual intercourse when it is comfortable.  9. FOLLOW UP in our office  a. Please call [facility] at [phone] to set up an appointment to see your surgeon in the office for a follow-up appointment approximately 1-2 weeks after your surgery. b. Make sure that you call for this appointment the day you arrive home  to insure a convenient appointment time. 10. IF YOU HAVE DISABILITY OR FAMILY LEAVE FORMS, BRING THEM TO THE OFFICE FOR PROCESSING.  DO NOT GIVE THEM TO YOUR DOCTOR.   WHEN TO CALL us [phone]: 1. Poor pain control 2. Reactions / problems with new medications (rash/itching, nausea, etc)  3. Fever over 101.5 F (38.5 C) 4. Inability to urinate 5. Nausea and/or vomiting 6. Worsening swelling or bruising 7. Continued bleeding from incision. 8. Increased pain, redness, or drainage from the incision  The clinic staff is available to answer your questions during regular business hours (8:30am-5pm).  Please don't hesitate to call and ask to speak to one of our nurses for clinical concerns.   A surgeon from [facility] is always on call at the hospitals   If you have a medical emergency, go to the nearest emergency room or call 911.    [facility], [place], [place], [place], [place]  [address] ? MAIN: [phone] ? TOLL FREE: [phone] ? FAX [phone] [email or web address]  STOP SMOKING!  We strongly recommend that you stop smoking.  Smoking increases the risk of surgery including infection in the form of an open wound, pus formation, abscess, hernia at an incision on the abdomen, etc.  You have an increased risk of other MAJOR complications such as stroke, heart attack, forming clots in the leg and/or lungs, and death.    Smoking Cessation Quitting smoking is important to your health and has many advantages. However, it is not always easy to quit since nicotine is a very addictive drug. Often times, people try 3 times or more before being able to quit. This document explains the best ways for you to prepare to quit smoking. Quitting takes hard work and a lot of effort, but you can do it. ADVANTAGES OF QUITTING SMOKING  You will live longer, feel better, and live better.  Your body will feel the impact of quitting smoking  almost immediately.  Within 20 minutes, blood pressure decreases. Your pulse returns to its normal level.  After 8 hours, carbon monoxide levels in the blood return to normal. Your oxygen level increases.  After 24 hours, the chance of having a heart attack starts to decrease. Your breath, hair, and body stop smelling like smoke.  After 48 hours, damaged nerve endings begin to recover. Your sense of taste and smell improve.  After 72 hours, the body is virtually free of nicotine. Your bronchial tubes relax and breathing becomes easier.  After 2 to 12 weeks, lungs can hold more air. Exercise becomes easier and circulation improves.  The risk of having a heart attack, stroke, cancer, or lung disease is greatly reduced.  After 1 year, the risk of coronary heart disease is cut in half.  After 5 years, the risk of stroke falls to the same as a nonsmoker.  After 10 years, the risk of lung cancer is cut in half and the risk of other cancers decreases significantly.  After 15 years, the risk of coronary heart disease drops, usually to the level of a nonsmoker.  If you are pregnant, quitting smoking will improve your chances of having a healthy baby.  The people you live with, especially  any children, will be healthier.  You will have extra money to spend on things other than cigarettes. QUESTIONS TO THINK ABOUT BEFORE ATTEMPTING TO QUIT You may want to talk about your answers with your caregiver.  Why do you want to quit?  If you tried to quit in the past, what helped and what did not?  What will be the most difficult situations for you after you quit? How will you plan to handle them?  Who can help you through the tough times? Your family? Friends? A caregiver?  What pleasures do you get from smoking? What ways can you still get pleasure if you quit? Here are some questions to ask your caregiver:  How can you help me to be successful at quitting?  What medicine do you think  would be best for me and how should I take it?  What should I do if I need more help?  What is smoking withdrawal like? How can I get information on withdrawal? GET READY  Set a quit date.  Change your environment by getting rid of all cigarettes, ashtrays, matches, and lighters in your home, car, or work. Do not let people smoke in your home.  Review your past attempts to quit. Think about what worked and what did not. GET SUPPORT AND ENCOURAGEMENT You have a better chance of being successful if you have help. You can get support in many ways.  Tell your family, friends, and co-workers that you are going to quit and need their support. Ask them not to smoke around you.  Get individual, group, or telephone counseling and support. Programs are available at [employer] and health centers. Call your local health department for information about programs in your area.  Spiritual beliefs and practices may help some smokers quit.  Download a "quit meter" on your computer to keep track of quit statistics, such as how long you have gone without smoking, cigarettes not smoked, and money saved.  Get a self-help book about quitting smoking and staying off of tobacco. Jefferson yourself from urges to smoke. Talk to someone, go for a walk, or occupy your time with a task.  Change your normal routine. Take a different route to work. Drink tea instead of coffee. Eat breakfast in a different place.  Reduce your stress. Take a hot bath, exercise, or read a book.  Plan something enjoyable to do every day. Reward yourself for not smoking.  Explore interactive web-based programs that specialize in helping you quit. GET MEDICINE AND USE IT CORRECTLY Medicines can help you stop smoking and decrease the urge to smoke. Combining medicine with the above behavioral methods and support can greatly increase your chances of successfully quitting smoking.  Nicotine  replacement therapy helps deliver nicotine to your body without the negative effects and risks of smoking. Nicotine replacement therapy includes nicotine gum, lozenges, inhalers, nasal sprays, and skin patches. Some may be available over-the-counter and others require a prescription.  Antidepressant medicine helps people abstain from smoking, but how this works is unknown. This medicine is available by prescription.  Nicotinic receptor partial agonist medicine simulates the effect of nicotine in your brain. This medicine is available by prescription. Ask your caregiver for advice about which medicines to use and how to use them based on your health history. Your caregiver will tell you what side effects to look out for if you choose to be on a medicine or therapy. Carefully read the information on  the package. Do not use any other product containing nicotine while using a nicotine replacement product.  RELAPSE OR DIFFICULT SITUATIONS Most relapses occur within the first 3 months after quitting. Do not be discouraged if you start smoking again. Remember, most people try several times before finally quitting. You may have symptoms of withdrawal because your body is used to nicotine. You may crave cigarettes, be irritable, feel very hungry, cough often, get headaches, or have difficulty concentrating. The withdrawal symptoms are only temporary. They are strongest when you first quit, but they will go away within 10 14 days. To reduce the chances of relapse, try to:  Avoid drinking alcohol. Drinking lowers your chances of successfully quitting.  Reduce the amount of caffeine you consume. Once you quit smoking, the amount of caffeine in your body increases and can give you symptoms, such as a rapid heartbeat, sweating, and anxiety.  Avoid smokers because they can make you want to smoke.  Do not let weight gain distract you. Many smokers will gain weight when they quit, usually less than 10 pounds. Eat a  healthy diet and stay active. You can always lose the weight gained after you quit.  Find ways to improve your mood other than smoking. FOR MORE INFORMATION  www.smokefree.gov    While it can be one of the most difficult things to do, the [place] has programs to help you stop.  Consider talking with your primary care physician about options.  Also, Smoking Cessation classes are available through the [facility]:  The smoking cessation program is a proven-effective program from the American Lung Association. The program is available for anyone 64 and older who currently smokes. The program lasts for 7 weeks and is 8 sessions. Each class will be approximately 1 1/2 hours. The program is every Tuesday.  All classes are 12-1:30pm and same location.  Event Location Information:  Location: [facility] 2nd Floor Conference Room 2-037; located next to [facility] cross streets: [place] Entrance into the [facility] is adjacent to the [employer] main entrance. The conference room is located on the 2nd floor.  Parking Instructions: Visitor parking is adjacent to [employer] main entrance and the [place]    A smoking cessation program is also offered through the [facility]. Register online at [email or web address] or call [phone] for more information.   Tobacco cessation counseling is available at [facility]. Call [phone] for a free appointment.   Tobacco cessation classes also are available through the [facility] in [place]. For information, call [phone].   The Patient Education Network features videos on tobacco cessation. Please consult your listings in the center of this book to find instructions on how to access this resource.   If you want more information, ask your nurse.      GETTING TO GOOD BOWEL  HEALTH. Irregular bowel habits such as constipation and diarrhea can lead to many problems over time.  Having one soft bowel movement a day is the most important way to prevent further problems.  The anorectal canal is designed to handle stretching and feces to safely manage our ability to get rid of solid waste (feces, poop, stool) out of our body.  BUT, hard constipated stools can act like ripping concrete bricks and diarrhea can be a burning fire to this very sensitive area of our body, causing inflamed hemorrhoids, anal  fissures, increasing risk is perirectal abscesses, abdominal pain/bloating, an making irritable bowel worse.     The goal: ONE SOFT BOWEL MOVEMENT A DAY!  To have soft, regular bowel movements:    Drink at least 8 tall glasses of water a day.     Take plenty of fiber.  Fiber is the undigested part of plant food that passes into the colon, acting s "natures broom" to encourage bowel motility and movement.  Fiber can absorb and hold large amounts of water. This results in a larger, bulkier stool, which is soft and easier to pass. Work gradually over several weeks up to 6 servings a day of fiber (25g a day even more if needed) in the form of: o Vegetables -- Root (potatoes, carrots, turnips), leafy green (lettuce, salad greens, celery, spinach), or cooked high residue (cabbage, broccoli, etc) o Fruit -- Fresh (unpeeled skin & pulp), Dried (prunes, apricots, cherries, etc ),  or stewed ( applesauce)  o Whole grain breads, pasta, etc (whole wheat)  o Bran cereals    Bulking Agents -- This type of water-retaining fiber generally is easily obtained each day by one of the following:  o Psyllium bran -- The psyllium plant is remarkable because its ground seeds can retain so much water. This product is available as Metamucil, Konsyl, Effersyllium, Per Diem Fiber, or the less expensive generic preparation in drug and health food stores. Although labeled a laxative, it really is not a laxative.   o Methylcellulose -- This is another fiber derived from wood which also retains water. It is available as Citrucel. o Polyethylene Glycol - and "artificial" fiber commonly called Miralax or Glycolax.  It is helpful for people with gassy or bloated feelings with regular fiber o Flax Seed - a less gassy fiber than psyllium   No reading or other relaxing activity while on the toilet. If bowel movements take longer than 5 minutes, you are too constipated   AVOID CONSTIPATION.  High fiber and water intake usually takes care of this.  Sometimes a laxative is needed to stimulate more frequent bowel movements, but    Laxatives are not a good long-term solution as it can wear the colon out. o Osmotics (Milk of Magnesia, Fleets phosphosoda, Magnesium citrate, MiraLax, GoLytely) are safer than  o Stimulants (Senokot, Castor Oil, Dulcolax, Ex Lax)    o Do not take laxatives for more than 7days in a row.    IF SEVERELY CONSTIPATED, try a Bowel Retraining Program: o Do not use laxatives.  o Eat a diet high in roughage, such as bran cereals and leafy vegetables.  o Drink six (6) ounces of prune or apricot juice each morning.  o Eat two (2) large servings of stewed fruit each day.  o Take one (1) heaping tablespoon of a psyllium-based bulking agent twice a day. Use sugar-free sweetener when possible to avoid excessive calories.  o Eat a normal breakfast.  o Set aside 15 minutes after breakfast to sit on the toilet, but do not strain to have a bowel movement.  o If you do not have a bowel movement by the third day, use an enema and repeat the above steps.    Controlling diarrhea o Switch to liquids and simpler foods for a few days to avoid stressing your intestines further. o Avoid dairy products (especially milk & ice cream) for a short time.  The intestines often can lose the ability to digest lactose when stressed. o Avoid foods that cause gassiness  or bloating.  Typical foods include beans and other  legumes, cabbage, broccoli, and dairy foods.  Every person has some sensitivity to other foods, so listen to our body and avoid those foods that trigger problems for you. o Adding fiber (Citrucel, Metamucil, psyllium, Miralax) gradually can help thicken stools by absorbing excess fluid and retrain the intestines to act more normally.  Slowly increase the dose over a few weeks.  Too much fiber too soon can backfire and cause cramping & bloating. o Probiotics (such as active yogurt, Align, etc) may help repopulate the intestines and colon with normal bacteria and calm down a sensitive digestive tract.  Most studies show it to be of mild help, though, and such products can be costly. o Medicines:   Bismuth subsalicylate (ex. Kayopectate, Pepto Bismol) every 30 minutes for up to 6 doses can help control diarrhea.  Avoid if pregnant.   Loperamide (Immodium) can slow down diarrhea.  Start with two tablets (61m total) first and then try one tablet every 6 hours.  Avoid if you are having fevers or severe pain.  If you are not better or start feeling worse, stop all medicines and call your doctor for advice o Call your doctor if you are getting worse or not better.  Sometimes further testing (cultures, endoscopy, X-ray studies, bloodwork, etc) may be needed to help diagnose and treat the cause of the diarrhea. o

## 2013-12-13 ENCOUNTER — Telehealth (INDEPENDENT_AMBULATORY_CARE_PROVIDER_SITE_OTHER): Payer: Self-pay

## 2013-12-13 NOTE — Telephone Encounter (Signed)
Called pt to notify him that we wanted to send him some information in the mail about viewing the [place] program we have set up that he could go on line to watch some educational videos about his upcoming surgery as long as he agrees for me to use his email address. The pt said this was fine for [place] to set him up with so I went a head with setting the program up which should send him an email with the link along with pass code to view the video's. I told the pt to call me back if not able to view. The pt understands.

## 2013-12-16 ENCOUNTER — Ambulatory Visit (HOSPITAL_BASED_OUTPATIENT_CLINIC_OR_DEPARTMENT_OTHER): Payer: Managed Care, Other (non HMO) | Admitting: Nurse Practitioner

## 2013-12-16 ENCOUNTER — Telehealth: Payer: Self-pay | Admitting: Oncology

## 2013-12-16 ENCOUNTER — Other Ambulatory Visit (HOSPITAL_BASED_OUTPATIENT_CLINIC_OR_DEPARTMENT_OTHER): Payer: Managed Care, Other (non HMO)

## 2013-12-16 VITALS — BP 134/79 | HR 105 | Temp 98.6°F | Resp 20 | Ht 66.0 in | Wt 246.6 lb

## 2013-12-16 DIAGNOSIS — C20 Malignant neoplasm of rectum: Secondary | ICD-10-CM

## 2013-12-16 DIAGNOSIS — K6289 Other specified diseases of anus and rectum: Secondary | ICD-10-CM

## 2013-12-16 DIAGNOSIS — F17200 Nicotine dependence, unspecified, uncomplicated: Secondary | ICD-10-CM

## 2013-12-16 DIAGNOSIS — C19 Malignant neoplasm of rectosigmoid junction: Secondary | ICD-10-CM

## 2013-12-16 DIAGNOSIS — R918 Other nonspecific abnormal finding of lung field: Secondary | ICD-10-CM

## 2013-12-16 LAB — CBC WITH DIFFERENTIAL/PLATELET
BASO%: 0.4 % (ref 0.0–2.0)
BASOS ABS: 0 10*3/uL (ref 0.0–0.1)
EOS%: 2.7 % (ref 0.0–7.0)
Eosinophils Absolute: 0.2 10*3/uL (ref 0.0–0.5)
HEMATOCRIT: 49.9 % (ref 38.4–49.9)
HGB: 17 g/dL (ref 13.0–17.1)
LYMPH%: 9.9 % — ABNORMAL LOW (ref 14.0–49.0)
MCH: 32.8 pg (ref 27.2–33.4)
MCHC: 34.2 g/dL (ref 32.0–36.0)
MCV: 96 fL (ref 79.3–98.0)
MONO#: 0.8 10*3/uL (ref 0.1–0.9)
MONO%: 10.3 % (ref 0.0–14.0)
NEUT#: 5.8 10*3/uL (ref 1.5–6.5)
NEUT%: 76.7 % — AB (ref 39.0–75.0)
PLATELETS: 276 10*3/uL (ref 140–400)
RBC: 5.19 10*6/uL (ref 4.20–5.82)
RDW: 18.3 % — ABNORMAL HIGH (ref 11.0–14.6)
WBC: 7.6 10*3/uL (ref 4.0–10.3)
lymph#: 0.7 10*3/uL — ABNORMAL LOW (ref 0.9–3.3)

## 2013-12-16 NOTE — Progress Notes (Signed)
OFFICE PROGRESS NOTE  Interval history:  [name] returns for followup of rectal cancer. He completed radiation and Xeloda on 12/07/2013. He denies nausea/vomiting. No mouth sores. No hand or foot pain or redness. He continues to have some pain with bowel movements. No rectal bleeding. Stomach cramps have significantly improved. He continues to note a metallic taste.   Objective: Filed Vitals:   12/16/13 1008  BP: 134/79  Pulse: 105  Temp: 98.6 F (37 C)  Resp: 20   Oropharynx is without thrush or ulceration. Lungs are clear. Regular cardiac rhythm. Abdomen soft and nontender. No hepatomegaly. No leg edema. Hyperpigmentation over the hands.   Lab Results: Lab Results  Component Value Date   WBC 7.6 12/16/2013   HGB 17.0 12/16/2013   HCT 49.9 12/16/2013   MCV 96.0 12/16/2013   PLT 276 12/16/2013   NEUTROABS 5.8 12/16/2013    Chemistry:    Chemistry      Component Value Date/Time   NA 140 10/21/2013 1048   K 4.3 10/21/2013 1048   CO2 24 10/21/2013 1048   BUN 12.2 10/21/2013 1048   CREATININE 0.8 10/21/2013 1048      Component Value Date/Time   CALCIUM 10.0 10/21/2013 1048   ALKPHOS 104 10/21/2013 1048   AST 13 10/21/2013 1048   ALT 17 10/21/2013 1048   BILITOT 0.31 10/21/2013 1048       Studies/Results: No results found.  Medications: I have reviewed the patient's current medications.  Assessment/Plan: 1. 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  Initiation of radiation and Xeloda 10/31/2013; completion on 12/07/2013. 2. Indeterminate 3 mm right upper lobe nodule on staging chest CT 09/30/2013. 3. Rectal pain, constipation and bleeding secondary to #1. Improved. 4. Ongoing tobacco use.  Dispositon-he appears stable. He has completed neoadjuvant radiation and Xeloda. He has seen Stephen Costa and is scheduled for surgery on 02/21/2014. We scheduled a return visit here on 03/07/2014. He will contact the office prior to his next visit with any problems.  Plan  reviewed with Dr. Benay Spice.   Ned Card ANP/GNP-BC

## 2013-12-16 NOTE — Telephone Encounter (Signed)
gv adn printed aptp sched and avs for pt for June °

## 2013-12-20 NOTE — Progress Notes (Signed)
  Radiation Oncology         (336) [phone] ________________________________  Name: Stephen Costa MRN: [id number]  Date: 12/05/2013  DOB: July 12, 1956  COMPLEX SIMULATION  NOTE  Diagnosis: rectal cancer  Narrative The patient has initially been planned to receive a course of radiation treatment to a dose of 45 gray in 25 fractions at 1.8 gray per fraction. The patient will now receive a boost to the high risk target volume for an additional 5.4 gray. This will be delivered in 3 fractions at 1.8 gray per fraction and a cone down boost technique will be utilized. To accomplish this, an additional 4 customized blocks have been designed for this purpose. A complex isodose plan is requested to ensure that the high-risk target region receives the appropriate radiation dose and that the nearby normal structures continue to be appropriately spared. The patient's final total dose therefore will be 50.4 gray.   ________________________________ ------------------------------------------------  Jodelle Gross, MD, PhD

## 2013-12-20 NOTE — Progress Notes (Signed)
  Radiation Oncology         (336) [phone] ________________________________  Name: Stephen Costa MRN: [id number]  Date: 12/08/2013  DOB: Jul 15, 1956  End of Treatment Note  Diagnosis:   Rectal cancer     Indication for treatment:  curative       Radiation treatment dates:   10/31/2013 through 12/08/2013  Site/dose:    The patient was treated to the pelvis including the gross tumor volume and at risk nodal volumes to a dose of 45 Gy at 1.8 Gy per fraction. This was accomplished using a 4 field 3-D conformal technique. The patient then received a boost to the tumor and adjacent high-risk regions for an additional 5.4 Gy at 1.8 gray per fraction. This was carried out using a coned-down 4 field approach. The patient's total dose was 50.4 Gy.   Narrative: The patient tolerated radiation treatment relatively well.   The patient had mild skin irritation throughout his course of treatment.  Plan: The patient has completed radiation treatment. The patient will return to radiation oncology clinic for routine followup in one month. I advised the patient to call or return sooner if they have any questions or concerns related to their recovery or treatment. ________________________________  Jodelle Gross, M.D., Ph.D.

## 2014-01-06 ENCOUNTER — Encounter (INDEPENDENT_AMBULATORY_CARE_PROVIDER_SITE_OTHER): Payer: Self-pay

## 2014-01-06 ENCOUNTER — Encounter: Payer: Self-pay | Admitting: Radiation Oncology

## 2014-01-09 ENCOUNTER — Encounter: Payer: Self-pay | Admitting: Oncology

## 2014-01-09 NOTE — Progress Notes (Signed)
Put [employer] disability form on nurse's desk. °

## 2014-01-10 ENCOUNTER — Encounter: Payer: Self-pay | Admitting: Oncology

## 2014-01-10 NOTE — Progress Notes (Signed)
Faxed disability form to [employer] @ [phone] °

## 2014-01-10 NOTE — Progress Notes (Signed)
[employer] disability forms returned to managed care

## 2014-01-19 ENCOUNTER — Ambulatory Visit
Admission: RE | Admit: 2014-01-19 | Discharge: 2014-01-19 | Disposition: A | Discharge status: Home / Self Care | Payer: Managed Care, Other (non HMO) | Source: Ambulatory Visit | Attending: Radiation Oncology | Admitting: Radiation Oncology

## 2014-01-19 VITALS — BP 150/95 | HR 101 | Temp 98.3°F | Wt 254.3 lb

## 2014-01-19 DIAGNOSIS — C20 Malignant neoplasm of rectum: Secondary | ICD-10-CM

## 2014-01-19 HISTORY — DX: Personal history of irradiation: Z92.3

## 2014-01-19 NOTE — Progress Notes (Signed)
  Radiation Oncology         [phone] ________________________________  Name: Stephen Costa MRN: [id number]  Date: 01/19/2014  DOB: 1956/08/17  Follow-Up Visit Note  CC: Lynne Logan, MD  [name], MD  Diagnosis:   Rectal cancer  Interval Since Last Radiation:  Approximately one month   Narrative:  The patient returns today for routine follow-up.  The patient states that things have gone well since the end of treatment in terms of recovery. No significant ongoing  issues in terms of rectal irritation. No blood per rectum. Bowel movements have improved and the patient does not have to undergo screening. He does continue to complain of some occasional burning with urination, not every day.                        ALLERGIES:  is allergic to percocet.  Meds: Current Outpatient Prescriptions  Medication Sig Dispense Refill  . naproxen sodium (ANAPROX) 220 MG tablet Take 220 mg by mouth 2 (two) times daily with a meal.      . HYDROcodone-acetaminophen (NORCO) 5-325 MG per tablet Take 1 tablet by mouth every 6 (six) hours as needed for moderate pain or severe pain.  60 tablet  0  . metroNIDAZOLE (FLAGYL) 500 MG tablet Take 1 tablet (500 mg total) by mouth as directed. Take 2 pills (=1000mg ) by mouth at 1pm, 3pm, and 10pm the day before your colorectal operation as discussed in [place] office.  Call [phone] with questions  6 tablet  0  . neomycin (MYCIFRADIN) 500 MG tablet Take 2 tablets (1,000 mg total) by mouth as directed. Take 2 pills (=1000mg ) by mouth at 1pm, 3pm, and 10pm the day before your colorectal operation as discussed in [place] office.  Call [phone] with questions  6 tablet  0  . prochlorperazine (COMPAZINE) 10 MG tablet Take 1 tablet (10 mg total) by mouth every 6 (six) hours as needed for nausea or vomiting.  30 tablet  0   No current facility-administered medications for this encounter.    Physical Findings: The patient is in no acute distress. Patient is alert  and oriented.  weight is 254 lb 4.8 oz (115.35 kg). His temperature is 98.3 F (36.8 C). His blood pressure is 150/95 and his pulse is 101. His oxygen saturation is 94%. .      Lab Findings: Lab Results  Component Value Date   WBC 7.6 12/16/2013   HGB 17.0 12/16/2013   HCT 49.9 12/16/2013   MCV 96.0 12/16/2013   PLT 276 12/16/2013     Radiographic Findings: No results found.  Impression:    The patient is doing well approximately one month after completing preoperative chemoradiotherapy for rectal cancer. Surgery is scheduled on 02/21/2014.   Plan:  The patient will followup in 6 months.   Jodelle Gross, M.D., Ph.D.

## 2014-01-19 NOTE — Progress Notes (Signed)
Patient for one month follow up completion of radiation to rectum.Doing well from radiation standpoint.No longer taking hydrocodone, now takes aleve.Surgical date is scheduled for 02/21/14.Main concern is cold symptoms of productive cough, shortness of breath.

## 2014-01-23 ENCOUNTER — Encounter: Payer: Self-pay | Admitting: Oncology

## 2014-01-23 NOTE — Progress Notes (Signed)
Faxed disability questionaire to [employer] @ [phone]

## 2014-02-10 ENCOUNTER — Encounter (HOSPITAL_COMMUNITY): Payer: Self-pay | Admitting: Pharmacy Technician

## 2014-02-14 NOTE — Patient Instructions (Addendum)
Stephen Costa  02/14/2014                           YOUR PROCEDURE IS SCHEDULED ON: 02/21/14 at 7:15 AM               ENTER [place] ENTRANCE AND                            FOLLOW  SIGNS TO SHORT STAY CENTER                 ARRIVE AT SHORT STAY AT: 5:15 AM               CALL THIS NUMBER IF ANY PROBLEMS THE DAY OF SURGERY :               [phone]                                REMEMBER:   Do not eat food or drink liquids AFTER MIDNIGHT              Take these medicines the morning of surgery with               A SIPS OF WATER :  Hydrocodone  If needed for pain                  STOP ASPIRIN / Stephen Costa / IBUPROFEN / VITAMINS / HERBAL MEDS 5 DAYS PREOP    Do not wear jewelry, make-up   Do not wear lotions, powders, or perfumes.   Do not shave legs or underarms 12 hrs. before surgery (men may shave face)  Do not bring valuables to the hospital.  Contacts, dentures or bridgework may not be worn into surgery.  Leave suitcase in the car. After surgery it may be brought to your room.  For patients admitted to the hospital more than one night, checkout time is            11:00 AM                BRING C PAP [place]                                                    The day of discharge.   Patients discharged the day of surgery will not be allowed to drive home.            If going home same day of surgery, must have someone stay with you              FIRST 24 hrs at home and arrange for some one to drive you              home from hospital.   ________________________________________________________________________  [place]  Before surgery, you can play an important role.  Because skin is not sterile, your skin needs to be as free of germs as possible.  You can reduce the number of germs on your skin by washing with CHG  (chlorahexidine gluconate) soap before surgery.  CHG is an antiseptic cleaner which kills germs and bonds with the skin to continue killing germs even after washing. Please DO NOT use if you have an allergy to CHG or antibacterial soaps.  If your skin becomes reddened/irritated stop using the CHG and inform your nurse when you arrive at Short Stay. Do not shave (including legs and underarms) for at least 48 hours prior to the first CHG shower.  You may shave your face. Please follow these instructions carefully:   1.  Shower with CHG Soap the night before surgery and the  morning of Surgery.   2.  If you choose to wash your hair, wash your hair first as usual with your  normal  Shampoo.   3.  After you shampoo, rinse your hair and body thoroughly to remove the  shampoo.                                         4.  Use CHG as you would any other liquid soap.  You can apply chg directly  to the skin and wash . Gently wash with scrungie or clean wascloth    5.  Apply the CHG Soap to your body ONLY FROM THE NECK DOWN.   Do not use on open                           Wound or open sores. Avoid contact with eyes, ears mouth and genitals (private parts).                        Genitals (private parts) with your normal soap.              6.  Wash thoroughly, paying special attention to the area where your surgery  will be performed.   7.  Thoroughly rinse your body with warm water from the neck down.   8.  DO NOT shower/wash with your normal soap after using and rinsing off  the CHG Soap .                9.  Pat yourself dry with a clean towel.             10.  Wear clean pajamas.             11.  Place clean sheets on your bed the night of your first shower and do not  sleep with pets.  Day of Surgery : Do not apply any lotions/deodorants the morning of surgery.  Please wear clean clothes to the hospital/surgery center.  FAILURE TO FOLLOW THESE INSTRUCTIONS MAY RESULT IN THE CANCELLATION OF  YOUR SURGERY    PATIENT SIGNATURE_________________________________    WHAT IS A BLOOD TRANSFUSION? Blood Transfusion Information  A transfusion is the replacement of blood or some of its parts. Blood is made up of multiple cells which provide different functions.  Red blood cells carry oxygen and are used for blood loss replacement.  White blood cells fight against infection.  Platelets control bleeding.  Plasma helps clot blood.  Other blood products are available for specialized needs, such as hemophilia or other clotting disorders. BEFORE THE TRANSFUSION  Who gives blood for transfusions?   Healthy volunteers who are fully evaluated to make sure their blood is safe. This is blood bank blood. Transfusion therapy is the safest it has ever been in the practice of medicine. Before blood is taken from a donor, a complete history is taken to make sure that person has no history of diseases nor engages in risky social behavior (examples are intravenous drug use or sexual activity with multiple partners). The donor's travel history is screened to minimize risk of transmitting infections, such as malaria. The donated blood is tested for signs of infectious diseases, such as HIV and hepatitis. The blood is then tested to be sure it is compatible with you in order to minimize the chance of a transfusion reaction. If you or a relative donates blood, this is often done in anticipation of surgery and is not appropriate for emergency situations. It takes many days to process the donated blood. RISKS AND COMPLICATIONS Although transfusion therapy is very safe and saves many lives, the main dangers of transfusion include:   Getting an infectious disease.  Developing a transfusion reaction. This is an allergic reaction to something in the blood you were given. Every precaution is taken to prevent this. The decision to have a blood transfusion has been considered carefully by your caregiver before  blood is given. Blood is not given unless the benefits outweigh the risks. AFTER THE TRANSFUSION  Right after receiving a blood transfusion, you will usually feel much better and more energetic. This is especially true if your red blood cells have gotten low (anemic). The transfusion raises the level of the red blood cells which carry oxygen, and this usually causes an energy increase.  The nurse administering the transfusion will monitor you carefully for complications. HOME CARE INSTRUCTIONS  No special instructions are needed after a transfusion. You may find your energy is better. Speak with your caregiver about any limitations on activity for underlying diseases you may have. SEEK MEDICAL CARE IF:   Your condition is not improving after your transfusion.  You develop redness or irritation at the intravenous (IV) site. SEEK IMMEDIATE MEDICAL CARE IF:  Any of the following symptoms occur over the next 12 hours:  Shaking chills.  You have a temperature by mouth above 102 F (38.9 C), not controlled by medicine.  Chest, back, or muscle pain.  People around you feel you are not acting correctly or are confused.  Shortness of breath or difficulty breathing.  Dizziness and fainting.  You get a rash or develop hives.  You have a decrease in urine output.  Your urine turns a dark color or changes to pink, red, or brown. Any of the following symptoms occur over the next 10 days:  You have a temperature by mouth above 102 F (38.9 C), not controlled by medicine.  Shortness of breath.  Weakness after normal activity.  The white part of the eye turns yellow (jaundice).  You have a decrease in the amount of urine or are urinating less often.  Your urine turns a dark color or changes to pink, red, or brown. Document Released: 09/12/2000 Document Revised: 12/08/2011 Document Reviewed: 05/01/2008 [facility] Patient Information 2014 [place],  [place].  _______________________________________________________________________

## 2014-02-15 ENCOUNTER — Encounter (HOSPITAL_COMMUNITY)
Admission: RE | Admit: 2014-02-15 | Discharge: 2014-02-15 | Disposition: A | Discharge status: Home / Self Care | Payer: Managed Care, Other (non HMO) | Source: Ambulatory Visit | Attending: Surgery | Admitting: Surgery

## 2014-02-15 ENCOUNTER — Encounter (HOSPITAL_COMMUNITY): Payer: Self-pay

## 2014-02-15 DIAGNOSIS — Z01812 Encounter for preprocedural laboratory examination: Secondary | ICD-10-CM | POA: Insufficient documentation

## 2014-02-15 LAB — CBC
HEMATOCRIT: 46.6 % (ref 39.0–52.0)
HEMOGLOBIN: 16.7 g/dL (ref 13.0–17.0)
MCH: 33.4 pg (ref 26.0–34.0)
MCHC: 35.8 g/dL (ref 30.0–36.0)
MCV: 93.2 fL (ref 78.0–100.0)
Platelets: 339 10*3/uL (ref 150–400)
RBC: 5 MIL/uL (ref 4.22–5.81)
RDW: 13.3 % (ref 11.5–15.5)
WBC: 6.6 10*3/uL (ref 4.0–10.5)

## 2014-02-15 LAB — BASIC METABOLIC PANEL
BUN: 12 mg/dL (ref 6–23)
CO2: 22 meq/L (ref 19–32)
Calcium: 9.6 mg/dL (ref 8.4–10.5)
Chloride: 103 mEq/L (ref 96–112)
Creatinine, Ser: 0.68 mg/dL (ref 0.50–1.35)
GFR calc Af Amer: >90 mL/min (ref >90)
Glucose, Bld: 103 mg/dL — ABNORMAL HIGH (ref 70–99)
Potassium: 4.5 mEq/L (ref 3.7–5.3)
Sodium: 140 mEq/L (ref 137–147)

## 2014-02-15 NOTE — Consult Note (Signed)
WOC ostomy consult note Patient seen per Dr. Johney Maine' request for preoperative stoma site selection for RLQ loop ileostomy. Date of surgery is Tuesday, 02/21/14. Patient assessed in the standing and sitting positions.  Rotund abdomen noted with patient wearing elastic waistband well below waistline.  Site selected in the RLQ is 7cm to the right of the umbilicus and 2.9BM below. Elta [place] is made with a skin marking pan and covered with a thin film transparent dressing. Education provided: Patient understands the need for the ostomy surgery and believes it will be a temporary stoma.  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  Patient has good support from his fiance who will be present for teaching sessions during the hospitalization  Patient has the goal of independence with his ostomy care. Pouch sample is shown to patient today and appropriate questions are answered. Patient declines teaching booklet today, but will look forward to sessions post-operatively.  He is provided my business card should he have any questions over the weekend pertaining to ostomy. [facility] nursing team will follow, and will remain available to this patient, the nursing and medical team.   Thanks, Maudie Flakes, MSN, RN, [place], [place], [place] [phone])

## 2014-02-20 NOTE — Anesthesia Preprocedure Evaluation (Addendum)
Anesthesia Evaluation  Patient identified by MRN, date of birth, ID band Patient awake    Reviewed: Allergy & Precautions, H&P , NPO status , Patient's Chart, lab work & pertinent test results  Airway Mallampati: II      Dental   Pulmonary sleep apnea , Current Smoker,          Cardiovascular negative cardio ROS      Neuro/Psych negative neurological ROS  negative psych ROS   GI/Hepatic negative GI ROS, Neg liver ROS,   Endo/Other  Morbid obesity  Renal/GU negative Renal ROS     Musculoskeletal negative musculoskeletal ROS (+)   Abdominal (+) + obese,   Peds  Hematology negative hematology ROS (+)   Anesthesia Other Findings   Reproductive/Obstetrics negative OB ROS                          Anesthesia Physical Anesthesia Plan  ASA: II  Anesthesia Plan: General   Post-op Pain Management:    Induction: Intravenous  Airway Management Planned: Oral ETT  Additional Equipment: Arterial line  Intra-op Plan:   Post-operative Plan: Extubation in OR  Informed Consent: I have reviewed the patients History and Physical, chart, labs and discussed the procedure including the risks, benefits and alternatives for the proposed anesthesia with the patient or authorized representative who has indicated his/her understanding and acceptance.   Dental advisory given  Plan Discussed with: CRNA  Anesthesia Plan Comments: (2X PIV)        Anesthesia Quick Evaluation

## 2014-02-21 ENCOUNTER — Inpatient Hospital Stay (HOSPITAL_COMMUNITY)
Admission: RE | Admit: 2014-02-21 | Discharge: 2014-02-26 | DRG: 330 | Disposition: A | Discharge status: Home Health | Payer: Managed Care, Other (non HMO) | Source: Ambulatory Visit | Attending: Surgery | Admitting: Surgery

## 2014-02-21 ENCOUNTER — Encounter (HOSPITAL_COMMUNITY): Payer: Self-pay | Admitting: *Deleted

## 2014-02-21 ENCOUNTER — Inpatient Hospital Stay (HOSPITAL_COMMUNITY): Payer: Managed Care, Other (non HMO) | Admitting: Anesthesiology

## 2014-02-21 ENCOUNTER — Inpatient Hospital Stay (HOSPITAL_COMMUNITY): Payer: Managed Care, Other (non HMO)

## 2014-02-21 ENCOUNTER — Encounter (HOSPITAL_COMMUNITY)
Admission: RE | Disposition: A | Discharge status: Home Health | Payer: Self-pay | Source: Ambulatory Visit | Attending: Surgery

## 2014-02-21 ENCOUNTER — Encounter (HOSPITAL_COMMUNITY): Payer: Managed Care, Other (non HMO) | Admitting: Anesthesiology

## 2014-02-21 DIAGNOSIS — R209 Unspecified disturbances of skin sensation: Secondary | ICD-10-CM | POA: Diagnosis present

## 2014-02-21 DIAGNOSIS — C20 Malignant neoplasm of rectum: Secondary | ICD-10-CM | POA: Diagnosis present

## 2014-02-21 DIAGNOSIS — R000 Tachycardia, unspecified: Secondary | ICD-10-CM | POA: Diagnosis not present

## 2014-02-21 DIAGNOSIS — E669 Obesity, unspecified: Secondary | ICD-10-CM | POA: Diagnosis present

## 2014-02-21 DIAGNOSIS — Z01812 Encounter for preprocedural laboratory examination: Secondary | ICD-10-CM

## 2014-02-21 DIAGNOSIS — K429 Umbilical hernia without obstruction or gangrene: Secondary | ICD-10-CM | POA: Diagnosis present

## 2014-02-21 DIAGNOSIS — F17200 Nicotine dependence, unspecified, uncomplicated: Secondary | ICD-10-CM | POA: Diagnosis present

## 2014-02-21 DIAGNOSIS — M1710 Unilateral primary osteoarthritis, unspecified knee: Secondary | ICD-10-CM | POA: Diagnosis present

## 2014-02-21 DIAGNOSIS — Q438 Other specified congenital malformations of intestine: Secondary | ICD-10-CM

## 2014-02-21 DIAGNOSIS — Z720 Tobacco use: Secondary | ICD-10-CM | POA: Diagnosis present

## 2014-02-21 DIAGNOSIS — Z932 Ileostomy status: Secondary | ICD-10-CM

## 2014-02-21 DIAGNOSIS — C19 Malignant neoplasm of rectosigmoid junction: Secondary | ICD-10-CM

## 2014-02-21 DIAGNOSIS — R0902 Hypoxemia: Secondary | ICD-10-CM | POA: Diagnosis not present

## 2014-02-21 DIAGNOSIS — G4733 Obstructive sleep apnea (adult) (pediatric): Secondary | ICD-10-CM | POA: Diagnosis present

## 2014-02-21 DIAGNOSIS — K560 Paralytic ileus: Secondary | ICD-10-CM | POA: Diagnosis not present

## 2014-02-21 DIAGNOSIS — Z6838 Body mass index (BMI) 38.0-38.9, adult: Secondary | ICD-10-CM

## 2014-02-21 DIAGNOSIS — Z9221 Personal history of antineoplastic chemotherapy: Secondary | ICD-10-CM

## 2014-02-21 DIAGNOSIS — IMO0002 Reserved for concepts with insufficient information to code with codable children: Secondary | ICD-10-CM

## 2014-02-21 HISTORY — PX: LOW ANTERIOR BOWEL RESECTION: SUR1240

## 2014-02-21 HISTORY — PX: DIVERTING ILEOSTOMY: SHX5799

## 2014-02-21 HISTORY — PX: PROCTOSCOPY: SHX2266

## 2014-02-21 LAB — BLOOD GAS, ARTERIAL
Acid-base deficit: 0.3 mmol/L (ref 0.0–2.0)
Bicarbonate: 23.9 mEq/L (ref 20.0–24.0)
Drawn by: 295031
FIO2: 1 %
MECHVT: 800 mL
O2 Saturation: 97.4 %
PEEP: 7 cmH2O
Patient temperature: 98.6
RATE: 13 resp/min
TCO2: 21.1 mmol/L (ref 0–100)
pCO2 arterial: 40.1 mmHg (ref 35.0–45.0)
pH, Arterial: 7.394 (ref 7.350–7.450)
pO2, Arterial: 104 mmHg — ABNORMAL HIGH (ref 80.0–100.0)

## 2014-02-21 LAB — POCT I-STAT 7, (LYTES, BLD GAS, ICA,H+H)
Acid-base deficit: 7 mmol/L — ABNORMAL HIGH (ref 0.0–2.0)
BICARBONATE: 17.2 meq/L — AB (ref 20.0–24.0)
Calcium, Ion: 1.02 mmol/L — ABNORMAL LOW (ref 1.12–1.23)
HCT: 31 % — ABNORMAL LOW (ref 39.0–52.0)
HEMOGLOBIN: 10.5 g/dL — AB (ref 13.0–17.0)
O2 SAT: 95 %
PCO2 ART: 29.5 mmHg — AB (ref 35.0–45.0)
PH ART: 7.374 (ref 7.350–7.450)
PO2 ART: 79 mmHg — AB (ref 80.0–100.0)
Potassium: 2.8 mEq/L — CL (ref 3.7–5.3)
Sodium: 148 mEq/L — ABNORMAL HIGH (ref 137–147)
TCO2: 18 mmol/L (ref 0–100)

## 2014-02-21 LAB — ABO/RH: ABO/RH(D): B POS

## 2014-02-21 LAB — TYPE AND SCREEN
ABO/RH(D): B POS
ANTIBODY SCREEN: NEGATIVE

## 2014-02-21 SURGERY — ROBOT ASSISTED LAPAROSCOPIC PARTIAL COLECTOMY
Anesthesia: General | Site: Rectum

## 2014-02-21 MED ORDER — METHYLENE BLUE 1 % INJ SOLN
INTRAMUSCULAR | Status: AC
  Filled 2014-02-21: qty 10

## 2014-02-21 MED ORDER — NEOSTIGMINE METHYLSULFATE 10 MG/10ML IV SOLN
INTRAVENOUS | Status: DC | PRN
  Administered 2014-02-21: 2 mg via INTRAVENOUS

## 2014-02-21 MED ORDER — BUPIVACAINE 0.25 % ON-Q PUMP DUAL CATH 300 ML
300.0000 mL | INJECTION | Status: AC
  Filled 2014-02-21: qty 300

## 2014-02-21 MED ORDER — STERILE WATER FOR IRRIGATION IR SOLN
Status: DC | PRN
  Administered 2014-02-21: 1500 mL

## 2014-02-21 MED ORDER — SODIUM CHLORIDE 0.9 % IV SOLN
INTRAVENOUS | Status: DC
  Filled 2014-02-21: qty 6

## 2014-02-21 MED ORDER — ALUM & MAG HYDROXIDE-SIMETH 200-200-20 MG/5ML PO SUSP
30.0000 mL | Freq: Four times a day (QID) | ORAL | Status: DC | PRN
  Administered 2014-02-24 (×2): 30 mL via ORAL
  Filled 2014-02-21 (×2): qty 30

## 2014-02-21 MED ORDER — BUPIVACAINE-EPINEPHRINE (PF) 0.25% -1:200000 IJ SOLN
INTRAMUSCULAR | Status: AC
  Filled 2014-02-21: qty 30

## 2014-02-21 MED ORDER — MIDAZOLAM HCL 2 MG/2ML IJ SOLN
INTRAMUSCULAR | Status: AC
  Filled 2014-02-21: qty 2

## 2014-02-21 MED ORDER — CISATRACURIUM BESYLATE 20 MG/10ML IV SOLN
INTRAVENOUS | Status: AC
  Filled 2014-02-21: qty 10

## 2014-02-21 MED ORDER — LACTATED RINGERS IV BOLUS (SEPSIS): 1000.0000 mL | Freq: Three times a day (TID) | INTRAVENOUS | Status: AC | PRN

## 2014-02-21 MED ORDER — METOPROLOL TARTRATE 1 MG/ML IV SOLN
5.0000 mg | Freq: Four times a day (QID) | INTRAVENOUS | Status: DC | PRN
  Filled 2014-02-21: qty 5

## 2014-02-21 MED ORDER — PNEUMOCOCCAL VAC POLYVALENT 25 MCG/0.5ML IJ INJ
0.5000 mL | INJECTION | INTRAMUSCULAR | Status: AC
  Administered 2014-02-22: 0.5 mL via INTRAMUSCULAR
  Filled 2014-02-21 (×2): qty 0.5

## 2014-02-21 MED ORDER — CHLORHEXIDINE GLUCONATE 0.12 % MT SOLN
15.0000 mL | Freq: Two times a day (BID) | OROMUCOSAL | Status: DC
  Administered 2014-02-21 – 2014-02-24 (×5): 15 mL via OROMUCOSAL
  Filled 2014-02-21 (×11): qty 15

## 2014-02-21 MED ORDER — ALBUTEROL SULFATE HFA 108 (90 BASE) MCG/ACT IN AERS
INHALATION_SPRAY | RESPIRATORY_TRACT | Status: AC
  Filled 2014-02-21: qty 6.7

## 2014-02-21 MED ORDER — LACTATED RINGERS IR SOLN
Status: DC | PRN
  Administered 2014-02-21: 1000 mL
  Administered 2014-02-21: 17:00:00

## 2014-02-21 MED ORDER — ALBUTEROL SULFATE HFA 108 (90 BASE) MCG/ACT IN AERS
INHALATION_SPRAY | RESPIRATORY_TRACT | Status: DC | PRN
  Administered 2014-02-21 (×3): 5 via RESPIRATORY_TRACT

## 2014-02-21 MED ORDER — CEFOTETAN DISODIUM-DEXTROSE 2-2.08 GM-% IV SOLR
INTRAVENOUS | Status: AC
  Filled 2014-02-21: qty 50

## 2014-02-21 MED ORDER — HEPARIN SODIUM (PORCINE) 5000 UNIT/ML IJ SOLN
5000.0000 [IU] | Freq: Once | INTRAMUSCULAR | Status: AC
  Administered 2014-02-21: 5000 [IU] via SUBCUTANEOUS
  Filled 2014-02-21: qty 1

## 2014-02-21 MED ORDER — HYDROMORPHONE HCL PF 1 MG/ML IJ SOLN
0.2500 mg | INTRAMUSCULAR | Status: DC | PRN
  Administered 2014-02-21 (×2): 0.25 mg via INTRAVENOUS

## 2014-02-21 MED ORDER — BUPIVACAINE ON-Q PAIN PUMP (FOR ORDER SET NO CHG)
INJECTION | Status: DC
  Filled 2014-02-21: qty 1

## 2014-02-21 MED ORDER — PROCHLORPERAZINE MALEATE 10 MG PO TABS
10.0000 mg | ORAL_TABLET | Freq: Four times a day (QID) | ORAL | Status: DC | PRN
  Filled 2014-02-21: qty 1

## 2014-02-21 MED ORDER — EPHEDRINE SULFATE 50 MG/ML IJ SOLN
INTRAMUSCULAR | Status: AC
  Filled 2014-02-21: qty 1

## 2014-02-21 MED ORDER — DIPHENHYDRAMINE HCL 50 MG/ML IJ SOLN: 12.5000 mg | Freq: Four times a day (QID) | INTRAMUSCULAR | Status: DC | PRN

## 2014-02-21 MED ORDER — SALINE SPRAY 0.65 % NA SOLN
1.0000 | NASAL | Status: DC | PRN
  Administered 2014-02-23: 1 via NASAL
  Filled 2014-02-21: qty 44

## 2014-02-21 MED ORDER — NEOSTIGMINE METHYLSULFATE 10 MG/10ML IV SOLN
INTRAVENOUS | Status: AC
  Filled 2014-02-21: qty 1

## 2014-02-21 MED ORDER — PROMETHAZINE HCL 25 MG/ML IJ SOLN: 6.2500 mg | INTRAMUSCULAR | Status: DC | PRN

## 2014-02-21 MED ORDER — PROPOFOL 10 MG/ML IV BOLUS
INTRAVENOUS | Status: AC
  Filled 2014-02-21: qty 20

## 2014-02-21 MED ORDER — FENTANYL CITRATE 0.05 MG/ML IJ SOLN
INTRAMUSCULAR | Status: DC | PRN
  Administered 2014-02-21 (×9): 50 ug via INTRAVENOUS

## 2014-02-21 MED ORDER — HYDROCODONE-ACETAMINOPHEN 5-325 MG PO TABS: 1.0000 | ORAL_TABLET | ORAL | Status: DC | PRN

## 2014-02-21 MED ORDER — LACTATED RINGERS IV SOLN
INTRAVENOUS | Status: DC | PRN
  Administered 2014-02-21 (×3): via INTRAVENOUS

## 2014-02-21 MED ORDER — ALVIMOPAN 12 MG PO CAPS
12.0000 mg | ORAL_CAPSULE | Freq: Two times a day (BID) | ORAL | Status: DC
  Administered 2014-02-22 – 2014-02-25 (×8): 12 mg via ORAL
  Filled 2014-02-21 (×10): qty 1

## 2014-02-21 MED ORDER — LACTATED RINGERS IV BOLUS (SEPSIS): 1000.0000 mL | Freq: Once | INTRAVENOUS | Status: DC

## 2014-02-21 MED ORDER — BUPIVACAINE-EPINEPHRINE 0.25% -1:200000 IJ SOLN
INTRAMUSCULAR | Status: DC | PRN
  Administered 2014-02-21: 85 mL

## 2014-02-21 MED ORDER — ONDANSETRON HCL 4 MG/2ML IJ SOLN
INTRAMUSCULAR | Status: AC
  Filled 2014-02-21: qty 2

## 2014-02-21 MED ORDER — SODIUM CHLORIDE 0.9 % IJ SOLN
INTRAMUSCULAR | Status: AC
  Filled 2014-02-21: qty 3

## 2014-02-21 MED ORDER — ONDANSETRON HCL 4 MG/2ML IJ SOLN
INTRAMUSCULAR | Status: DC | PRN
  Administered 2014-02-21 (×2): 4 mg via INTRAVENOUS

## 2014-02-21 MED ORDER — PROMETHAZINE HCL 25 MG/ML IJ SOLN: 6.2500 mg | Freq: Four times a day (QID) | INTRAMUSCULAR | Status: DC | PRN

## 2014-02-21 MED ORDER — LIP MEDEX EX OINT
1.0000 "application " | TOPICAL_OINTMENT | Freq: Two times a day (BID) | CUTANEOUS | Status: DC
  Administered 2014-02-21 – 2014-02-26 (×7): 1 via TOPICAL
  Filled 2014-02-21: qty 7

## 2014-02-21 MED ORDER — 0.9 % SODIUM CHLORIDE (POUR BTL) OPTIME
TOPICAL | Status: DC | PRN
  Administered 2014-02-21: 2000 mL

## 2014-02-21 MED ORDER — BIOTENE DRY MOUTH MT LIQD
15.0000 mL | Freq: Two times a day (BID) | OROMUCOSAL | Status: DC
  Administered 2014-02-23 – 2014-02-24 (×3): 15 mL via OROMUCOSAL

## 2014-02-21 MED ORDER — KCL IN DEXTROSE-NACL 40-5-0.9 MEQ/L-%-% IV SOLN
INTRAVENOUS | Status: DC
  Administered 2014-02-21 – 2014-02-25 (×6): via INTRAVENOUS
  Filled 2014-02-21 (×7): qty 1000

## 2014-02-21 MED ORDER — GLYCOPYRROLATE 0.2 MG/ML IJ SOLN
INTRAMUSCULAR | Status: DC | PRN
  Administered 2014-02-21: .2 mg via INTRAVENOUS

## 2014-02-21 MED ORDER — HEPARIN SODIUM (PORCINE) 5000 UNIT/ML IJ SOLN
5000.0000 [IU] | Freq: Three times a day (TID) | INTRAMUSCULAR | Status: DC
  Administered 2014-02-22 – 2014-02-26 (×14): 5000 [IU] via SUBCUTANEOUS
  Filled 2014-02-21 (×16): qty 1

## 2014-02-21 MED ORDER — HYDROMORPHONE HCL PF 1 MG/ML IJ SOLN
INTRAMUSCULAR | Status: AC
  Filled 2014-02-21: qty 1

## 2014-02-21 MED ORDER — DIPHENHYDRAMINE HCL 12.5 MG/5ML PO ELIX: 12.5000 mg | ORAL_SOLUTION | Freq: Four times a day (QID) | ORAL | Status: DC | PRN

## 2014-02-21 MED ORDER — SACCHAROMYCES BOULARDII 250 MG PO CAPS
250.0000 mg | ORAL_CAPSULE | Freq: Two times a day (BID) | ORAL | Status: DC
  Administered 2014-02-21 – 2014-02-26 (×10): 250 mg via ORAL
  Filled 2014-02-21 (×11): qty 1

## 2014-02-21 MED ORDER — ACETAMINOPHEN 10 MG/ML IV SOLN
1000.0000 mg | Freq: Once | INTRAVENOUS | Status: AC
  Administered 2014-02-21: 1000 mg via INTRAVENOUS
  Filled 2014-02-21: qty 100

## 2014-02-21 MED ORDER — FENTANYL CITRATE 0.05 MG/ML IJ SOLN
INTRAMUSCULAR | Status: AC
  Filled 2014-02-21: qty 5

## 2014-02-21 MED ORDER — CEFOTETAN DISODIUM 2 G IJ SOLR
2.0000 g | Freq: Two times a day (BID) | INTRAMUSCULAR | Status: AC
  Administered 2014-02-21: 2 g via INTRAVENOUS
  Filled 2014-02-21: qty 2

## 2014-02-21 MED ORDER — LIDOCAINE HCL (CARDIAC) 20 MG/ML IV SOLN
INTRAVENOUS | Status: DC | PRN
Start: 2014-02-21 — End: 2014-02-21
  Administered 2014-02-21: 100 mg via INTRAVENOUS

## 2014-02-21 MED ORDER — DEXAMETHASONE SODIUM PHOSPHATE 10 MG/ML IJ SOLN
INTRAMUSCULAR | Status: DC | PRN
  Administered 2014-02-21: 10 mg via INTRAVENOUS

## 2014-02-21 MED ORDER — HYDROMORPHONE HCL PF 1 MG/ML IJ SOLN
0.5000 mg | INTRAMUSCULAR | Status: DC | PRN
  Administered 2014-02-21 – 2014-02-22 (×6): 1 mg via INTRAVENOUS
  Administered 2014-02-22 (×2): 2 mg via INTRAVENOUS
  Administered 2014-02-22 (×2): 1 mg via INTRAVENOUS
  Administered 2014-02-22: 2 mg via INTRAVENOUS
  Administered 2014-02-22: 1 mg via INTRAVENOUS
  Administered 2014-02-22 (×2): 2 mg via INTRAVENOUS
  Administered 2014-02-22 (×2): 1 mg via INTRAVENOUS
  Administered 2014-02-22 – 2014-02-23 (×2): 2 mg via INTRAVENOUS
  Administered 2014-02-23: 1 mg via INTRAVENOUS
  Filled 2014-02-21 (×3): qty 1
  Filled 2014-02-21: qty 2
  Filled 2014-02-21 (×2): qty 1
  Filled 2014-02-21 (×2): qty 2
  Filled 2014-02-21 (×4): qty 1
  Filled 2014-02-21: qty 2
  Filled 2014-02-21 (×5): qty 1
  Filled 2014-02-21 (×2): qty 2

## 2014-02-21 MED ORDER — LIDOCAINE HCL (CARDIAC) 20 MG/ML IV SOLN
INTRAVENOUS | Status: AC
  Filled 2014-02-21: qty 5

## 2014-02-21 MED ORDER — BUPIVACAINE 0.25 % ON-Q PUMP DUAL CATH 300 ML
300.0000 mL | INJECTION | Status: DC
  Filled 2014-02-21: qty 300

## 2014-02-21 MED ORDER — SODIUM CHLORIDE 0.9 % IJ SOLN
INTRAMUSCULAR | Status: AC
  Filled 2014-02-21: qty 10

## 2014-02-21 MED ORDER — SUCCINYLCHOLINE CHLORIDE 20 MG/ML IJ SOLN
INTRAMUSCULAR | Status: DC | PRN
  Administered 2014-02-21: 140 mg via INTRAVENOUS

## 2014-02-21 MED ORDER — BUPIVACAINE-EPINEPHRINE (PF) 0.25% -1:200000 IJ SOLN
INTRAMUSCULAR | Status: AC
  Filled 2014-02-21: qty 60

## 2014-02-21 MED ORDER — CISATRACURIUM BESYLATE (PF) 10 MG/5ML IV SOLN
INTRAVENOUS | Status: DC | PRN
  Administered 2014-02-21: 2 mg via INTRAVENOUS
  Administered 2014-02-21 (×2): 4 mg via INTRAVENOUS
  Administered 2014-02-21: 6 mg via INTRAVENOUS
  Administered 2014-02-21: 4 mg via INTRAVENOUS
  Administered 2014-02-21: 10 mg via INTRAVENOUS
  Administered 2014-02-21 (×3): 6 mg via INTRAVENOUS

## 2014-02-21 MED ORDER — LACTATED RINGERS IV SOLN
INTRAVENOUS | Status: DC | PRN
  Administered 2014-02-21 (×3): via INTRAVENOUS

## 2014-02-21 MED ORDER — PROPOFOL 10 MG/ML IV BOLUS
INTRAVENOUS | Status: DC | PRN
  Administered 2014-02-21: 200 mg via INTRAVENOUS

## 2014-02-21 MED ORDER — MIDAZOLAM HCL 5 MG/5ML IJ SOLN
INTRAMUSCULAR | Status: DC | PRN
  Administered 2014-02-21 (×2): .5 mg via INTRAVENOUS

## 2014-02-21 MED ORDER — ALVIMOPAN 12 MG PO CAPS
12.0000 mg | ORAL_CAPSULE | Freq: Once | ORAL | Status: AC
  Administered 2014-02-21: 12 mg via ORAL
  Filled 2014-02-21: qty 1

## 2014-02-21 MED ORDER — METHYLENE BLUE 1 % INJ SOLN
INTRAMUSCULAR | Status: DC | PRN
  Administered 2014-02-21: 8 mL

## 2014-02-21 MED ORDER — DEXTROSE 5 % IV SOLN
2.0000 g | INTRAVENOUS | Status: AC
  Administered 2014-02-21 (×2): 2 g via INTRAVENOUS

## 2014-02-21 MED ORDER — DEXAMETHASONE SODIUM PHOSPHATE 10 MG/ML IJ SOLN
INTRAMUSCULAR | Status: AC
  Filled 2014-02-21: qty 1

## 2014-02-21 MED ORDER — MEPERIDINE HCL 50 MG/ML IJ SOLN: 6.2500 mg | INTRAMUSCULAR | Status: DC | PRN

## 2014-02-21 MED ORDER — ACETAMINOPHEN 500 MG PO TABS
1000.0000 mg | ORAL_TABLET | Freq: Three times a day (TID) | ORAL | Status: DC
  Administered 2014-02-21 – 2014-02-22 (×4): 1000 mg via ORAL
  Filled 2014-02-21 (×8): qty 2

## 2014-02-21 MED ORDER — EPHEDRINE SULFATE 50 MG/ML IJ SOLN
INTRAMUSCULAR | Status: DC | PRN
  Administered 2014-02-21: 10 mg via INTRAVENOUS

## 2014-02-21 MED ORDER — ATROPINE SULFATE 0.4 MG/ML IJ SOLN
INTRAMUSCULAR | Status: AC
Start: 2014-02-21 — End: 2014-02-21
  Filled 2014-02-21: qty 1

## 2014-02-21 MED ORDER — MAGIC MOUTHWASH
15.0000 mL | Freq: Four times a day (QID) | ORAL | Status: DC | PRN
Start: 2014-02-21 — End: 2014-02-26
  Administered 2014-02-25: 15 mL via ORAL
  Filled 2014-02-21: qty 15

## 2014-02-21 MED ORDER — GLYCOPYRROLATE 0.2 MG/ML IJ SOLN
INTRAMUSCULAR | Status: AC
  Filled 2014-02-21: qty 3

## 2014-02-21 SURGICAL SUPPLY — 115 items
APPLIER CLIP 5 13 M/L LIGAMAX5 (MISCELLANEOUS)
APPLIER CLIP ROT 10 11.4 M/L (STAPLE)
BAG URINE DRAINAGE (UROLOGICAL SUPPLIES) ×4 IMPLANT
BLADE EXTENDED COATED 6.5IN (ELECTRODE) ×4 IMPLANT
BLADE HEX COATED 2.75 (ELECTRODE) ×8 IMPLANT
BLADE SURG SZ10 CARB STEEL (BLADE) ×4 IMPLANT
CABLE HIGH FREQUENCY MONO STRZ (ELECTRODE) IMPLANT
CANISTER SUCTION 2500CC (MISCELLANEOUS) IMPLANT
CATH FOLEY 2WAY SLVR 30CC 24FR (CATHETERS) ×4 IMPLANT
CATH KIT ON Q 7.5IN SLV (PAIN MANAGEMENT) ×8 IMPLANT
CATH ROBINSON RED A/P 16FR (CATHETERS) ×4 IMPLANT
CELLS DAT CNTRL 66122 CELL SVR (MISCELLANEOUS) IMPLANT
CHLORAPREP W/TINT 26ML (MISCELLANEOUS) ×4 IMPLANT
CLIP APPLIE 5 13 M/L LIGAMAX5 (MISCELLANEOUS) IMPLANT
CLIP APPLIE ROT 10 11.4 M/L (STAPLE) IMPLANT
CLIP LIGATING HEM O LOK PURPLE (MISCELLANEOUS) ×4 IMPLANT
CLIP LIGATING HEMO O LOK GREEN (MISCELLANEOUS) IMPLANT
CLIP LIGATING HEMOLOK MED (MISCELLANEOUS) IMPLANT
CLIP TI LARGE 6 (CLIP) ×8 IMPLANT
CORDS BIPOLAR (ELECTRODE) ×4 IMPLANT
COVER MAYO STAND STRL (DRAPES) ×12 IMPLANT
COVER SURGICAL LIGHT HANDLE (MISCELLANEOUS) ×8 IMPLANT
COVER TIP SHEARS 8 DVNC (MISCELLANEOUS) ×2 IMPLANT
COVER TIP SHEARS 8MM DA VINCI (MISCELLANEOUS) ×2
DECANTER SPIKE VIAL GLASS SM (MISCELLANEOUS) ×4 IMPLANT
DEVICE TROCAR PUNCTURE CLOSURE (ENDOMECHANICALS) ×4 IMPLANT
DRAIN CHANNEL 19F RND (DRAIN) ×4 IMPLANT
DRAPE ARM DVNC X/XI (DISPOSABLE) ×8 IMPLANT
DRAPE CAMERA CLOSED 9X96 (DRAPES) ×4 IMPLANT
DRAPE COLUMN DVNC XI (DISPOSABLE) ×2 IMPLANT
DRAPE DA VINCI XI ARM (DISPOSABLE) ×8
DRAPE DA VINCI XI COLUMN (DISPOSABLE) ×2
DRAPE LG THREE QUARTER DISP (DRAPES) ×4 IMPLANT
DRAPE UTILITY XL STRL (DRAPES) ×8 IMPLANT
DRAPE WARM FLUID 44X44 (DRAPE) ×8 IMPLANT
DRSG OPSITE POSTOP 4X10 (GAUZE/BANDAGES/DRESSINGS) ×4 IMPLANT
DRSG OPSITE POSTOP 4X6 (GAUZE/BANDAGES/DRESSINGS) IMPLANT
DRSG OPSITE POSTOP 4X8 (GAUZE/BANDAGES/DRESSINGS) IMPLANT
DRSG TEGADERM 2-3/8X2-3/4 SM (GAUZE/BANDAGES/DRESSINGS) ×24 IMPLANT
DRSG TEGADERM 4X4.75 (GAUZE/BANDAGES/DRESSINGS) ×4 IMPLANT
ELECT REM PT RETURN 9FT ADLT (ELECTROSURGICAL) ×4
ELECTRODE REM PT RTRN 9FT ADLT (ELECTROSURGICAL) ×2 IMPLANT
ENDOLOOP SUT PDS II  0 18 (SUTURE)
ENDOLOOP SUT PDS II 0 18 (SUTURE) IMPLANT
EVACUATOR SILICONE 100CC (DRAIN) ×4 IMPLANT
GAUZE SPONGE 2X2 8PLY STRL LF (GAUZE/BANDAGES/DRESSINGS) ×2 IMPLANT
GLOVE BIO SURGEON STRL SZ 6.5 (GLOVE) ×12 IMPLANT
GLOVE BIO SURGEONS STRL SZ 6.5 (GLOVE) ×4
GLOVE BIOGEL PI IND STRL 7.0 (GLOVE) ×10 IMPLANT
GLOVE BIOGEL PI INDICATOR 7.0 (GLOVE) ×10
GLOVE ECLIPSE 8.0 STRL XLNG CF (GLOVE) ×20 IMPLANT
GLOVE INDICATOR 8.0 STRL GRN (GLOVE) ×20 IMPLANT
GLOVE SURG SS PI 6.5 STRL IVOR (GLOVE) ×12 IMPLANT
GOWN STRL REUS W/ TWL LRG LVL3 (GOWN DISPOSABLE) ×2 IMPLANT
GOWN STRL REUS W/TWL 2XL LVL3 (GOWN DISPOSABLE) ×12 IMPLANT
GOWN STRL REUS W/TWL LRG LVL3 (GOWN DISPOSABLE) ×2
GOWN STRL REUS W/TWL XL LVL3 (GOWN DISPOSABLE) ×28 IMPLANT
KIT BASIN OR (CUSTOM PROCEDURE TRAY) ×8 IMPLANT
LEGGING LITHOTOMY PAIR STRL (DRAPES) ×4 IMPLANT
NEEDLE HYPO 25X1 1.5 SAFETY (NEEDLE) ×4 IMPLANT
PACK CARDIOVASCULAR III (CUSTOM PROCEDURE TRAY) ×4 IMPLANT
PACK GENERAL/GYN (CUSTOM PROCEDURE TRAY) ×4 IMPLANT
PENCIL BUTTON HOLSTER BLD 10FT (ELECTRODE) ×4 IMPLANT
PUMP PAIN ON-Q (MISCELLANEOUS) ×4 IMPLANT
RTRCTR WOUND ALEXIS 18CM MED (MISCELLANEOUS)
SCISSORS LAP 5X35 DISP (ENDOMECHANICALS) IMPLANT
SCRUB PCMX 4 OZ (MISCELLANEOUS) ×4 IMPLANT
SEAL CANN UNIV 5-8 DVNC XI (MISCELLANEOUS) ×8 IMPLANT
SEAL XI 5MM-8MM UNIVERSAL (MISCELLANEOUS) ×8
SEALER TISSUE G2 STRG ARTC 35C (ENDOMECHANICALS) IMPLANT
SEALER VESSEL DA VINCI XI (MISCELLANEOUS) ×2
SEALER VESSEL EXT DVNC XI (MISCELLANEOUS) ×2 IMPLANT
SET IRRIG TUBING LAPAROSCOPIC (IRRIGATION / IRRIGATOR) IMPLANT
SET IRRIG Y TYPE TUR BLADDER L (SET/KITS/TRAYS/PACK) IMPLANT
SET TUBE IRRIG SUCTION NO TIP (IRRIGATION / IRRIGATOR) ×4 IMPLANT
SLEEVE XCEL OPT CAN 5 100 (ENDOMECHANICALS) IMPLANT
SOLUTION ELECTROLUBE (MISCELLANEOUS) ×4 IMPLANT
SPONGE GAUZE 2X2 STER 10/PKG (GAUZE/BANDAGES/DRESSINGS) ×2
SPONGE GAUZE 4X4 12PLY (GAUZE/BANDAGES/DRESSINGS) IMPLANT
SPONGE LAP 18X18 X RAY DECT (DISPOSABLE) IMPLANT
STAPLER CIRC ILS CVD 33MM 37CM (STAPLE) ×4 IMPLANT
STAPLER CUT CVD 40MM BLUE (STAPLE) ×4 IMPLANT
STAPLER CUT RELOAD BLUE (STAPLE) ×4 IMPLANT
STAPLER CUT RELOAD GREEN (STAPLE) ×4 IMPLANT
SUCTION POOLE TIP (SUCTIONS) ×4 IMPLANT
SUT ETHILON 2 0 PS N (SUTURE) ×4 IMPLANT
SUT MNCRL AB 4-0 PS2 18 (SUTURE) ×8 IMPLANT
SUT PDS AB 1 CTX 36 (SUTURE) IMPLANT
SUT PDS AB 1 TP1 96 (SUTURE) ×8 IMPLANT
SUT PROLENE 0 CT 2 (SUTURE) ×4 IMPLANT
SUT SILK 2 0 (SUTURE) ×4
SUT SILK 2 0 SH CR/8 (SUTURE) ×4 IMPLANT
SUT SILK 2 0SH CR/8 30 (SUTURE) ×4 IMPLANT
SUT SILK 2-0 18XBRD TIE 12 (SUTURE) ×2 IMPLANT
SUT SILK 2-0 30XBRD TIE 12 (SUTURE) ×2 IMPLANT
SUT SILK 3 0 (SUTURE) ×2
SUT SILK 3 0 SH CR/8 (SUTURE) ×4 IMPLANT
SUT SILK 3-0 18XBRD TIE 12 (SUTURE) ×2 IMPLANT
SUT VIC AB 3-0 SH 8-18 (SUTURE) ×8 IMPLANT
SUT VICRYL 0 UR6 27IN ABS (SUTURE) ×8 IMPLANT
SYR 20CC LL (SYRINGE) ×8 IMPLANT
SYR BULB IRRIGATION 50ML (SYRINGE) IMPLANT
SYS LAPSCP GELPORT 120MM (MISCELLANEOUS)
SYSTEM LAPSCP GELPORT 120MM (MISCELLANEOUS) IMPLANT
TAPE UMBILICAL COTTON 1/8X30 (MISCELLANEOUS) IMPLANT
TOWEL OR 17X26 10 PK STRL BLUE (TOWEL DISPOSABLE) ×8 IMPLANT
TOWEL OR NON WOVEN STRL DISP B (DISPOSABLE) ×8 IMPLANT
TRAY FOLEY CATH 14FRSI W/METER (CATHETERS) ×4 IMPLANT
TROCAR 12M 150ML BLUNT (TROCAR) ×4 IMPLANT
TROCAR BLADELESS OPT 5 100 (ENDOMECHANICALS) ×4 IMPLANT
TUBING CONNECTING 10 (TUBING) IMPLANT
TUBING CONNECTING 10' (TUBING)
TUBING FILTER THERMOFLATOR (ELECTROSURGICAL) ×4 IMPLANT
TUNNELER SHEATH ON-Q 16GX12 DP (PAIN MANAGEMENT) IMPLANT
YANKAUER SUCT BULB TIP 10FT TU (MISCELLANEOUS) ×4 IMPLANT

## 2014-02-21 NOTE — H&P (Signed)
[place], MD, [address] [place], [place] [address] Phone: [phone] FAX: [place]  [birth year] [phone]  CARE TEAM:  PCP: Lynne Logan, MD  Outpatient Care Team: Patient Care Team: Lynne Logan, MD as PCP - General (Family Medicine) [name], MD as Consulting Physician (Gastroenterology) Adin Hector, MD as Consulting Physician (General Surgery) Ladell Pier, MD as Consulting Physician (Oncology) Marye Round, MD as Consulting Physician (Radiation Oncology) Carola Frost, RN as Registered Nurse  Inpatient Treatment Team: Treatment Team: Attending Provider: Adin Hector, MD  This patient is a 58 y.o.male who presents today for surgical evaluation.   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 Often goes with his wife. He has had intermittent rectal bleeding over the past few years. Based on concerns, he was sent for colonoscopy. A bulky mass was found at the rectosigmoid junction, Highly suspicious for cancer. Surgical consultation was recommended. He was put into our multidisciplinary gastrointestinal tumor board.   He struggles with left knee arthritis and is limited in his walking. He is obese. He does smoke about one pack a day. He claims he is cutting back. Later his wife felt like he had not done not very much. He has never had any abdominal surgery or anal rectal interventions. No exertional chest/neck/shoulder/arm pain. Patient can walk 15 minutes for about 1/4 miles without difficulty.   He has seen medical and radiation oncology. He just finished neoadjuvant chemoradiation therapy with oral Xeloda and external beam radiation on March 12. Still struggling with some pressure discomfort where the tumor is. It is hard for him to feel like he empties. Still with some rabbit pellet bowel  movements. Occasionally has flatus vs. stool exiting - improved. Trying to walk more. He has one decent meal a day. Intentionally lost some weight.  No major events   Past Medical History  Diagnosis Date  . Rectal cancer 09/26/2013  . Sleep apnea     wears cpap most nights  . Hemorrhoids   . Cancer 10/05/13    rectum  . Allergy   . Arthritis     knees  . History of radiation therapy 10/31/13-12/08/13    rectal 50.4Gy total  . Rectal cancer     Past Surgical History  Procedure Laterality Date  . Lumbar disc surgery  1984, 1987  . Wrist surgery Right 2006  . Shoulder surgery Right 1999  . Surgery scrotal / testicular Left 58 y/o    respositioning into scrotum  . Eus N/A 10/05/2013    Procedure: LOWER ENDOSCOPIC ULTRASOUND (EUS);  Surgeon: [name], MD;  Location: [name];  Service: Endoscopy;  Laterality: N/A;  . Flexible sigmoidoscopy N/A 10/05/2013    Procedure: FLEXIBLE SIGMOIDOSCOPY;  Surgeon: [name], MD;  Location: [facility];  Service: Endoscopy;  Laterality: N/A;    History   Social History  . Marital Status: Married    Spouse Name: N/A    Number of Children: N/A  . 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 Smoking status: Current Every Day Smoker -- 1.00 packs/day for 40 years    Types: Cigarettes  . Smokeless tobacco: Not on file  .  Alcohol Use: No  . Drug Use: No  . Sexual Activity: Not on file   Other Topics Concern  . Not on file   Social History Narrative  . No narrative on file    Family History  Problem Relation Age of Onset  . Cancer Maternal Uncle     Throat Cancer  . Cancer Paternal Uncle     Colon Cancer    Current Facility-Administered Medications  Medication Dose Route Frequency Provider Last Rate Last Dose  . bupivacaine 0.25 % ON-Q pump DUAL CATH 300 mL  300 mL Other Continuous Adin Hector, MD      . cefoTEtan (CEFOTAN) 2 g in  dextrose 5 % 50 mL IVPB  2 g Intravenous On Call to OR Adin Hector, MD      . clindamycin (CLEOCIN) 900 mg, gentamicin (GARAMYCIN) 240 mg in sodium chloride 0.9 % 1,000 mL for intraperitoneal lavage   Intraperitoneal To OR Adin Hector, MD       Facility-Administered Medications Ordered in Other Encounters  Medication Dose Route Frequency Provider Last Rate Last Dose  . lactated ringers infusion    Continuous PRN Glory Buff, CRNA      . lactated ringers infusion    Continuous PRN Glory Buff, CRNA         Allergies  Allergen Reactions  . Percocet [Oxycodone-Acetaminophen] Other (See Comments)    MAKES PATIENT ANXIOUS & HYPER    ROS: Constitutional:  No fevers, chills, sweats.  Weight stable Eyes:  No vision changes, No discharge HENT:  No sore throats, nasal drainage Lymph: No neck swelling, No bruising easily Pulmonary:  No cough, productive sputum CV: No orthopnea,  No exertional chest/neck/shoulder/arm pain. GI:  No personal nor family history of inflammatory bowel disease, irritable bowel syndrome, allergy such as Celiac Sprue, dietary/dairy problems, colitis, ulcers nor gastritis.  No recent sick contacts/gastroenteritis.  No travel outside the country.  No changes in diet. Renal: No UTIs, No hematuria Genital:  No drainage, bleeding, masses Musculoskeletal: No severe joint pain.  Good ROM major joints Skin:  No sores or lesions.  No rashes Heme/Lymph:  No easy bleeding.  No swollen lymph nodes Neuro: No focal weakness/numbness.  No seizures Psych: No suicidal ideation.  No hallucinations  BP 140/82  Pulse 108  Temp(Src) 97.7 F (36.5 C) (Oral)  Resp 20  SpO2 93%  Physical Exam: General: Pt awake/alert/oriented x4 in no major acute distress Eyes: PERRL, normal EOM. Sclera nonicteric Neuro: CN II-XII intact w/o focal sensory/motor deficits. Lymph: No head/neck/groin lymphadenopathy Psych:  No delerium/psychosis/paranoia HENT: Normocephalic,  Mucus membranes moist.  No thrush Neck: Supple, No tracheal deviation Chest: No pain.  Good respiratory excursion. CV:  Pulses intact.  Regular rhythm Abdomen: Soft, Nondistended.  Nontender.  No incarcerated hernias. Ext:  SCDs BLE.  No significant edema.  No cyanosis Skin: No petechiae / purpurea.  No major sores Musculoskeletal: No severe joint pain.  Good ROM major joints   Results:   Labs: Results for orders placed during the hospital encounter of 02/21/14 (from the past 48 hour(s))  TYPE AND SCREEN     Status: None   Collection Time    02/21/14  6:00 AM      Result Value Ref Range   ABO/RH(D) B POS     Antibody Screen NEG     Sample Expiration 02/24/2014    ABO/RH     Status: None   Collection Time    02/21/14  6:00 AM      Result Value Ref Range   ABO/RH(D) B POS      Imaging / Studies: No results found.  Medications / Allergies: per chart  Antibiotics: Anti-infectives   Start     Dose/Rate Route Frequency Ordered Stop   02/21/14 0630  clindamycin (CLEOCIN) 900 mg, gentamicin (GARAMYCIN) 240 mg in sodium chloride 0.9 % 1,000 mL for intraperitoneal lavage    Comments:  Pharmacy may adjust dosing strength, schedule, rate of infusion, etc as needed to optimize therapy    Intraperitoneal To Surgery 02/21/14 0535 02/22/14 0630   02/21/14 0535  cefoTEtan (CEFOTAN) 2 g in dextrose 5 % 50 mL IVPB     2 g 100 mL/hr over 30 Minutes Intravenous On call to O.R. 02/21/14 0535 02/22/14 0559      Assessment  Stephen Costa  58 y.o. male  Day of Surgery  Procedure(s): ROBOT ASSISTED LAPAROSCOPIC possible open low anterior resection diverting loop ileostomy  PROCTOSCOPY  Problem List:  Active Problems:   * No active hospital problems. *   Mid-rectal rectal cancer Status post completion of neoadjuvant chemoradiation therapy 10 weeks ago  Plan:  Sphincter sparing very feasible. Given his risk factors (male, morbidly obese, smoker) it will require the patient to have a  diverting loop ileostomy as the anastomosis is most likely going to be within 5 cm from the anal verge & leak risk will be higher. I did discuss the procedure with the patient his wife. Good candidate for minimally invasive approach. Robotic, possible laparoscopic approach:  The anatomy & physiology of the digestive tract was discussed. The pathophysiology was discussed. Natural history risks without surgery was discussed. I worked to give an overview of the disease and the frequent need to have multispecialty involvement. I feel the risks of no intervention will lead to serious problems that outweigh the operative risks; therefore, I recommended a partial proctocolectomy to remove the pathology. Robotic, Laparoscopic & open techniques were discussed. We will work to preserve anal & pelvic floor function without sacrificing cure. Plan repair of umbilical hernia around the same time.  Risks such as bleeding, infection, abscess, leak, reoperation, possible ostomy, hernia, heart attack, death, and other risks were discussed. I noted a good likelihood this will help address the problem. Goals of post-operative recovery were discussed as well. We will work to minimize complications. An educational handout on the pathology was given as well. Questions were answered.  The patient & wife express understanding & wishes to proceed with surgery. He is STILL very concerned about missing work. I cautioned him that this is going to be a long drawn out involved process and he will need to take time to take care of this.   -VTE prophylaxis- SCDs, etc -mobilize as tolerated to help recovery    Adin Hector, M.D., F.A.C.S. Gastrointestinal and Minimally Invasive Surgery [facility] [address], [place], [place] [address] [phone] Main / Paging   02/21/2014  Note: This dictation was prepared with Dragon/digital dictation along with [facility] technology. Any  transcriptional errors that result from this process are unintentional.

## 2014-02-21 NOTE — Discharge Instructions (Signed)
ABDOMINAL SURGERY: POST OP INSTRUCTIONS ° °1. DIET: Follow a light bland diet the first 24 hours after arrival home, such as soup, liquids, crackers, etc.  Be sure to include lots of fluids daily.  Avoid fast food or heavy meals as your are more likely to get nauseated.  Eat a low fat the next few days after surgery.   °2. Take your usually prescribed home medications unless otherwise directed. °3. PAIN CONTROL: °a. Pain is best controlled by a usual combination of three different methods TOGETHER: °i. Ice/Heat °ii. Over the counter pain medication °iii. Prescription pain medication °b. Most patients will experience some swelling and bruising around the incisions.  Ice packs or heating pads (30-60 minutes up to 6 times a day) will help. Use ice for the first few days to help decrease swelling and bruising, then switch to heat to help relax tight/sore spots and speed recovery.  Some people prefer to use ice alone, heat alone, alternating between ice & heat.  Experiment to what works for you.  Swelling and bruising can take several weeks to resolve.   °c. It is helpful to take an over-the-counter pain medication regularly for the first few weeks.  Choose one of the following that works best for you: °i. Naproxen (Aleve, etc)  Two 220mg tabs twice a day °ii. Ibuprofen (Advil, etc) Three 200mg tabs four times a day (every meal & bedtime) °iii. Acetaminophen (Tylenol, etc) 500-650mg four times a day (every meal & bedtime) °d. A  prescription for pain medication (such as oxycodone, hydrocodone, etc) should be given to you upon discharge.  Take your pain medication as prescribed.  °i. If you are having problems/concerns with the prescription medicine (does not control pain, nausea, vomiting, rash, itching, etc), please call us [phone] to see if we need to switch you to a different pain medicine that will work better for you and/or control your side effect better. °ii. If you need a refill on your pain medication,  please contact your pharmacy.  They will contact our office to request authorization. Prescriptions will not be filled after 5 pm or on week-ends. °4. Avoid getting constipated.  Between the surgery and the pain medications, it is common to experience some constipation.  Increasing fluid intake and taking a fiber supplement (such as Metamucil, Citrucel, FiberCon, MiraLax, etc) 1-2 times a day regularly will usually help prevent this problem from occurring.  A mild laxative (prune juice, Milk of Magnesia, MiraLax, etc) should be taken according to package directions if there are no bowel movements after 48 hours.   °5. Watch out for diarrhea.  If you have many loose bowel movements, simplify your diet to bland foods & liquids for a few days.  Stop any stool softeners and decrease your fiber supplement.  Switching to mild anti-diarrheal medications (Kayopectate, Pepto Bismol) can help.  If this worsens or does not improve, please call us. °6. Wash / shower every day.  You may shower over the incision / wound.  Avoid baths until the skin is fully healed.  Continue to shower over incision(s) after the dressing is off. °7. Remove your waterproof bandages 5 days after surgery.  You may leave the incision open to air.  You may replace a dressing/Band-Aid to cover the incision for comfort if you wish. °8. ACTIVITIES as tolerated:   °a. You may resume regular (light) daily activities beginning the next day--such as daily self-care, walking, climbing stairs--gradually increasing activities as tolerated.  If you can   walk 30 minutes without difficulty, it is safe to try more intense activity such as jogging, treadmill, bicycling, low-impact aerobics, swimming, etc. b. Save the most intensive and strenuous activity for last such as sit-ups, heavy lifting, contact sports, etc  Refrain from any heavy lifting or straining until you are off narcotics for pain control.   c. DO NOT PUSH THROUGH PAIN.  Let pain be your guide: If it  hurts to do something, don't do it.  Pain is your body warning you to avoid that activity for another week until the pain goes down. d. You may drive when you are no longer taking prescription pain medication, you can comfortably wear a seatbelt, and you can safely maneuver your car and apply brakes. e. Dennis Bast may have sexual intercourse when it is comfortable.  9. FOLLOW UP in our office a. Please call [facility] at [phone] to set up an appointment to see your surgeon in the office for a follow-up appointment approximately 1-2 weeks after your surgery. b. Make sure that you call for this appointment the day you arrive home to insure a convenient appointment time. 10. IF YOU HAVE DISABILITY OR FAMILY LEAVE FORMS, BRING THEM TO THE OFFICE FOR PROCESSING.  DO NOT GIVE THEM TO YOUR DOCTOR.   WHEN TO CALL us [phone]: 1. Poor pain control 2. Reactions / problems with new medications (rash/itching, nausea, etc)  3. Fever over 101.5 F (38.5 C) 4. Inability to urinate 5. Nausea and/or vomiting 6. Worsening swelling or bruising 7. Continued bleeding from incision. 8. Increased pain, redness, or drainage from the incision  The clinic staff is available to answer your questions during regular business hours (8:30am-5pm).  Please dont hesitate to call and ask to speak to one of our nurses for clinical concerns.   A surgeon from [facility] is always on call at the hospitals   If you have a medical emergency, go to the nearest emergency room or call 911.    [facility], [place], [place], [place], [place]  [address] ? MAIN: [phone] ? TOLL FREE: [phone] ? FAX [phone] [email or web address]  Ostomy Support Information  Yes, Theresia Majors heard that people get along just fine with only one of their eyes, or one of their lungs, or one of their kidneys. But you also know that you have only one intestine and only one bladder, and that  leaves you feeling awfully empty, both physically and emotionally: You think no other people go around without part of their intestine with the ends of their intestines sticking out through their abdominal walls.  Well, you are wrong! There are nearly three quarters of a million people in the [place] who have an ostomy; people who have had surgery to remove all or part of their colons or bladders. There is even a national association, the [place] [employer] [place] with over 350 local affiliated support groups that are organized by volunteers who provide peer support and counseling. Juan Quam has a toll free telephone num-ber, ([phone] [phone] and an educational,  interactive website, www.ostomy.org   An ostomy is an opening in the belly (abdominal wall) made by surgery. Ostomates are people who have had this procedure. The opening (stoma) allows the kidney or bowel to discharge waste. An external pouch covers the stoma to collect waste. Pouches are are a simple bag and are odor free. Different companies have disposable or reusable pouches to fit one's lifestyle. An ostomy can either be  temporary or permanent.  THERE ARE THREE MAIN TYPES OF OSTOMIES  Colostomy. A colostomy is a surgically created opening in the large intestine (colon).  Ileostomy. An ileostomy is a surgically created opening in the small intestine.  Urostomy. A urostomy is a surgically created opening to divert urine away from the bladder. FREQUENTLY ASKED QUESTIONS   Why havent you met any of these folks who have an ostomy?  Well, maybe you have! You just did not recognize them because an ostomy doesn't show. It can be kept secret if you wish. Why, maybe some of your best friends, office associates or neighbors have an ostomy ... you never can tell.   People facing ostomy surgery have many quality-of-life questions like:  Will you bulge? Smell? Make noises? Will you feel waste leaving your body? Will you be a captive of the toilet?  Will you starve? Be a social outcast? Get/stay married? Have babies? Easily bathe, go swimming, bend over?  OK, lets look at what you can expect:  Will you bulge?  Remember, without part of the intestine or bladder, and its contents, you should have a flatter tummy than before. You can expect to wear, with little exception, what you wore before surgery ... and this in-cludes tight clothing and bathing suits.  Will you smell?  Today, thanks to modern odor proof pouching systems, you can walk into an ostomy support group meeting and not smell anything that is foul or offensive. And, for those with an ileostomy or colostomy who are concerned about odor when emptying their pouch, there are in-pouch deodorants that can be used to eliminate any waste odors that may exist.  Will you make noises?  Everyone produces gas, especially if they are an air-swallower. But intestinal sounds that occur from time to time are no differ-ent than a gurgling tummy, and quite often your clothing will muffle any sounds.   Will you feel the waste discharges?  For those with a colostomy or ileostomy there might be a slight pressure when waste leaves your body, but understand that the intestines have no nerve endings, so there will be no unpleasant sensations. Those with a urostomy will probably be unaware of any kidney drainage.  Will you be a captive of the toilet?  Immediately post-op you will spend more time in the bathroom than you will after your body recovers from surgery. Every person is different, but on average those with an ileostomy or urostomy may empty their pouches 4 to 6 times a day; a little  less if you have a colostomy. The average wear time between pouch system changes is 3 to 5 days and the changing process should take less than 30 minutes.  Will I need to be on a special diet? Most people return to their normal diet when they have recovered from surgery. Be sure to chew your food well, eat a well-balanced  diet and drink plenty of fluids. If you experience problems with a certain food, wait a couple of weeks and try it again. Will there be odor and noises? Pouching systems are designed to be odor-proof or odor-resistant. There are deodorants that can be used in the pouch. Medications are also available to help reduce odor. Limit gas-producing foods and carbonated beverages. You will experience less gas and fewer noises as you heal from surgery. How much time will it take to care for my ostomy? At first, you may spend a lot of time learning about your ostomy and how to take  care of it. As you become more comfortable and skilled at changing the pouching system, it will take very little time to care for it.  Will I be able to return to work? People with ostomies can perform most jobs. As soon as you have healed from surgery, you should be able to return to work. Heavy lifting (more than 10 pounds) may be discouraged.  What about intimacy? Sexual relationships and intimacy are important and fulfilling aspects of your life. They should continue after ostomy surgery. Intimacy-related concerns should be discussed openly between you and your partner.  Can I wear regular clothing? You do not need to wear special clothing. Ostomy pouches are fairly flat and barely noticeable. Elastic undergarments will not hurt the stoma or prevent the ostomy from functioning.  Can I participate in sports? An ostomy should not limit your involvement in sports. Many people with ostomies are runners, skiers, swimmers or participate in other active lifestyles. Talk with your caregiver first before doing heavy physical activity.  Will you starve?  Not if you follow doctors orders at each stage of your post-op adjustment. There is no such thing as an ostomy diet. Some people with an ostomy will be able to eat and tolerate anything; others may find diffi-culty with some foods. Each person is an individual and must determine, by  trial, what is best for them. A good practice for all is to drink plenty of water.  Will you be a social outcast?  Have you met anyone who has an ostomy and is a social outcast? Why should you be the first? Only your attitude and self image will effect how you are treated. No confi-dent person is an Occupational psychologist.   PROFESSIONAL HELP  Resources are available if you need help or have questions about your ostomy.    Specially trained nurses called Wound, Ostomy Continence Nurses (WOCN) are available for consultation in most major medical centers.   Consider getting an ostomy consult with Cena Benton at [facility] to help troubleshoot stoma pouch fittings and other issues with your ostomy: [phone]   The [employer] Ostomy Association (UOA) is a group made up of many local chapters throughout the [place]. These local groups hold meetings and provide support to prospective and existing ostomates. They sponsor educational events and have qualified visitors to make personal or telephone visits. Contact the UOA for the chapter nearest you and for other educational publications.  More detailed information can be found in Colostomy Guide, a publication of the [employer] (UOA). Contact UOA at 1-279-139-2183 or visit their web site at https://arellano.com/. The website contains links to other sites, suppliers and resources. Document Released: 09/18/2003 Document Revised: 12/08/2011 Document Reviewed: 01/17/2009 [facility] Patient Information 2013 [place].  Colorectal Cancer Colorectal cancer is an abnormal growth of tissue (tumor) in the colon or rectum that is cancerous (malignant). Unlike noncancerous (benign) tumors, malignant tumors can spread to other parts of your body. The colon is the large bowel or large intestine. The rectum is the last several inches of the colon.  RISK FACTORS The exact cause of colorectal cancer is unknown. However, the following factors may increase  your chances of getting colorectal cancer:   Age older than 69 years.   Abnormal growths (polyps) on the inner wall of the colon or rectum.   Diabetes.   African American race.   Family history of hereditary nonpolyposis colorectal cancer. This condition is caused by changes in the genes that are responsible  for repairing mismatched DNA.   Personal history of cancer. A person who has already had colorectal cancer may develop it a second time. Also, women with a history of ovarian, uterine, or breast cancer are at a somewhat higher risk of developing colorectal cancer.  Certain hereditary conditions.  Eating a diet that is high in fat (especially animal fat) and low in fiber, fruits, and vegetables.  Sedentary lifestyle.  Inflammatory bowel disease, including ulcerative colitis and Crohn disease.   Smoking.   Excessive alcohol use.  SYMPTOMS Early colorectal cancer often does not cause symptoms. As the cancer grows, symptoms may include:   Changes in bowel habits.  Diarrhea.   Constipation.   Feeling like the bowel does not empty completely after a bowel movement.   Blood in the stool.   Stools that are narrower than usual.   Abdominal discomfort, pain, bloating, fullness, or cramps.  Frequent gas pain.   Unexplained weight loss.   Constant tiredness.   Nausea and vomiting.  DIAGNOSIS  Your health care provider will ask about your medical history. He or she may also perform a number of procedures, such as:   A physical exam.  A digital rectal exam.  A fecal occult blood test.  A barium enema.  Blood tests.   X-rays.   Imaging tests, such as CT scans or MRIs.   Taking a tissue sample (biopsy) from your colon or rectum to look for cancer cells.   A sigmoidoscopy to view the inside of the last part of your colon.   A colonoscopy to view the inside of your entire colon.   An endorectal ultrasound to see how deep a rectal  tumor has grown and whether the cancer has spread to lymph nodes or other nearby tissues.  Your cancer will be staged to determine its severity and extent. Staging is a careful attempt to find out the size of the tumor, whether the cancer has spread, and if so, to what parts of the body. You may need to have more tests to determine the stage of your cancer. The test results will help determine what treatment plan is best for you.   Stage 0 The cancer is found only in the innermost lining of the colon or rectum.   Stage I The cancer has grown into the inner wall of the colon or rectum. The cancer has not yet reached the outer wall of the colon.   Stage II The cancer extends more deeply into or through the wall of the colon or rectum. It may have invaded nearby tissue, but cancer cells have not spread to the lymph nodes.   Stage III The cancer has spread to nearby lymph nodes but not to other parts of the body.   Stage IV The cancer has spread to other parts of the body, such as the liver or lungs.  Your health care provider may tell you the detailed stage of your cancer, which includes both a number and a letter.  TREATMENT  Depending on the type and stage, colorectal cancer may be treated with surgery, radiation therapy, chemotherapy, targeted therapy, or radiofrequency ablation. Some people have a combination of these therapies. Surgery may be done to remove the polyps from your colon. In early stages, your health care provider may be able to do this during a colonoscopy. In later stages, surgery may be done to remove part of your colon.  HOME CARE INSTRUCTIONS   Only take over-the-counter or prescription medicines for  pain, discomfort, or fever as directed by your health care provider.   Maintain a healthy diet.   Consider joining a support group. This may help you learn to cope with the stress of having colorectal cancer.   Seek advice to help you manage treatment of side effects.    Keep all follow-up appointments as directed by your health care provider.   Inform your cancer specialist if you are admitted to the hospital.  SEEK MEDICAL CARE IF:  Your diarrhea or constipation does not go away.   Your bowel habits change.  You have increased abdominal pain.   You notice new fatigue or weakness.  You lose weight. Document Released: 09/15/2005 Document Revised: 05/18/2013 Document Reviewed: 03/10/2013 [facility] Patient Information 2014 [place].

## 2014-02-21 NOTE — Transfer of Care (Signed)
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

## 2014-02-21 NOTE — Anesthesia Procedure Notes (Signed)
Procedure Name: Intubation Date/Time: 02/21/2014 7:33 AM Performed by: Ofilia Neas Pre-anesthesia Checklist: Patient identified, Timeout performed, Emergency Drugs available, Suction available and Patient being monitored Patient Re-evaluated:Patient Re-evaluated prior to inductionOxygen Delivery Method: Circle system utilized Preoxygenation: Pre-oxygenation with 100% oxygen Intubation Type: IV induction and Cricoid Pressure applied Ventilation: Mask ventilation with difficulty Laryngoscope Size: Mac and 4 Grade View: Grade III Tube type: Glide rite Tube size: 7.5 mm Number of attempts: 1 Airway Equipment and Method: Video-laryngoscopy (elective glidescope due to anticipation of difficult intubation due to body habitus) Placement Confirmation: ETT inserted through vocal cords under direct vision,  positive ETCO2 and breath sounds checked- equal and bilateral Secured at: 21 cm Tube secured with: Tape Dental Injury: Teeth and Oropharynx as per pre-operative assessment  Difficulty Due To: Difficulty was anticipated, Difficult Airway- due to large tongue, Difficult Airway- due to reduced neck mobility, Difficult Airway- due to dentition, Difficult Airway- due to limited oral opening and Difficult Airway- due to anterior larynx Future Recommendations: Recommend- induction with short-acting agent, and alternative techniques readily available

## 2014-02-21 NOTE — Op Note (Addendum)
02/21/2014  2:44 PM  PATIENT:  Stephen Costa  58 y.o. male  Patient Care Team: Lynne Logan, MD as PCP - General (Family Medicine) [name], MD as Consulting Physician (Gastroenterology) Adin Hector, MD as Consulting Physician (General Surgery) Ladell Pier, MD as Consulting Physician (Oncology) Marye Round, MD as Consulting Physician (Radiation Oncology) Carola Frost, RN as Registered Nurse  PRE-OPERATIVE DIAGNOSIS:  MID RECTUM CANCER   POST-OPERATIVE DIAGNOSIS:  RECTAL CANCER   PROCEDURE:  Procedure(s): ROBOTIC SPLENIC FLEXURE MOBILIZATION LOW ANTERIOR RESECTION DIVERTING LOOP ILEOSTOMY RIGID PROCTOSCOPY  SURGEON:  Surgeon(s): Adin Hector, MD Leighton Ruff, MD Asst  ANESTHESIA:   local and general  EBL:  Total I/O In: 4000 [I.V.:4000] Out: 2000 [Urine:475; Other:400; Blood:1125]  Delay start of Pharmacological VTE agent (>24hrs) due to surgical blood loss or risk of bleeding:  no  DRAINS: (19Fr) Blake drain(s) in the pelvis   SPECIMEN:  Source of Specimen:  1.  Rectosigmoid  2. Extra sigmoid colon proximal margin  3.  Anastomotic rings (blue stitch proximal ring.  Stapled ring new distal margin)  DISPOSITION OF SPECIMEN:  PATHOLOGY  COUNTS:  YES  PLAN OF CARE: Admit to inpatient   PATIENT DISPOSITION:  PACU - hemodynamically stable.  INDICATION:    Pleasant smoking morbidly obese male with rectal cancer at mid/distal junction.  Underwent neoadjuvant radiation chemotherapy.  He is almost 10 weeks status post completion.  I recommended segmental resection:  The anatomy & physiology of the digestive tract was discussed.  The pathophysiology was discussed.  Natural history risks without surgery was discussed.   I worked to give an overview of the disease and the frequent need to have multispecialty involvement.  I feel the risks of no intervention will lead to serious problems that outweigh the operative risks; therefore, I recommended a  partial colectomy to remove the pathology.  Laparoscopic & open techniques were discussed.   Risks such as bleeding, infection, abscess, leak, reoperation, possible ostomy, hernia, heart attack, death, and other risks were discussed.  I noted a good likelihood this will help address the problem.   Goals of post-operative recovery were discussed as well.  We will work to minimize complications.  Educational materials on the pathology had been given in the office.  Questions were answered.    The patient expressed understanding & wished to proceed with surgery.  OR FINDINGS:   Patient had persistent bulky ulcerated scarred mass on the left lateral sidewall 8 cm from the anal verge.  No obvious metastatic disease on visceral parietal peritoneum or liver.  The anastomosis rests 4-5 cm from the anal verge by rigid proctoscopy.  DESCRIPTION:   Informed consent was confirmed.  The patient underwent general anaesthesia without difficulty.  The patient was positioned appropriately.  VTE prevention in place.  To perform rigid proctoscopy to confirm the location of the mass and marked it with [place] ink.  The patient's abdomen was clipped, prepped, & draped in a sterile fashion.  Surgical timeout confirmed our plan.  The patient was positioned in reverse Trendelenburg.  Abdominal entry was gained using optical entry technique in the right upper abdomen.  Entry was clean.  I induced carbon dioxide insufflation.  Camera inspection revealed no injury.  Extra ports were carefully placed under direct laparoscopic visualization.  I reflected the greater omentum and the upper abdomen the small bowel in the upper abdomen.  The patient was carefully positioned.  The patient's colon was very dilated.  I placed a Foley balloon in the rectum help decompress the colon better.  Even with the colon better decompressed it was still a challenge to get adequate visualization.  In order to get some visualization, we had to  position in steep Trendelenburg position and right-side-down.  Unfortunately he was desaturating, a challenge to keep his saturations up.  Some wheezing.  Anesthesia checked the ETT placement and positioning.  They gave albuterol nebulization as well x 2.  We decided to start with splenic flexure mobilization robotically.  We positioned the patient's left side up and reverse Trendelenburg.  His saturations or more tolerable in reverse Trendelenburg position.  We decreased abdominal pressure to 12.  The Intuitive daVinci robot was carefully docked with camera & instruments carefully placed.  I elevated the greater omentum and freed it from the attachments to the middle transverse colon towards the splenic flexure.  I could mobilize some of the proximal descending colon and a lateral to medial fashion and come around splenic flexure gradually.  We attempted and Trendelenburg positioning again for the pelvic dissection.  Unfortunately he resumed desaturating.  We therefore decided to complete the case in an open fashion.  We entered the abdomen through a low midline incision, carrying the incision just above the umbilicus to go through the umbilical hernia.  I placed a Bookwalter retractor and carefully reflected the small bowel away as best possible.  He had massive central obesity so there was no a lot of working space.  He had a redundant sigmoid colon.  Was able to mobilize it and a lateral to medial fashion.  We came more proximally up the left gutter and connected with the splenic flexure mobilization.  We further freed the distal half of the colon off the retroperitoneum including the kidney.  We took care to identify the left ureter which was in a large fat pedicle the retroperitoneum.  We kept that posterior.  I scored the mesentery on the medial aspect of the sigmoid colon down towards the peritoneal reflection of the rectum.  Visualization was a challenge given his very thick body wall and significant  fat.  We transected at the proximal redundant sigmoid colon and took the mesentery more proximally down to the inferior mesenteric artery pedicle.  He saw identified the right ureter and stayed away from that during ligation. We ligated that and the mid inferior mesenteric vein using clips and ties and bipolar EnSeal system.  We were able to free the retrosigmoid colon off the nervi ergentes over the sacral promontory.  We entered the presacral plane and mobilized the posterior mesorectum.  We came around more anteriorly and isolated and ligated the pedicles of the proximal & mid rectum using the bipolar EnSeal system.  We gradually able to eviscerate some of the rectum out of the pelvis.  He had a very low peritoneal reflection.  We did come around anteriorly.  It was a challenge with his very narrow pelvis and significant fat.  Eventually we came around and working quadrant by quadrant gradually freed the rectum from its attachments to the pelvis side walls and posteriorly.  We went as far as to the tip of the coccyx and got distal to these the rectum posteriorly.  I came around anteriorly carefully.  We freed off the bladder trigone and prostate and seminal vesicles to get more distally.  I occasionally did digital rectal exam and intra-abdominal exam to ensure that we had adequate distal margins.    I transected  bowel at the distal rectum beyond the obvious mid/distal left lateral rectal mass using a contour stapler. I was able to eviscerate the rectosigmoid out the wound.  I opened up the specimen.  Had a 1 cm distal margin.  Given his very narrow thickened pelvis that was very horizontal and deep, I did not feel we could staple off a more distal rectal stump.   I changed gloves.  We found an area of his descending colon that would reach down.  We did transect some more proximal sigmoid colon that was somewhat ischemic until we got healthy bleeding mucosa.  Transected that off and sent as well.  We sized  the colon orifice.  I chose a 33 EEA anvil stapler system. I placed the anvil to the open end of the proximal remaining colon and closed around it using a 0 Prolene pursestring.  We did copious irrigation with crystalloid solution.  Hemostasis was good.  The distal end of the remaining colon easily reached down to the rectal stump.  Dr Marcello Moores scrubbed down and did gentle anal dilation and advanced the EEA stapler up the rectal stump. The spike was brought out at the provimal end of the rectal stump under direct visualization.  I attached the anvil of the proximal colon the spike of the stapler. Anvil was tightened down and held clamped for 60 seconds. The EEA stapler was fired and held clamped for 30 seconds. The stapler was released & removed. We noted 2 excellent anastomotic rings. Blue stitch is in the proximal ring.  The distal ring with staples and it was a centimeter long, giving [place] a new Docia Klar distal margin of 2 cm.  Dr Marcello Moores did rigid proctoscopy noted the anastomosis was at 5 cm from the anal verge consistent with the distal rectum.  We did a final irrigation of antibiotic solution (900 mg clindamycin/240 mg gentamicin in a liter of crystalloid) & held that for the pelvic air leak test .  The rectum was insufflated the rectum while clamping the colon proximal to that anastomosis.  There was a negative air leak test. There was no tension of mesentery or bowel at the anastomosis.   Tissues looked viable.    We changed gown and gloves.  The patient was re-draped.  Sterile unused instruments were used from this point out per colon SSI prevention protocol.   Inspected small bowel and found no evidence of injury or abnormality.  Because he was morbidly obese and a smoker with a distal anastomosis, I decided to protect the anastomosis with a diverting loop ileostomy.  I chose an area in the distal ileum about a foot proximal to the ileocecal valve that stretched up well.  I created an ileostomy wound defect  in the right infraumbilical paramedian region that had been marked preoperatively.  I opened the anterior rectus fascia transversely.  I split through the muscle and entered through the posterior rectus fascia easily.  We brought the loop of intestine up.  We used a red rubber catheter through the ileal mesentery edge to help sling the ileum up the wound.  I placed On-Q catheter and sheaths into the preperitoneal space under direct palpation.  I placed a drain in the left lower quadrant port site down into the pelvis.  The tip breasts very distally on top of the coccyx.  Secured that with a 2-0 Prolene drain stitch.  I closed the 8 mm port sites that remained using a 0 Vicryl stitch and Endostith passer.  I closed the 67mm port sites using Monocryl stitch and sterile dressing.  I excised the umbilicus since it had a hernia there and would not affect the placement of the ileostomy pouch.  I closed the midline fascial wound using #1 looped running PDS to good result.  I closed the skin with some interrupted Monocryl stitches. I placed antibiotic-soaked wicks into the closure at the corners & centrally x4 between those areas. I placed a sterile dressing.  OnQ catheters placed & sheaths peeled away. I matured the ileostomy using 3-0 Vicryl interrupted pops around the edges.  I trimmed and the red rubber catheter upon itself to form a little ring bolster to help keep the loop ileostomy up.  He had rather short thickened mesentery so the ileostomy laid rather flat but was viable  Patient is extubated & in the recovery room. I discussed postop care with the patient in detail the office & in the holding area. Instructions are written. I updated the patient's status to the family.  Recommendations were made.  Questions were answered.  The family expressed understanding & appreciation.

## 2014-02-21 NOTE — Progress Notes (Signed)
Patient has reddened area around left nipple. No drainage present. Stated he saw Dr. Alphonzo Cruise where he was prescribed an antibiotic. Patient called on call oncologist to notify and verify it would not interfere with surgery. He did not call surgeon, but stated oncologist said it should be fine. Patient also denied bowel prep prior to surgery and stated he was not instructed to do one.

## 2014-02-21 NOTE — Anesthesia Postprocedure Evaluation (Signed)
Anesthesia Post Note  Patient: Stephen Costa  Procedure(s) Performed: Procedure(s) (LRB): ROBOTIC SPLENIC FLEXURE MOBILIZATION WITH LOW ANTERIOR RESECTION AND DIVERTING LOOP ILEOSTOMY (N/A) RIGID PROCTOSCOPY (N/A)  Anesthesia type: General  Patient location: PACU  Post pain: Pain level controlled  Post assessment: Post-op Vital signs reviewed  Last Vitals: BP 155/94  Pulse 109  Temp(Src) 36.8 C (Oral)  Resp 17  Ht 5\' 7"  (1.702 m)  Wt 256 lb (116.121 kg)  BMI 40.09 kg/m2  SpO2 91%  Post vital signs: Reviewed  Level of consciousness: sedated  Complications: No apparent anesthesia complications

## 2014-02-22 ENCOUNTER — Encounter (HOSPITAL_COMMUNITY): Payer: Self-pay | Admitting: Surgery

## 2014-02-22 DIAGNOSIS — G5690 Unspecified mononeuropathy of unspecified upper limb: Secondary | ICD-10-CM

## 2014-02-22 DIAGNOSIS — N61 Mastitis without abscess: Secondary | ICD-10-CM

## 2014-02-22 LAB — BASIC METABOLIC PANEL
BUN: 12 mg/dL (ref 6–23)
CALCIUM: 8.2 mg/dL — AB (ref 8.4–10.5)
CHLORIDE: 100 meq/L (ref 96–112)
CO2: 26 mEq/L (ref 19–32)
Creatinine, Ser: 0.73 mg/dL (ref 0.50–1.35)
GFR calc Af Amer: >90 mL/min (ref >90)
GFR calc non Af Amer: >90 mL/min (ref >90)
GLUCOSE: 165 mg/dL — AB (ref 70–99)
Potassium: 4.3 mEq/L (ref 3.7–5.3)
SODIUM: 137 meq/L (ref 137–147)

## 2014-02-22 LAB — CBC
HEMATOCRIT: 35.7 % — AB (ref 39.0–52.0)
HEMOGLOBIN: 12.7 g/dL — AB (ref 13.0–17.0)
MCH: 33.2 pg (ref 26.0–34.0)
MCHC: 35.6 g/dL (ref 30.0–36.0)
MCV: 93.5 fL (ref 78.0–100.0)
PLATELETS: 294 10*3/uL (ref 150–400)
RBC: 3.82 MIL/uL — AB (ref 4.22–5.81)
RDW: 12.9 % (ref 11.5–15.5)
WBC: 15.1 10*3/uL — ABNORMAL HIGH (ref 4.0–10.5)

## 2014-02-22 LAB — MAGNESIUM: Magnesium: 1.6 mg/dL (ref 1.5–2.5)

## 2014-02-22 MED ORDER — AMOXICILLIN-POT CLAVULANATE 875-125 MG PO TABS
1.0000 | ORAL_TABLET | Freq: Two times a day (BID) | ORAL | Status: DC
  Administered 2014-02-22 – 2014-02-26 (×8): 1 via ORAL
  Filled 2014-02-22 (×9): qty 1

## 2014-02-22 MED ORDER — METOPROLOL TARTRATE 12.5 MG HALF TABLET
12.5000 mg | ORAL_TABLET | Freq: Two times a day (BID) | ORAL | Status: DC
  Administered 2014-02-22 (×2): 12.5 mg via ORAL
  Filled 2014-02-22 (×5): qty 1

## 2014-02-22 NOTE — Progress Notes (Signed)
CARE MANAGEMENT NOTE 02/22/2014  Patient:  [facility]   Account Number:  [id number]  Date Initiated:  02/22/2014  Documentation initiated by:  DAVIS,RHONDA  Subjective/Objective Assessment:   rectal ca with protectomy and new loop colostomy.  post op hypotensive and requiring invasive monitoring.     Action/Plan:   lives at home with spouse. will follow for hhc needs and teaching needs   Anticipated DC Date:  02/25/2014   Anticipated DC Plan:  [place]  In-house referral  NA      DC Planning Services  CM consult      [facility] Choice  NA   Choice offered to / List presented to:  NA   DME arranged  NA        HH arranged  NA      Status of service:  In process, will continue to follow Medicare Important Message given?  NA - LOS <3 / Initial given by admissions (If response is "NO", the following Medicare IM given date fields will be blank) Date Medicare IM given:   Date Additional Medicare IM given:    Discharge Disposition:    Per UR Regulation:  Reviewed for med. necessity/level of care/duration of stay  If discussed at [place] of Stay Meetings, dates discussed:    Comments:  [id number]/Rhonda Eldridge Dace, BSN, [place] [phone] Chart Reviewed for discharge and hospital needs. Discharge needs at time of review: None present will follow for needs. Review of patient progress due on [id number].

## 2014-02-22 NOTE — Progress Notes (Signed)
Placed patient on Cpap of 10 with 6lpm 02 bleed in.  Patient is wearing medium full face mask and Saturating 93%.  HR 112, RR 20, BP 145/75.

## 2014-02-22 NOTE — Progress Notes (Addendum)
[place], MD, [address] [place], [place] [address] Phone: [phone] FAX: [phone]    Stephen Costa [id number] [birth year]  CARE TEAM:  PCP: Lynne Logan, MD  Outpatient Care Team: Patient Care Team: Lynne Logan, MD as PCP - General (Family Medicine) [name], MD as Consulting Physician (Gastroenterology) Adin Hector, MD as Consulting Physician (General Surgery) Ladell Pier, MD as Consulting Physician (Oncology) Marye Round, MD as Consulting Physician (Radiation Oncology) Carola Frost, RN as Registered Nurse  Inpatient Treatment Team: Treatment Team: Attending Provider: Adin Hector, MD; Registered Nurse: Richardson Landry, RN; Registered Nurse: Judie Grieve, RN   Subjective:  Pain controlled Sitting in chair Claims right hand numb w weaked grasp (h/o this in past w wrist fx) - getting a little better  Objective:  Vital signs:  Filed Vitals:   02/22/14 0300 02/22/14 0400 02/22/14 0500 02/22/14 0600  BP: 160/74 161/72 152/74 121/88  Pulse: 114 112 112 119  Temp:  98.1 F (36.7 C)    TempSrc:  Oral    Resp: 19 14 16 12   Height:      Weight:  259 lb 7.7 oz (117.7 kg)    SpO2: 92% 91% 92% 91%    Last BM Date: 02/20/14  Intake/Output   Yesterday:  05/26 0701 - 05/27 0700 In: 5807.5 [P.O.:150; I.V.:4907.5; IV Piggyback:50] Out: 6203 [Urine:1810; Drains:290; Blood:1125] This shift:     Bowel function:  Flatus: n  BM: n  Drain: sanguinous.  Mild leaking around drain  Physical Exam:  General: Pt awake/alert/oriented x4 in no acute distress Eyes: PERRL, normal EOM.  Sclera clear.  No icterus Neuro: CN II-XII intact.  Mild weakened R hand grip & mild/mod paraesthesias R.L hand.  No other focal sensory/motor deficits. Lymph: No head/neck/groin lymphadenopathy Psych:  No delerium/psychosis/paranoia HENT: Normocephalic, Mucus membranes moist.  No  thrush Neck: Supple, No tracheal deviation Chest: No chest wall pain w good excursion.  Mild erythema/swelling left nipple - improved from preop CV:  Pulses intact.  Regular rhythm.  HR 11-120s in chair MS: Normal AROM mjr joints.  No obvious deformity Abdomen: Soft.  Nondistended.  Ostomy R abdomen mildly dusky.  Mildly tender at incisions only.  No evidence of peritonitis.  No incarcerated hernias. Ext:  SCDs BLE.  No mjr edema.  No cyanosis Skin: No petechiae / purpura   Problem List:   Principal Problem:   Rectal cancer - Anterior 8cm from anal verge Active Problems:   Umbilical hernia - 1cm   Obesity (BMI 30-39.9)   Tobacco abuse   Assessment  Stephen Costa  58 y.o. male  1 Day Post-Op  Procedure(s): ROBOTIC SPLENIC FLEXURE MOBILIZATION WITH LOW ANTERIOR RESECTION AND DIVERTING LOOP ILEOSTOMY RIGID PROCTOSCOPY  OK  Plan:  -improve pain control w inc HR & BP -keep in stepdown unit until HR improves -adv diet slowly per protocol -PT/OT evals for hand fx - should recover -ostomy care/ training -f/u path -CPAP for OSA -Augmentin for left breast cellultis - 8 more days -VTE prophylaxis- SCDs, etc -mobilize as tolerated to help recovery  Adin Hector, M.D., F.A.C.S. Gastrointestinal and Minimally Invasive Surgery [facility] [address], [place], [place] [address] [phone] Main / Paging   02/22/2014   Results:   Labs: Results for orders placed during the hospital encounter of 02/21/14 (from the past 48 hour(s))  TYPE  AND SCREEN     Status: None   Collection Time    02/21/14  6:00 AM      Result Value Ref Range   ABO/RH(D) B POS     Antibody Screen NEG     Sample Expiration 02/24/2014    ABO/RH     Status: None   Collection Time    02/21/14  6:00 AM      Result Value Ref Range   ABO/RH(D) B POS    POCT I-STAT 7, (LYTES, BLD GAS, ICA,H+H)     Status: Abnormal   Collection Time    02/21/14 10:17 AM      Result  Value Ref Range   pH, Arterial 7.374  7.350 - 7.450   pCO2 arterial 29.5 (*) 35.0 - 45.0 mmHg   pO2, Arterial 79.0 (*) 80.0 - 100.0 mmHg   Bicarbonate 17.2 (*) 20.0 - 24.0 mEq/L   TCO2 18  0 - 100 mmol/L   O2 Saturation 95.0     Acid-base deficit 7.0 (*) 0.0 - 2.0 mmol/L   Sodium 148 (*) 137 - 147 mEq/L   Potassium 2.8 (*) 3.7 - 5.3 mEq/L   Calcium, Ion 1.02 (*) 1.12 - 1.23 mmol/L   HCT 31.0 (*) 39.0 - 52.0 %   Hemoglobin 10.5 (*) 13.0 - 17.0 g/dL   Sample type ARTERIAL    BLOOD GAS, ARTERIAL     Status: Abnormal   Collection Time    02/21/14 11:06 AM      Result Value Ref Range   FIO2 1.00     Delivery systems VENTILATOR     Mode PRESSURE MODE (SURGERY)     VT 800     Rate 13     Peep/cpap 7.0     pH, Arterial 7.394  7.350 - 7.450   pCO2 arterial 40.1  35.0 - 45.0 mmHg   pO2, Arterial 104.0 (*) 80.0 - 100.0 mmHg   Bicarbonate 23.9  20.0 - 24.0 mEq/L   TCO2 21.1  0 - 100 mmol/L   Acid-base deficit 0.3  0.0 - 2.0 mmol/L   O2 Saturation 97.4     Patient temperature 98.6     Collection site A-LINE     Drawn by [phone]     Sample type ARTERIAL DRAW    BASIC METABOLIC PANEL     Status: Abnormal   Collection Time    02/22/14  3:15 AM      Result Value Ref Range   Sodium 137  137 - 147 mEq/L   Comment: DELTA CHECK NOTED   Potassium 4.3  3.7 - 5.3 mEq/L   Comment: NO VISIBLE HEMOLYSIS     DELTA CHECK NOTED   Chloride 100  96 - 112 mEq/L   CO2 26  19 - 32 mEq/L   Glucose, Bld 165 (*) 70 - 99 mg/dL   BUN 12  6 - 23 mg/dL   Creatinine, Ser 0.73  0.50 - 1.35 mg/dL   Calcium 8.2 (*) 8.4 - 10.5 mg/dL   GFR calc non Af Amer >90  >90 mL/min   GFR calc Af Amer >90  >90 mL/min   Comment: (NOTE)     The eGFR has been calculated using the CKD EPI equation.     This calculation has not been validated in all clinical situations.     eGFR's persistently <90 mL/min signify possible Chronic Kidney     Disease.  CBC     Status: Abnormal   Collection  Time    02/22/14  3:15 AM       Result Value Ref Range   WBC 15.1 (*) 4.0 - 10.5 K/uL   RBC 3.82 (*) 4.22 - 5.81 MIL/uL   Hemoglobin 12.7 (*) 13.0 - 17.0 g/dL   Comment: DELTA CHECK NOTED     REPEATED TO VERIFY   HCT 35.7 (*) 39.0 - 52.0 %   MCV 93.5  78.0 - 100.0 fL   MCH 33.2  26.0 - 34.0 pg   MCHC 35.6  30.0 - 36.0 g/dL   RDW 12.9  11.5 - 15.5 %   Platelets 294  150 - 400 K/uL  MAGNESIUM     Status: None   Collection Time    02/22/14  3:15 AM      Result Value Ref Range   Magnesium 1.6  1.5 - 2.5 mg/dL    Imaging / Studies: Dg Chest Portable 1 View  02/21/2014   CLINICAL DATA:  Hypoxia.  EXAM: PORTABLE CHEST - 1 VIEW  COMPARISON:  Chest CT 09/30/2013.  FINDINGS: Endotracheal tube and NG tube noted in good anatomic position. Right upper lobe atelectatic changes noted. No pleural effusion or pneumothorax. Lucencies are noted in the right humeral proximal metaphysis. Right humerus series suggested for further evaluation. No acute bony abnormality .  IMPRESSION: 1. Right upper lobe atelectatic changes. 2. Endotracheal tube and NG tube in good anatomic position. 3. Lucencies within the proximal metaphysis of the right humerus, right humerus series suggested for further evaluation.   Electronically Signed   By: Marcello Moores  Register   On: 02/21/2014 14:54    Medications / Allergies: per chart  Antibiotics: Anti-infectives   Start     Dose/Rate Route Frequency Ordered Stop   02/21/14 2359  cefoTEtan (CEFOTAN) 2 g in dextrose 5 % 50 mL IVPB     2 g 100 mL/hr over 30 Minutes Intravenous Every 12 hours 02/21/14 1721 02/22/14 0027   02/21/14 0630  clindamycin (CLEOCIN) 900 mg, gentamicin (GARAMYCIN) 240 mg in sodium chloride 0.9 % 1,000 mL for intraperitoneal lavage  Status:  Discontinued    Comments:  Pharmacy may adjust dosing strength, schedule, rate of infusion, etc as needed to optimize therapy    Intraperitoneal To Surgery 02/21/14 0535 02/21/14 1706   02/21/14 0535  cefoTEtan (CEFOTAN) 2 g in dextrose 5 % 50 mL IVPB      2 g 100 mL/hr over 30 Minutes Intravenous On call to O.R. 02/21/14 0535 02/21/14 1245       Note: This dictation was prepared with Dragon/digital dictation along with Smartphrase technology. Any transcriptional errors that result from this process are unintentional.

## 2014-02-22 NOTE — Progress Notes (Signed)
PT Cancellation Note  Patient Details Name: Stephen Costa MRN: [id number] DOB: 12-11-1955   Cancelled Treatment:    Reason Eval/Treat Not Completed: Pain limiting ability to participate   Claretha Cooper 02/22/2014, 2:59 PM Tresa Endo PT [phone]

## 2014-02-23 MED ORDER — ACETAMINOPHEN 500 MG PO TABS
1000.0000 mg | ORAL_TABLET | Freq: Four times a day (QID) | ORAL | Status: DC
  Administered 2014-02-23 – 2014-02-24 (×8): 1000 mg via ORAL
  Filled 2014-02-23 (×12): qty 2

## 2014-02-23 MED ORDER — METOPROLOL TARTRATE 25 MG PO TABS
25.0000 mg | ORAL_TABLET | Freq: Two times a day (BID) | ORAL | Status: DC
  Administered 2014-02-23 – 2014-02-26 (×7): 25 mg via ORAL
  Filled 2014-02-23 (×8): qty 1

## 2014-02-23 MED ORDER — HYDROMORPHONE HCL PF 1 MG/ML IJ SOLN
1.0000 mg | INTRAMUSCULAR | Status: DC | PRN
  Administered 2014-02-23 – 2014-02-25 (×23): 1 mg via INTRAVENOUS
  Filled 2014-02-23 (×23): qty 1

## 2014-02-23 NOTE — Progress Notes (Signed)
RT responded to pt. Room to see if he wanted to place on cpap. Pt. States he was ok without the cpap. Pt. Stated he places the cpap on himself when he is ready.

## 2014-02-23 NOTE — Progress Notes (Signed)
[place], MD, [address] [place], [place] [address] Phone: [phone] FAX: [place] [phone] Oct 09, 1955  CARE TEAM:  PCP: Lynne Logan, MD  Outpatient Care Team: Patient Care Team: Lynne Logan, MD as PCP - General (Family Medicine) [name], MD as Consulting Physician (Gastroenterology) Adin Hector, MD as Consulting Physician (General Surgery) Ladell Pier, MD as Consulting Physician (Oncology) Marye Round, MD as Consulting Physician (Radiation Oncology) Carola Frost, RN as Registered Nurse  Inpatient Treatment Team: Treatment Team: Attending Provider: Adin Hector, MD; Registered Nurse: Richardson Landry, RN; Registered Nurse: Judie Grieve, RN; Physical Therapist: Claretha Cooper, PT; Consulting Physician: Ladell Pier, MD; Registered Nurse: Heriberto Antigua, RN; Occupational Therapist: Lesle Chris, OT   Subjective:  Pain controlled OK - bloated & needing narcortics q2H Sitting in chair Right hand less numb w better grasp (h/o this in past w wrist fx).  Left hand normal now  Objective:  Vital signs:  Filed Vitals:   02/23/14 0400 02/23/14 0428 02/23/14 0500 02/23/14 0600  BP:  153/69    Pulse: 101 103 109 109  Temp:  97.9 F (36.6 C)    TempSrc:  Oral    Resp: 12 11 14 15   Height:      Weight:      SpO2: 95% 95% 96% 95%    Last BM Date: 02/20/14  Intake/Output   Yesterday:  05/27 0701 - 05/28 0700 In: 2155 [P.O.:360; I.V.:1725] Out: 2765 [GBMSX:1155; Drains:300] This shift:     Bowel function:  Flatus: yes - scant  BM: scant thin sanguinobilious effluent  Drain: Serosanguinous.  Mild leaking around drain  Physical Exam:  General: Pt awake/alert/oriented x4 in no acute distress Eyes: PERRL, normal EOM.  Sclera clear.  No icterus Neuro: CN II-XII intact.  Normal hand grips & mild paraesthesias R hand - better.   No other focal sensory/motor deficits. Lymph: No head/neck/groin lymphadenopathy Psych:  No delerium/psychosis/paranoia HENT: Normocephalic, Mucus membranes moist.  No thrush Neck: Supple, No tracheal deviation Chest: No chest wall pain w good excursion.  Mild erythema/swelling left nipple - improved from preop CV:  Pulses intact.  Regular rhythm.  HR 11-120s in chair MS: Normal AROM mjr joints.  No obvious deformity Abdomen: Soft.  Nondistended.  Ostomy R abdomen mildly dusky - +gas/thin effluent.  Mildly tender at incisions only.  No evidence of peritonitis.  No incarcerated hernias. Ext:  SCDs BLE.  No mjr edema.  No cyanosis Skin: No petechiae / purpura   Problem List:   Principal Problem:   Rectal cancer - Anterior 8cm from anal verge Active Problems:   Umbilical hernia - 1cm   Obesity (BMI 30-39.9)   Tobacco abuse   Assessment  Stephen Costa  58 y.o. male  2 Days Post-Op  Procedure(s): ROBOTIC SPLENIC FLEXURE MOBILIZATION WITH LOW ANTERIOR RESECTION AND DIVERTING LOOP ILEOSTOMY RIGID PROCTOSCOPY  OK  Plan:  -improve pain control w inc HR & BP -transfer to floor -clears until ileus more resolved - adv diet slowly per protocol -PT/OT evals for hand fx - should recover -ostomy care/ training -f/u path -CPAP for OSA -Augmentin for left breast cellultis - 8 more days -VTE prophylaxis- SCDs, etc -mobilize as tolerated to help recovery  I updated the patient's status to the patient & RN.  Recommendations were made.  Questions were answered.  They expressed understanding &  appreciation.   Adin Hector, M.D., F.A.C.S. Gastrointestinal and Minimally Invasive Surgery [facility] [address], [place], [place] [phone] Main / Paging   02/23/2014   Results:   Labs: Results for orders placed during the hospital encounter of 02/21/14 (from the past 48 hour(s))  POCT I-STAT 7, (LYTES, BLD GAS, ICA,H+H)     Status:  Abnormal   Collection Time    02/21/14 10:17 AM      Result Value Ref Range   pH, Arterial 7.374  7.350 - 7.450   pCO2 arterial 29.5 (*) 35.0 - 45.0 mmHg   pO2, Arterial 79.0 (*) 80.0 - 100.0 mmHg   Bicarbonate 17.2 (*) 20.0 - 24.0 mEq/L   TCO2 18  0 - 100 mmol/L   O2 Saturation 95.0     Acid-base deficit 7.0 (*) 0.0 - 2.0 mmol/L   Sodium 148 (*) 137 - 147 mEq/L   Potassium 2.8 (*) 3.7 - 5.3 mEq/L   Calcium, Ion 1.02 (*) 1.12 - 1.23 mmol/L   HCT 31.0 (*) 39.0 - 52.0 %   Hemoglobin 10.5 (*) 13.0 - 17.0 g/dL   Sample type ARTERIAL    BLOOD GAS, ARTERIAL     Status: Abnormal   Collection Time    02/21/14 11:06 AM      Result Value Ref Range   FIO2 1.00     Delivery systems VENTILATOR     Mode PRESSURE MODE (SURGERY)     VT 800     Rate 13     Peep/cpap 7.0     pH, Arterial 7.394  7.350 - 7.450   pCO2 arterial 40.1  35.0 - 45.0 mmHg   pO2, Arterial 104.0 (*) 80.0 - 100.0 mmHg   Bicarbonate 23.9  20.0 - 24.0 mEq/L   TCO2 21.1  0 - 100 mmol/L   Acid-base deficit 0.3  0.0 - 2.0 mmol/L   O2 Saturation 97.4     Patient temperature 98.6     Collection site A-LINE     Drawn by [phone]     Sample type ARTERIAL DRAW    BASIC METABOLIC PANEL     Status: Abnormal   Collection Time    02/22/14  3:15 AM      Result Value Ref Range   Sodium 137  137 - 147 mEq/L   Comment: DELTA CHECK NOTED   Potassium 4.3  3.7 - 5.3 mEq/L   Comment: NO VISIBLE HEMOLYSIS     DELTA CHECK NOTED   Chloride 100  96 - 112 mEq/L   CO2 26  19 - 32 mEq/L   Glucose, Bld 165 (*) 70 - 99 mg/dL   BUN 12  6 - 23 mg/dL   Creatinine, Ser 0.73  0.50 - 1.35 mg/dL   Calcium 8.2 (*) 8.4 - 10.5 mg/dL   GFR calc non Af Amer >90  >90 mL/min   GFR calc Af Amer >90  >90 mL/min   Comment: (NOTE)     The eGFR has been calculated using the CKD EPI equation.     This calculation has not been validated in all clinical situations.     eGFR's persistently <90 mL/min signify possible Chronic Kidney     Disease.  CBC      Status: Abnormal   Collection Time    02/22/14  3:15 AM      Result Value Ref Range   WBC 15.1 (*) 4.0 - 10.5 K/uL  RBC 3.82 (*) 4.22 - 5.81 MIL/uL   Hemoglobin 12.7 (*) 13.0 - 17.0 g/dL   Comment: DELTA CHECK NOTED     REPEATED TO VERIFY   HCT 35.7 (*) 39.0 - 52.0 %   MCV 93.5  78.0 - 100.0 fL   MCH 33.2  26.0 - 34.0 pg   MCHC 35.6  30.0 - 36.0 g/dL   RDW 12.9  11.5 - 15.5 %   Platelets 294  150 - 400 K/uL  MAGNESIUM     Status: None   Collection Time    02/22/14  3:15 AM      Result Value Ref Range   Magnesium 1.6  1.5 - 2.5 mg/dL    Imaging / Studies: Dg Chest Portable 1 View  02/21/2014   CLINICAL DATA:  Hypoxia.  EXAM: PORTABLE CHEST - 1 VIEW  COMPARISON:  Chest CT 09/30/2013.  FINDINGS: Endotracheal tube and NG tube noted in good anatomic position. Right upper lobe atelectatic changes noted. No pleural effusion or pneumothorax. Lucencies are noted in the right humeral proximal metaphysis. Right humerus series suggested for further evaluation. No acute bony abnormality .  IMPRESSION: 1. Right upper lobe atelectatic changes. 2. Endotracheal tube and NG tube in good anatomic position. 3. Lucencies within the proximal metaphysis of the right humerus, right humerus series suggested for further evaluation.   Electronically Signed   By: Marcello Moores  Register   On: 02/21/2014 14:54    Medications / Allergies: per chart  Antibiotics: Anti-infectives   Start     Dose/Rate Route Frequency Ordered Stop   02/22/14 2200  amoxicillin-clavulanate (AUGMENTIN) 875-125 MG per tablet 1 tablet     1 tablet Oral 2 times daily 02/22/14 0722 03/02/14 2159   02/21/14 2359  cefoTEtan (CEFOTAN) 2 g in dextrose 5 % 50 mL IVPB     2 g 100 mL/hr over 30 Minutes Intravenous Every 12 hours 02/21/14 1721 02/22/14 0027   02/21/14 0630  clindamycin (CLEOCIN) 900 mg, gentamicin (GARAMYCIN) 240 mg in sodium chloride 0.9 % 1,000 mL for intraperitoneal lavage  Status:  Discontinued    Comments:  Pharmacy may adjust  dosing strength, schedule, rate of infusion, etc as needed to optimize therapy    Intraperitoneal To Surgery 02/21/14 0535 02/21/14 1706   02/21/14 0535  cefoTEtan (CEFOTAN) 2 g in dextrose 5 % 50 mL IVPB     2 g 100 mL/hr over 30 Minutes Intravenous On call to O.R. 02/21/14 0535 02/21/14 1245       Note: This dictation was prepared with Dragon/digital dictation along with Smartphrase technology. Any transcriptional errors that result from this process are unintentional.

## 2014-02-23 NOTE — Progress Notes (Signed)
Placed pt on CPAP at 10cmH2O with 4Lpm of oxygen bled in. Sterile water was added for humidification. Pt looks comfortable and is tolerating CPAP well at this time. RT will continue to monitor as needed.

## 2014-02-23 NOTE — Evaluation (Signed)
Physical Therapy Evaluation Patient Details Name: Stephen Costa MRN: [id number] DOB: 12/07/1955 Today's Date: 02/23/2014   History of Present Illness  58 yo male admitted 01/28/14 with diagnosis of rectal cancer. S/P low anterior resection diverting loop iliostomy on 02/21/14.  Clinical Impression  Pt ambulated x 225', reports feeling weak and dizzy near end of walk. BP WNL. Pt did desat on RA prior to beginning ambulation so utiilzed O@ for ambulation. Pt will benefit from PT while in acute care to address problems listed in note below to return to modified independence .     Follow Up Recommendations No PT follow up    Equipment Recommendations  Rolling walker with 5" wheels    Recommendations for Other Services       Precautions / Restrictions Precautions Precautions: Fall Precaution Comments: drain on l, On Q Restrictions Weight Bearing Restrictions: No      Mobility  Bed Mobility                  Transfers Overall transfer level: Needs assistance Equipment used: Rolling walker (2 wheeled) Transfers: Sit to/from Stand Sit to Stand: Mod assist;+2 physical assistance;+2 safety/equipment         General transfer comment: lifting assistance from recliner   Ambulation/Gait Ambulation/Gait assistance: Min assist;+2 safety/equipment Ambulation Distance (Feet): 225 Feet Assistive device: Rolling walker (2 wheeled) Gait Pattern/deviations: Step-through pattern     General Gait Details: cues for slow pace,   Stairs            Wheelchair Mobility    Modified Rankin (Stroke Patients Only)       Balance                                             Pertinent Vitals/Pain 7-8 low abdomen, premedicated.    Home Living Family/patient expects to be discharged to:: Private residence Living Arrangements: Spouse/significant other Available Help at Discharge: Family Type of Home: Apartment Home Access: Stairs to enter Entrance  Stairs-Rails: Psychiatric nurse of Steps: flight Home Layout: One level Home Equipment: None      Prior Function Level of Independence: Independent               Hand Dominance        Extremity/Trunk Assessment   Upper Extremity Assessment: Generalized weakness           Lower Extremity Assessment: Generalized weakness      Cervical / Trunk Assessment: Normal  Communication   Communication: No difficulties  Cognition Arousal/Alertness: Awake/alert Behavior During Therapy: WFL for tasks assessed/performed Overall Cognitive Status: Within Functional Limits for tasks assessed                      General Comments      Exercises        Assessment/Plan    PT Assessment Patient needs continued PT services  PT Diagnosis Difficulty walking;Acute pain   PT Problem List Decreased activity tolerance;Decreased mobility;Pain  PT Treatment Interventions DME instruction;Gait training;Stair training;Functional mobility training;Therapeutic activities;Patient/family education   PT Goals (Current goals can be found in the Care Plan section) Acute Rehab PT Goals Patient Stated Goal: To walk, go home PT Goal Formulation: With patient Time For Goal Achievement: 03/09/14 Potential to Achieve Goals: Good    Frequency Min 3X/week   Barriers to discharge  Co-evaluation               End of Session   Activity Tolerance: Patient tolerated treatment well Patient left: in chair;with call bell/phone within reach Nurse Communication: Mobility status         Time: 5003-7048 PT Time Calculation (min): 25 min   Charges:   PT Evaluation $Initial PT Evaluation Tier I: 1 Procedure PT Treatments $Gait Training: 23-37 mins   PT G Codes:          Claretha Cooper 02/23/2014, 11:19 AM Tresa Endo PT [phone]

## 2014-02-23 NOTE — Consult Note (Signed)
WOC ostomy consult note Stoma type/location: RLQ Ileostomy Stomal assessment/size: 1 and 3/4 inches round with stoma bridge (red rubber catheter) intact Peristomal assessment: Area of medical adhesive related skin injury (MARSI)  from 8-11 o'clock measuring 5cm x 3cm x 0.2cm Treatment options for stomal/peristomal skin: Dusted with Premium pectin powder, topped with hydrocolloid dressing Output scant amount of serosanguinous effluent in old pouch Ostomy pouching: 2pc., 2 and 1/4 inch pouching system with skin barrier ring.  Education provided: Patient uncomfortable with pouch change as area of epidermis lifted with removal of pouch applied in OR (medical adhesive related skin injury, or MARSI).  Eager for wife to be present for teaching session and we have made an appointment for tomorrow at 2pm. Educational booklet left in room. [place] nursing team will follow, andwill remain available to this patient, the nursing, surgical and medical teams. Thanks, Maudie Flakes, MSN, RN, [place], [place], [place] [phone])

## 2014-02-23 NOTE — Evaluation (Signed)
Occupational Therapy Evaluation Patient Details Name: Stephen Costa MRN: [id number] DOB: 1956-01-19 Today's Date: 02/23/2014    History of Present Illness 58 yo male admitted 01/28/14 with diagnosis of rectal cancer. S/P low anterior resection diverting loop iliostomy on 02/21/14.   Clinical Impression   This 58 year old man is s/p iliostomy for rectal CA.  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  Wife/sister will be with him 24/7 for 2-3 weeks and will help with any adls as needed.  Will follow in acute to determine if any DME is needed.      Follow Up Recommendations  No OT follow up    Equipment Recommendations   (to be further assessed:  pt doesn't want ETS/3:1)    Recommendations for Other Services       Precautions / Restrictions Precautions Precautions: Fall Precaution Comments: drain on l, On Q Restrictions Weight Bearing Restrictions: No      Mobility Bed Mobility Overal bed mobility: Needs Assistance Bed Mobility: Sit to Supine       Sit to supine: Min assist   General bed mobility comments: assist for LEs  Transfers Overall transfer level: Needs assistance Equipment used: Rolling walker (2 wheeled) Transfers: Sit to/from Stand Sit to Stand: Min assist         General transfer comment: from recliner    Balance                                            ADL Overall ADL's : Needs assistance/impaired             Lower Body Bathing: Moderate assistance;Sit to/from stand       Lower Body Dressing: Maximal assistance;Sit to/from stand   Toilet Transfer: Minimal assistance;Stand-pivot;RW             General ADL Comments: Performed SPT to bed.  wife present and will help with adls.  They anticipate he will need to sponge bathe for awhile.  He doesn't like DME for toilet and hopes he can access standard commode. Pt is able to complete UB adls with set up     Vision                      Perception     Praxis      Pertinent Vitals/Pain No c/o pain.  Pt has OnQ     Hand Dominance     Extremity/Trunk Assessment Upper Extremity Assessment Upper Extremity Assessment: Generalized weakness (c/o absent feeling L hand since sx/side of BP cuff)   Lower Extremity Assessment Lower Extremity Assessment: Generalized weakness   Cervical / Trunk Assessment Cervical / Trunk Assessment: Normal   Communication Communication Communication: No difficulties   Cognition Arousal/Alertness: Awake/alert Behavior During Therapy: WFL for tasks assessed/performed Overall Cognitive Status: Within Functional Limits for tasks assessed                     General Comments       Exercises       Shoulder Instructions      Home Living Family/patient expects to be discharged to:: Private residence Living Arrangements: Spouse/significant other Available Help at Discharge: Family Type of Home: Apartment Home Access: Stairs to enter Technical brewer of Steps: flight Entrance Stairs-Rails: Right;Left Home Layout: One level     Bathroom Shower/Tub:  Tub/shower unit Shower/tub characteristics: Scientist, water quality: None          Prior Functioning/Environment Level of Independence: Independent        Comments: will have wife or sister 24/7 for first 2-3 weeks    OT Diagnosis: Generalized weakness   OT Problem List: Decreased strength;Decreased activity tolerance;Decreased knowledge of use of DME or AE;Pain;Cardiopulmonary status limiting activity   OT Treatment/Interventions: Self-care/ADL training;DME and/or AE instruction;Patient/family education    OT Goals(Current goals can be found in the care plan section) Acute Rehab OT Goals Patient Stated Goal: home OT Goal Formulation: With patient Time For Goal Achievement: 03/09/14 Potential to Achieve Goals: Good ADL Goals Pt Will Transfer to Toilet: with  supervision;ambulating (standard vs. raised toilet)  OT Frequency: Min 2X/week   Barriers to D/C:            Co-evaluation              End of Session    Activity Tolerance: Patient tolerated treatment well Patient left: in bed;with call bell/phone within reach   Time: 1344-1358 OT Time Calculation (min): 14 min Charges:  OT General Charges $OT Visit: 1 Procedure OT Evaluation $Initial OT Evaluation Tier I: 1 Procedure OT Treatments $Therapeutic Activity: 8-22 mins G-Codes:    Lesle Chris 2014/03/13, 2:19 PM Lesle Chris, OTR/L [phone] Mar 13, 2014

## 2014-02-24 ENCOUNTER — Telehealth (INDEPENDENT_AMBULATORY_CARE_PROVIDER_SITE_OTHER): Payer: Self-pay

## 2014-02-24 ENCOUNTER — Encounter (HOSPITAL_COMMUNITY): Payer: Self-pay | Admitting: Surgery

## 2014-02-24 DIAGNOSIS — Z932 Ileostomy status: Secondary | ICD-10-CM

## 2014-02-24 DIAGNOSIS — I10 Essential (primary) hypertension: Secondary | ICD-10-CM

## 2014-02-24 LAB — HEMOGLOBIN: HEMOGLOBIN: 9.6 g/dL — AB (ref 13.0–17.0)

## 2014-02-24 LAB — POTASSIUM: Potassium: 3.9 mEq/L (ref 3.7–5.3)

## 2014-02-24 LAB — CREATININE, SERUM
CREATININE: 0.63 mg/dL (ref 0.50–1.35)
GFR calc Af Amer: >90 mL/min (ref >90)
GFR calc non Af Amer: >90 mL/min (ref >90)

## 2014-02-24 MED ORDER — ZOLPIDEM TARTRATE 5 MG PO TABS
5.0000 mg | ORAL_TABLET | Freq: Every evening | ORAL | Status: DC | PRN
  Administered 2014-02-24: 5 mg via ORAL
  Filled 2014-02-24: qty 1

## 2014-02-24 MED ORDER — BISMUTH SUBSALICYLATE 262 MG/15ML PO SUSP
30.0000 mL | Freq: Three times a day (TID) | ORAL | Status: DC | PRN
  Filled 2014-02-24: qty 236

## 2014-02-24 MED ORDER — LOPERAMIDE HCL 2 MG PO CAPS: 2.0000 mg | ORAL_CAPSULE | Freq: Three times a day (TID) | ORAL | Status: DC | PRN

## 2014-02-24 NOTE — Telephone Encounter (Signed)
Message copied by Illene Regulus on Fri Feb 24, 2014  2:15 PM ------      Message from: Adin Hector      Created: Thu Feb 23, 2014  4:09 PM       1.  Please set the patient up for GI Tumor Board.            Dx: Rectal cancer [id number] (0/16LN) s/p LAR 02/21/2014            Surgeon: Johney Maine      Gastroenterologist:       MedOnc: Benay Spice      RadOnc: Lisbeth Renshaw                   ------

## 2014-02-24 NOTE — Consult Note (Signed)
WOC ostomy follow up Stoma type/location: RLQ Ileostomy Stomal assessment/size: 1 and 3/4 inches round, with stoma bridge intact Peristomal assessment: Dressing over unroofed blister intact from yesterday Treatment options for stomal/peristomal skin: Hydrocolloid over area of medical adhesive related skin injury (MARSI) Output Green liquid stool Ostomy pouching: 2pc. 2 and 1/4 inch pouching system with skin barrier ring Education provided: Extended with patient's significant other for ostomy teaching.  Pouch characteristics, stoma characteristics, change and emptying frequency taught; ostomy pouch emptying demonstrated with cleaning of bottom 2-inches of tail closure with toilet paper "wicks" taught. Both asking appropriate questions and are satisfied with answers provided. Appointment made for Sunday at [place] for pouch change instruction. Enrolled patient in [facility] Start Discharge program: Yes [facility] nursing team will follow, and will remain available to this patient, the nursing, surgical and medical teams.   Thanks, Maudie Flakes, MSN, RN, [place], [place], [place] [phone])

## 2014-02-24 NOTE — Telephone Encounter (Signed)
Added pt to GI cancer conference for 03/01/14.

## 2014-02-24 NOTE — Progress Notes (Signed)
RT placed cpap mask on patient. Sterile water was added to water chamber for humidification. Patient tolerating well .

## 2014-02-24 NOTE — Progress Notes (Signed)
Wicks (4) were taken out of wound. New dressing applied. Patient tolerated well. Will continue to monitor.

## 2014-02-24 NOTE — Care Management Note (Signed)
    Page 1 of 2   02/24/2014     4:16:32 PM CARE MANAGEMENT NOTE 02/24/2014  Patient:  [facility]   Account Number:  [id number]  Date Initiated:  02/22/2014  Documentation initiated by:  DAVIS,RHONDA  Subjective/Objective Assessment:   rectal ca with protectomy and new loop colostomy.  post op hypotensive and requiring invasive monitoring.     Action/Plan:   lives at home with spouse. will follow for hhc needs and teaching needs   Anticipated DC Date:  02/25/2014   Anticipated DC Plan:  [place]  In-house referral  NA      DC Planning Services  CM consult      [facility] Choice  NA   Choice offered to / List presented to:  NA   DME arranged  NA        HH arranged  NA      Status of service:  In process, will continue to follow Medicare Important Message given?  NA - LOS <3 / Initial given by admissions (If response is "NO", the following Medicare IM given date fields will be blank) Date Medicare IM given:   Date Additional Medicare IM given:    Discharge Disposition:    Per UR Regulation:  Reviewed for med. necessity/level of care/duration of stay  If discussed at [place] of Stay Meetings, dates discussed:    Comments:  02-24-14 Sunday Spillers Harrison RN 1400 Provided patient and wife with [facility] list for choice, awaiting benefits check for preferred providers.  [id number] Rosana Hoes, RN, BSN, CCM [phone] Chart Reviewed for discharge and hospital needs. Discharge needs at time of review: None present will follow for needs. Review of patient progress due on [id number].

## 2014-02-24 NOTE — Progress Notes (Addendum)
Q Pump removed per MD order. Site Unremarkable. Will continue to monitor. Patient tolerated well. Patient showered with wife present.

## 2014-02-24 NOTE — Progress Notes (Signed)
[place], MD, [address] [place], [place] [address] Phone: [phone] FAX: [phone]    Stephen Costa [id number] 1956/06/19  CARE TEAM:  PCP: Lynne Logan, MD  Outpatient Care Team: Patient Care Team: Lynne Logan, MD as PCP - General (Family Medicine) [name], MD as Consulting Physician (Gastroenterology) Adin Hector, MD as Consulting Physician (General Surgery) Ladell Pier, MD as Consulting Physician (Oncology) Marye Round, MD as Consulting Physician (Radiation Oncology) Carola Frost, RN as Registered Nurse  Inpatient Treatment Team: Treatment Team: Attending Provider: Adin Hector, MD; Registered Nurse: Richardson Landry, RN; Registered Nurse: Judie Grieve, RN; Consulting Physician: Ladell Pier, MD; Registered Nurse: Heriberto Antigua, RN; Physical Therapist: Claretha Cooper, PT; Registered Nurse: Cheree Ditto Thigpen, RN   Subjective:  Pain controlled - less bloated Sitting in chair Right hand even less numb w better grasp (h/o this in past w wrist fx).  Left hand normal  Objective:  Vital signs:  Filed Vitals:   02/23/14 1840 02/23/14 2114 02/23/14 2215 02/24/14 0514  BP: 104/61 135/69  130/84  Pulse: 96 102 101 94  Temp: 98.7 F (37.1 C) 98.3 F (36.8 C)  98.5 F (36.9 C)  TempSrc: Oral Oral  Oral  Resp: 18 18 18 18   Height:      Weight:    248 lb 8 oz (112.719 kg)  SpO2: 93% 86% 93% 87%    Last BM Date: 02/23/14  Intake/Output   Yesterday:  05/28 0701 - 05/29 0700 In: 1167.1 [I.V.:1167.1] Out: 2023 [Urine:1175; Drains:598; Stool:250] This shift:     Bowel function:  Flatus: yes - moderate  BM: scant thin sanguinobilious effluent  Drain: Serosanguinous.  Mild leaking around drain  Physical Exam:  General: Pt awake/alert/oriented x4 in no acute distress Eyes: PERRL, normal EOM.  Sclera clear.  No icterus Neuro: CN II-XII  intact.  Normal hand grips & mild paraesthesias R hand - better.  No other focal sensory/motor deficits. Lymph: No head/neck/groin lymphadenopathy Psych:  No delerium/psychosis/paranoia HENT: Normocephalic, Mucus membranes moist.  No thrush Neck: Supple, No tracheal deviation Chest: No chest wall pain w good excursion.  Mild erythema/swelling left nipple - improved from preop CV:  Pulses intact.  Regular rhythm.  HR 11-120s in chair MS: Normal AROM mjr joints.  No obvious deformity Abdomen: Soft.  Obese Mildly distended but close to baseline.  Ostomy R abdomen less dusky - ++gas/thin effluent.  Mildly tender at incisions only.  No evidence of peritonitis.  No incarcerated hernias. Ext:  SCDs BLE.  No mjr edema.  No cyanosis Skin: No petechiae / purpura   Problem List:   Principal Problem:   Rectal cancer - Anterior 8cm from anal verge Active Problems:   Umbilical hernia - 1cm   Obesity (BMI 30-39.9)   Tobacco abuse   Assessment  Stephen Costa  58 y.o. male  3 Days Post-Op  Procedure(s): ROBOTIC SPLENIC FLEXURE MOBILIZATION WITH LOW ANTERIOR RESECTION AND DIVERTING LOOP ILEOSTOMY RIGID PROCTOSCOPY  Ileus resolving  Plan:  -pain control w inc HR & BP -transfer to floor -fulls - adv diet per protocol -PT/OT evals  -Hand improving - should recover -ostomy care/ training -f/u path - T3N0.  D/w Dr Benay Spice.  Prob complete post-adj Xeloda x 5 more q2wk cycles starting 3-4 weeks post-op -CPAP for OSA -Augmentin for left breast cellultis - improving -VTE prophylaxis- SCDs, etc -mobilize as  tolerated to help recovery  I updated the patient's status to the patient & RN.  Recommendations were made.  Questions were answered.  They expressed understanding & appreciation.   Adin Hector, M.D., F.A.C.S. Gastrointestinal and Minimally Invasive Surgery [facility] [address], [place], [place] [address] [phone] Main /  Paging   02/24/2014   Results:   Labs: Results for orders placed during the hospital encounter of 02/21/14 (from the past 48 hour(s))  POTASSIUM     Status: None   Collection Time    02/24/14  5:04 AM      Result Value Ref Range   Potassium 3.9  3.7 - 5.3 mEq/L  CREATININE, SERUM     Status: None   Collection Time    02/24/14  5:04 AM      Result Value Ref Range   Creatinine, Ser 0.63  0.50 - 1.35 mg/dL   GFR calc non Af Amer >90  >90 mL/min   GFR calc Af Amer >90  >90 mL/min   Comment: (NOTE)     The eGFR has been calculated using the CKD EPI equation.     This calculation has not been validated in all clinical situations.     eGFR's persistently <90 mL/min signify possible Chronic Kidney     Disease.  HEMOGLOBIN     Status: Abnormal   Collection Time    02/24/14  5:04 AM      Result Value Ref Range   Hemoglobin 9.6 (*) 13.0 - 17.0 g/dL    Imaging / Studies: No results found.  Medications / Allergies: per chart  Antibiotics: Anti-infectives   Start     Dose/Rate Route Frequency Ordered Stop   02/22/14 2200  amoxicillin-clavulanate (AUGMENTIN) 875-125 MG per tablet 1 tablet     1 tablet Oral 2 times daily 02/22/14 0722 03/02/14 2159   02/21/14 2359  cefoTEtan (CEFOTAN) 2 g in dextrose 5 % 50 mL IVPB     2 g 100 mL/hr over 30 Minutes Intravenous Every 12 hours 02/21/14 1721 02/22/14 0027   02/21/14 0630  clindamycin (CLEOCIN) 900 mg, gentamicin (GARAMYCIN) 240 mg in sodium chloride 0.9 % 1,000 mL for intraperitoneal lavage  Status:  Discontinued    Comments:  Pharmacy may adjust dosing strength, schedule, rate of infusion, etc as needed to optimize therapy    Intraperitoneal To Surgery 02/21/14 0535 02/21/14 1706   02/21/14 0535  cefoTEtan (CEFOTAN) 2 g in dextrose 5 % 50 mL IVPB     2 g 100 mL/hr over 30 Minutes Intravenous On call to O.R. 02/21/14 0535 02/21/14 1245       Note: This dictation was prepared with Dragon/digital dictation along with Smartphrase  technology. Any transcriptional errors that result from this process are unintentional.

## 2014-02-25 MED ORDER — HYDROCODONE-ACETAMINOPHEN 5-325 MG PO TABS
1.0000 | ORAL_TABLET | ORAL | Status: DC | PRN
  Administered 2014-02-25 – 2014-02-26 (×7): 2 via ORAL
  Filled 2014-02-25 (×7): qty 2

## 2014-02-25 MED ORDER — NAPROXEN 500 MG PO TABS
500.0000 mg | ORAL_TABLET | Freq: Two times a day (BID) | ORAL | Status: DC
Start: 2014-02-25 — End: 2014-02-26
  Administered 2014-02-25 – 2014-02-26 (×2): 500 mg via ORAL
  Filled 2014-02-25 (×4): qty 1

## 2014-02-25 MED ORDER — HYDROMORPHONE HCL PF 1 MG/ML IJ SOLN: 1.0000 mg | INTRAMUSCULAR | Status: DC | PRN

## 2014-02-25 NOTE — Progress Notes (Signed)
OT Cancellation Note  Patient Details Name: Stephen Costa MRN: [id number] DOB: 09/30/1955   Cancelled Treatment:    Reason Eval/Treat Not Completed: Other (comment). In to see pt for treatment with goal of making sure he can get to/from and up/down from a standard toilet. He reports that he has been getting up/down from toilet without issues and actually took a shower by himself this AM--wife agrees. Pt/wife without any further BADL questions, we will sign off.  Almon Register [phone] 02/25/2014, 3:32 PM

## 2014-02-25 NOTE — Progress Notes (Signed)
Offered pt assistance with cpap for bedtime, but he refused at this time.  RT offered to come back later, but he said he would call me when ready.  RN notified.

## 2014-02-25 NOTE — Progress Notes (Signed)
[place], MD, [address] [place], [place] [address] Phone: [phone] FAX: [place] [id number] [birth year]  CARE TEAM:  PCP: Lynne Logan, MD  Outpatient Care Team: Patient Care Team: Lynne Logan, MD as PCP - General (Family Medicine) [name], MD as Consulting Physician (Gastroenterology) Adin Hector, MD as Consulting Physician (General Surgery) Ladell Pier, MD as Consulting Physician (Oncology) Marye Round, MD as Consulting Physician (Radiation Oncology) Carola Frost, RN as Registered Nurse  Inpatient Treatment Team: Treatment Team: Attending Provider: Adin Hector, MD; Registered Nurse: Richardson Landry, RN; Registered Nurse: Judie Grieve, RN; Consulting Physician: Ladell Pier, MD; Registered Nurse: Heriberto Antigua, RN   Subjective:  Pain less Less bloated Walking more Right hand almost WNL - no complaints today.  Left hand normal  Objective:  Vital signs:  Filed Vitals:   02/24/14 1429 02/24/14 2113 02/25/14 0140 02/25/14 0520  BP: 129/77 121/72  126/72  Pulse: 88 93  87  Temp: 98.5 F (36.9 C) 98.1 F (36.7 C)  97.5 F (36.4 C)  TempSrc: Oral Oral  Oral  Resp: 18 18  16   Height:      Weight:   254 lb 4.8 oz (115.35 kg)   SpO2: 92% 90%  95%    Last BM Date: 02/24/14  Intake/Output   Yesterday:  05/29 0701 - 05/30 0700 In: 1800 [P.O.:600; I.V.:1200] Out: 2050 [Urine:550; Drains:550; Stool:950] This shift:     Bowel function:  Flatus: yes - moderate  BM: scant thin sanguinobilious effluent  Drain: Serous.  Mild leaking around drain  Physical Exam:  General: Pt awake/alert/oriented x4 in no acute distress Eyes: PERRL, normal EOM.  Sclera clear.  No icterus Neuro: CN II-XII intact.  Normal hand grips & min paraesthesias R hand - better.  No other focal sensory/motor deficits. Lymph: No head/neck/groin  lymphadenopathy Psych:  No delerium/psychosis/paranoia.  Smiling Chatty HENT: Normocephalic, Mucus membranes moist.  No thrush Neck: Supple, No tracheal deviation Chest: No chest wall pain w good excursion.  Mild erythema/swelling left nipple - improved from preop CV:  Pulses intact.  Regular rhythm.  HR 11-120s in chair MS: Normal AROM mjr joints.  No obvious deformity Abdomen: Soft.  Obese.  Nondistended at baseline.  Ostomy R abdomen less dusky - ++gas/thin effluent.  Mildly tender at incisions only.  MIn drianage at apex.  No incarcerated hernias. Ext:  SCDs BLE.  No mjr edema.  No cyanosis Skin: No petechiae / purpura   Problem List:   Principal Problem:   Rectal cancer ypT3ypN0 (0/16 LN) s/p LAR/ileostomy 02/21/2014 Active Problems:   Umbilical hernia s/p repair 02/21/2014   Obesity (BMI 30-39.9)   Tobacco abuse   Ileostomy in place   Assessment  Stephen Costa  58 y.o. male  4 Days Post-Op  Procedure(s): ROBOTIC SPLENIC FLEXURE MOBILIZATION WITH LOW ANTERIOR RESECTION AND DIVERTING LOOP ILEOSTOMY RIGID PROCTOSCOPY  Ileus resolving  Plan:  -adv diet per protocol to solids -PT/OT evals  -Hand improving - should recover -ostomy care/ training -f/u path - T3N0.  D/w Dr Benay Spice.  Prob complete post-adj Xeloda x 5 more q2wk cycles starting 3-4 weeks post-op -CPAP for OSA -Augmentin for left breast cellultis - improving -VTE prophylaxis- SCDs, etc -mobilize as tolerated to help recovery  I updated the patient's status to the patient & RN.  Recommendations were made.  Questions were answered.  They expressed understanding & appreciation.   Adin Hector, M.D., F.A.C.S. Gastrointestinal and Minimally Invasive Surgery [facility] [address] [place], [place] [address] [phone] Main / Paging   02/25/2014   Results:   Labs: Results for orders placed during the hospital encounter of 02/21/14 (from the past 48 hour(s))   POTASSIUM     Status: None   Collection Time    02/24/14  5:04 AM      Result Value Ref Range   Potassium 3.9  3.7 - 5.3 mEq/L  CREATININE, SERUM     Status: None   Collection Time    02/24/14  5:04 AM      Result Value Ref Range   Creatinine, Ser 0.63  0.50 - 1.35 mg/dL   GFR calc non Af Amer >90  >90 mL/min   GFR calc Af Amer >90  >90 mL/min   Comment: (NOTE)     The eGFR has been calculated using the CKD EPI equation.     This calculation has not been validated in all clinical situations.     eGFR's persistently <90 mL/min signify possible Chronic Kidney     Disease.  HEMOGLOBIN     Status: Abnormal   Collection Time    02/24/14  5:04 AM      Result Value Ref Range   Hemoglobin 9.6 (*) 13.0 - 17.0 g/dL    Imaging / Studies: No results found.  Medications / Allergies: per chart  Antibiotics: Anti-infectives   Start     Dose/Rate Route Frequency Ordered Stop   02/22/14 2200  amoxicillin-clavulanate (AUGMENTIN) 875-125 MG per tablet 1 tablet     1 tablet Oral 2 times daily 02/22/14 0722 03/02/14 2159   02/21/14 2359  cefoTEtan (CEFOTAN) 2 g in dextrose 5 % 50 mL IVPB     2 g 100 mL/hr over 30 Minutes Intravenous Every 12 hours 02/21/14 1721 02/22/14 0027   02/21/14 0630  clindamycin (CLEOCIN) 900 mg, gentamicin (GARAMYCIN) 240 mg in sodium chloride 0.9 % 1,000 mL for intraperitoneal lavage  Status:  Discontinued    Comments:  Pharmacy may adjust dosing strength, schedule, rate of infusion, etc as needed to optimize therapy    Intraperitoneal To Surgery 02/21/14 0535 02/21/14 1706   02/21/14 0535  cefoTEtan (CEFOTAN) 2 g in dextrose 5 % 50 mL IVPB     2 g 100 mL/hr over 30 Minutes Intravenous On call to O.R. 02/21/14 0535 02/21/14 1245       Note: This dictation was prepared with Dragon/digital dictation along with Smartphrase technology. Any transcriptional errors that result from this process are unintentional.

## 2014-02-26 NOTE — Progress Notes (Signed)
RT checked back with pt two more times for cpap, and now pt states he doesn't want it.  Pt advised that RT is available all night, and to let his nurse know if he changes his mind.  RN aware.

## 2014-02-26 NOTE — Progress Notes (Signed)
[place], MD, [address] [place], [place] [address] Phone: [phone] FAX: [phone]    Stephen Costa [id number] May 02, 1956  CARE TEAM:  PCP: Lynne Logan, MD  Outpatient Care Team: Patient Care Team: Lynne Logan, MD as PCP - General (Family Medicine) [name], MD as Consulting Physician (Gastroenterology) Adin Hector, MD as Consulting Physician (General Surgery) Ladell Pier, MD as Consulting Physician (Oncology) Marye Round, MD as Consulting Physician (Radiation Oncology) Carola Frost, RN as Registered Nurse  Inpatient Treatment Team: Treatment Team: Attending Provider: Adin Hector, MD; Registered Nurse: Judie Grieve, RN; Consulting Physician: Ladell Pier, MD; Registered Nurse: Heriberto Antigua, RN; Registered Nurse: Kristopher Oppenheim, RN   Subjective:  Pain minimal Eating solids well Not bloated Walking well RN Nevin Bloodgood in room Pt says he & wife comfortable w drain & ostomy  Objective:  Vital signs:  Filed Vitals:   02/25/14 1400 02/25/14 2113 02/26/14 0525 02/26/14 0735  BP: 134/74 125/77 130/81   Pulse: 96 91 101   Temp: 98 F (36.7 C) 97.9 F (36.6 C) 98.1 F (36.7 C)   TempSrc: Oral Oral Oral   Resp: 16 18 18    Height:      Weight:    244 lb 14.4 oz (111.086 kg)  SpO2: 95% 91% 91%     Last BM Date: 02/24/14  Intake/Output   Yesterday:  05/30 0701 - 05/31 0700 In: 58 [P.O.:720] Out: 2405 [Urine:700; Drains:355; Stool:1350] This shift:     Bowel function:  Flatus: yes - moderate  BM: mildly thick effluent  Drain: Serous.  Min leaking around drain  Physical Exam:  General: Pt awake/alert/oriented x4 in no acute distress Eyes: PERRL, normal EOM.  Sclera clear.  No icterus Neuro: CN II-XII intact.  No focal sensory/motor deficits. Lymph: No head/neck/groin lymphadenopathy Psych:  No delerium/psychosis/paranoia.  Smiling  Chatty HENT: Normocephalic, Mucus membranes moist.  No thrush Neck: Supple, No tracheal deviation Chest: No chest wall pain w good excursion.  Mild erythema/swelling left nipple - improved from preop CV:  Pulses intact.  Regular rhythm.   MS: Normal AROM mjr joints.  No obvious deformity Abdomen: Soft.  Obese.  Nondistended & at baseline.  Ostomy R abdomen more pink - ++gas/thin effluent.  Miln tender at incisions only.  MIn drianage at apex.  No incarcerated hernias. Ext:  SCDs BLE.  No mjr edema.  No cyanosis Skin: No petechiae / purpura   Problem List:   Principal Problem:   Rectal cancer ypT3ypN0 (0/16 LN) s/p LAR/ileostomy 02/21/2014 Active Problems:   Umbilical hernia s/p repair 02/21/2014   Obesity (BMI 30-39.9)   Tobacco abuse   Ileostomy in place   Assessment  Jaycub Noorani  58 y.o. male  5 Days Post-Op  Procedure(s): ROBOTIC SPLENIC FLEXURE MOBILIZATION WITH LOW ANTERIOR RESECTION AND DIVERTING LOOP ILEOSTOMY RIGID PROCTOSCOPY  Improved  Plan:  -prob d/c later today if ileostomy teaching goes well -d/c drain -Hand improved -ostomy care/ training -f/u path - T3N0.  D/w Dr Benay Spice.  Prob complete post-adj Xeloda x 5 more q2wk cycles starting 3-4 weeks post-op -CPAP for OSA -Augmentin for left breast cellultis - improving -VTE prophylaxis- SCDs, etc -mobilize as tolerated to help recovery  I updated the patient's status to the patient & RN.  Recommendations were made.  Questions were answered.  They expressed understanding & appreciation.   Adin Hector, M.D.,  F.A.C.S. Gastrointestinal and Minimally Invasive Surgery [facility] [address] [place], [place] [address] [phone] Main / Paging   02/26/2014   Results:   Labs: No results found for this or any previous visit (from the past 48 hour(s)).  Imaging / Studies: No results found.  Medications / Allergies: per chart  Antibiotics: Anti-infectives    Start     Dose/Rate Route Frequency Ordered Stop   02/22/14 2200  amoxicillin-clavulanate (AUGMENTIN) 875-125 MG per tablet 1 tablet     1 tablet Oral 2 times daily 02/22/14 0722 03/02/14 2159   02/21/14 2359  cefoTEtan (CEFOTAN) 2 g in dextrose 5 % 50 mL IVPB     2 g 100 mL/hr over 30 Minutes Intravenous Every 12 hours 02/21/14 1721 02/22/14 0027   02/21/14 0630  clindamycin (CLEOCIN) 900 mg, gentamicin (GARAMYCIN) 240 mg in sodium chloride 0.9 % 1,000 mL for intraperitoneal lavage  Status:  Discontinued    Comments:  Pharmacy may adjust dosing strength, schedule, rate of infusion, etc as needed to optimize therapy    Intraperitoneal To Surgery 02/21/14 0535 02/21/14 1706   02/21/14 0535  cefoTEtan (CEFOTAN) 2 g in dextrose 5 % 50 mL IVPB     2 g 100 mL/hr over 30 Minutes Intravenous On call to O.R. 02/21/14 0535 02/21/14 1245       Note: This dictation was prepared with Dragon/digital dictation along with Smartphrase technology. Any transcriptional errors that result from this process are unintentional.

## 2014-02-26 NOTE — Progress Notes (Signed)
AVS reviewed with pt and significant other. Both verbalized understanding of dc instructions. Script x 1 given as provided by MD. Awaiting WOC RN to come change ostomy bag and teach wife.

## 2014-02-26 NOTE — Progress Notes (Signed)
Assessment unchanged. Pt and wife verbalized understanding of final instructions from [facility] RN who changed ostomy pouch prior to dc. Pt independently emptied ostomy bag in BR before leaving and demonstrated understanding on how to clean end with toilet paper as taught by Einar Pheasant, RN. Home health plans/needs completed. Discharged via wc to front entrance to meet awaiting vehicle to carry home. Accompanied by wife and NT.

## 2014-02-26 NOTE — Consult Note (Signed)
WOC ostomy follow up Stoma type/location: RLQ Ileostomy Stomal assessment/size: 1 and 5/8 inches round.  Stoma bridge removed today per Dr. Johney Maine' request. Peristomal assessment:  Dressing over large parastomal blister removed today.  Size is unchanged (5cm x 3cm x 0.2cm), but area is clean and granulating Treatment options for stomal/peristomal skin: Hydrocolloid placed over parastomal blister (partial thickness tissue loss secondary to medical adhesive related skin injury, MARSI) Output thin brown stool Ostomy pouching: 2pc 2 and 1/4 inch pouching system with skin barrier ring.  Education provided: Extended session for ostomy teaching:  Parastomal shaving technique (dry powder shave) demonstrated and frequency taught.  Dietary instructions for sick days:  BRAT diet taught, also to consume 1 glass of fluid for every pouch empty.  Demonstration of application of hydrocolloid dressing, followed by pouch change provided. Enrolled patient in [place] [facility] Discharge program: Yes Patient supplied with 2 week's worth of discharge supplies and is established with [facility]. Ready for discharge from a [place]. Thanks, Maudie Flakes, MSN, RN, [place], [place], [place] [phone])

## 2014-02-26 NOTE — Progress Notes (Signed)
CARE MANAGEMENT NOTE 02/26/2014  Patient:  [name]   Account Number:  [id number]  Date Initiated:  02/22/2014  Documentation initiated by:  DAVIS,RHONDA  Subjective/Objective Assessment:   rectal ca with protectomy and new loop colostomy.  post op hypotensive and requiring invasive monitoring.     Action/Plan:   lives at home with spouse. will follow for hhc needs and teaching needs   Anticipated DC Date:  02/25/2014   Anticipated DC Plan:  [place]  In-house referral  NA      DC Planning Services  CM consult      [facility] Choice  HOME HEALTH   Choice offered to / List presented to:  C-1 Patient   DME arranged  NA        [place] [facility] arranged  HH-1 RN      [place].   Status of service:  Completed, signed off Medicare Important Message given?  NA - LOS <3 / Initial given by admissions (If response is "NO", the following Medicare IM given date fields will be blank) Date Medicare IM given:   Date Additional Medicare IM given:    Discharge Disposition:  [place]  Per UR Regulation:  Reviewed for med. necessity/level of care/duration of stay  If discussed at [place] of Stay Meetings, dates discussed:    Comments:  02/26/2014 1545 NCM spoke to pt and agreed to [facility] for [facility]. Spoke to [facility] rep and they are a preferred provider for [employer]. Jonnie Finner RN CCM Case mgmt phone [phone]  02-24-14 [place] RN 1400 Provided patient and wife with [facility] list for choice, awaiting benefits check for preferred providers.  [id number] Rosana Hoes, RN, BSN, CCM [phone] Chart Reviewed for discharge and hospital needs. Discharge needs at time of review: None present will follow for needs. Review of patient progress due on [id number].

## 2014-02-26 NOTE — Discharge Summary (Signed)
Physician Discharge Summary  Patient ID: Stephen Costa MRN: [id number] DOB/AGE: 1955-10-12 58 y.o.  Admit date: 02/21/2014 Discharge date: 02/26/2014  Admission Diagnoses:  Discharge Diagnoses:  Principal Problem:   Rectal cancer ypT3ypN0 (0/16 LN) s/p LAR/ileostomy 02/21/2014 Active Problems:   Umbilical hernia s/p repair 02/21/2014   Obesity (BMI 30-39.9)   Tobacco abuse   Ileostomy in place   Discharged Condition: good  Hospital Course: Pt s/p low LAR for mid/low rectal cancer.  Watched in [place] for tachycardia & hypoxia.  Improved quickly.  Transferred to floor.  Postoperatively, the patient mobilized in the hallways and advanced to a solid diet gradually.  Pain was well-controlled and transitioned off IV medications.    By the time of discharge, the patient was walking well the hallways, eating food well, having flatus.  Pt had mild right>left hand numbness/weakness that quickly resolved.  Training w ileostomy care done.  Pain was-controlled on an oral regimen.  Based on meeting DC criteria and recovering well, I felt it was safe for the patient to be discharged home with close followup.  Instructions were discussed in detail.  They are written as well.     Consults: Wound/ostomy RN  Significant Diagnostic Studies:   Results for orders placed during the hospital encounter of 02/21/14 (from the past 72 hour(s))  POTASSIUM     Status: None   Collection Time    02/24/14  5:04 AM      Result Value Ref Range   Potassium 3.9  3.7 - 5.3 mEq/L  CREATININE, SERUM     Status: None   Collection Time    02/24/14  5:04 AM      Result Value Ref Range   Creatinine, Ser 0.63  0.50 - 1.35 mg/dL   GFR calc non Af Amer >90  >90 mL/min   GFR calc Af Amer >90  >90 mL/min   Comment: (NOTE)     The eGFR has been calculated using the CKD EPI equation.     This calculation has not been validated in all clinical situations.     eGFR's persistently <90 mL/min signify possible Chronic Kidney   Disease.  HEMOGLOBIN     Status: Abnormal   Collection Time    02/24/14  5:04 AM      Result Value Ref Range   Hemoglobin 9.6 (*) 13.0 - 17.0 g/dL    Treatments:   POST-OPERATIVE DIAGNOSIS: RECTAL CANCER  PROCEDURE: Procedure(s):  ROBOTIC SPLENIC FLEXURE MOBILIZATION  LOW ANTERIOR RESECTION  DIVERTING LOOP ILEOSTOMY  RIGID PROCTOSCOPY  SURGEON: Surgeon(s):  Adin Hector, MD  Leighton Ruff, MD Asst   Discharge Exam: Blood pressure 130/81, pulse 101, temperature 98.1 F (36.7 C), temperature source Oral, resp. rate 18, height _0  (1.702 m), weight 244 lb 14.4 oz (111.086 kg), SpO2 91.00%.  General: Pt awake/alert/oriented x4 in no acute distress  Eyes: PERRL, normal EOM. Sclera clear. No icterus  Neuro: CN II-XII intact. No focal sensory/motor deficits.  Lymph: No head/neck/groin lymphadenopathy  Psych: No delerium/psychosis/paranoia. Smiling Chatty  HENT: Normocephalic, Mucus membranes moist. No thrush  Neck: Supple, No tracheal deviation  Chest: No chest wall pain w good excursion. Mild erythema/swelling left nipple - improved from preop  CV: Pulses intact. Regular rhythm.  MS: Normal AROM mjr joints. No obvious deformity  Abdomen: Soft. Obese. Nondistended & at baseline. Ostomy R abdomen more pink - ++gas/thin effluent. Miln tender at incisions only. MIn drianage at apex. No incarcerated hernias.  Ext: SCDs BLE. No mjr  edema. No cyanosis  Skin: No petechiae / purpura   Disposition: 01-Home or Self Care  Discharge Instructions   Call MD for:  extreme fatigue    Complete by:  As directed      Call MD for:  extreme fatigue    Complete by:  As directed      Call MD for:  hives    Complete by:  As directed      Call MD for:  hives    Complete by:  As directed      Call MD for:  persistant nausea and vomiting    Complete by:  As directed      Call MD for:  persistant nausea and vomiting    Complete by:  As directed      Call MD for:  redness, tenderness, or  signs of infection (pain, swelling, redness, odor or green/yellow discharge around incision site)    Complete by:  As directed      Call MD for:  redness, tenderness, or signs of infection (pain, swelling, redness, odor or green/yellow discharge around incision site)    Complete by:  As directed      Call MD for:  severe uncontrolled pain    Complete by:  As directed      Call MD for:  severe uncontrolled pain    Complete by:  As directed      Call MD for:    Complete by:  As directed   Temperature > 101.71F     Call MD for:    Complete by:  As directed   Temperature > 101.71F     Diet - low sodium heart healthy    Complete by:  As directed      Discharge instructions    Complete by:  As directed   Please see discharge instruction sheets.  Also refer to handout given an office.  Please call our office if you have any questions or concerns [phone]     Discharge instructions    Complete by:  As directed   Please see discharge instruction sheets.  Also refer to handout given an office.  Please call our office if you have any questions or concerns [phone]     Discharge wound care:    Complete by:  As directed   If you have closed incisions, shower and bathe over these incisions with soap and water every day.  Remove all surgical dressings on postoperative day #3.  You do not need to replace dressings over the closed incisions unless you feel more comfortable with a Band-Aid covering it.   If you have an open wound that requires packing, please see wound care instructions.  In general, remove all dressings, wash wound with soap and water and then replace with saline moistened gauze.  Do the dressing change at least every day.  Please call our office [phone] if you have further questions.     Discharge wound care:    Complete by:  As directed   If you have closed incisions, shower and bathe over these incisions with soap and water every day.  Remove all surgical dressings on  postoperative day #3.  You do not need to replace dressings over the closed incisions unless you feel more comfortable with a Band-Aid covering it.   If you have an open wound that requires packing, please see wound care instructions.  In general, remove all dressings, wash wound with soap and water and  then replace with saline moistened gauze.  Do the dressing change at least every day.  Please call our office [phone] if you have further questions.     Driving Restrictions    Complete by:  As directed   No driving until off narcotics and can safely swerve away without pain during an emergency     Driving Restrictions    Complete by:  As directed   No driving until off narcotics and can safely swerve away without pain during an emergency     Increase activity slowly    Complete by:  As directed   Walk an hour a day.  Use 20-30 minute walks.  When you can walk 30 minutes without difficulty, increase to low impact/moderate activities such as biking, jogging, swimming, sexual activity..  Eventually can increase to unrestricted activity when not feeling pain.  If you feel pain: STOP!Marland Kitchen   Let pain protect you from overdoing it.  Use ice/heat/over-the-counter pain medications to help minimize his soreness.  Use pain prescriptions as needed to remain active.  It is better to take extra pain medications and be more active than to stay bedridden to avoid all pain medications.     Increase activity slowly    Complete by:  As directed   Walk an hour a day.  Use 20-30 minute walks.  When you can walk 30 minutes without difficulty, increase to low impact/moderate activities such as biking, jogging, swimming, sexual activity..  Eventually can increase to unrestricted activity when not feeling pain.  If you feel pain: STOP!Marland Kitchen   Let pain protect you from overdoing it.  Use ice/heat/over-the-counter pain medications to help minimize his soreness.  Use pain prescriptions as needed to remain active.  It is better to  take extra pain medications and be more active than to stay bedridden to avoid all pain medications.     Lifting restrictions    Complete by:  As directed   Avoid heavy lifting initially.  Do not push through pain.  You have no specific weight limit.  Coughing and sneezing or four more stressful to your incision than any lifting you will do. Pain will protect you from injury.  Therefore, avoid intense activity until off all narcotic pain medications.  Coughing and sneezing or four more stressful to your incision than any lifting he will do.     Lifting restrictions    Complete by:  As directed   Avoid heavy lifting initially.  Do not push through pain.  You have no specific weight limit.  Coughing and sneezing or four more stressful to your incision than any lifting you will do. Pain will protect you from injury.  Therefore, avoid intense activity until off all narcotic pain medications.  Coughing and sneezing or four more stressful to your incision than any lifting he will do.     May shower / Bathe    Complete by:  As directed      May shower / Bathe    Complete by:  As directed      May walk up steps    Complete by:  As directed      May walk up steps    Complete by:  As directed      Sexual Activity Restrictions    Complete by:  As directed   Sexual activity as tolerated.  Do not push through pain.  Pain will protect you from injury.     Sexual Activity Restrictions    Complete  by:  As directed   Sexual activity as tolerated.  Do not push through pain.  Pain will protect you from injury.     Walk with assistance    Complete by:  As directed   Walk over an hour a day.  May use a walker/cane/companion to help with balance and stamina.     Walk with assistance    Complete by:  As directed   Walk over an hour a day.  May use a walker/cane/companion to help with balance and stamina.            Medication List    STOP taking these medications       amoxicillin-clavulanate 875-125  MG per tablet  Commonly known as:  AUGMENTIN     metroNIDAZOLE 500 MG tablet  Commonly known as:  FLAGYL     neomycin 500 MG tablet  Commonly known as:  MYCIFRADIN      TAKE these medications       HYDROcodone-acetaminophen 5-325 MG per tablet  Commonly known as:  NORCO  Take 1-2 tablets by mouth every 4 (four) hours as needed for moderate pain or severe pain.     naproxen sodium 220 MG tablet  Commonly known as:  ANAPROX  Take 220 mg by mouth 2 (two) times daily with a meal.     prochlorperazine 10 MG tablet  Commonly known as:  COMPAZINE  Take 1 tablet (10 mg total) by mouth every 6 (six) hours as needed for nausea or vomiting.           Follow-up Information   Follow up with Zinnia Tindall C., MD In 2 weeks. (To follow up after your operation)    Specialty:  General Surgery   Contact information:   [address] [place] [address] [phone]       Signed: Adin Hector 02/26/2014, 8:13 AM

## 2014-03-02 ENCOUNTER — Encounter (HOSPITAL_COMMUNITY): Payer: Self-pay

## 2014-03-03 ENCOUNTER — Telehealth: Payer: Self-pay | Admitting: Oncology

## 2014-03-03 NOTE — Telephone Encounter (Signed)
LEFT MESSAGE FOR PATIENT AND GAVE NEW TIIME 11:15 ON 06/09. INSTRUCTED PATIENT TO RETURN CALL TO CONFIRM MESSAGE WAS RECEIVED.

## 2014-03-05 ENCOUNTER — Emergency Department (HOSPITAL_COMMUNITY)
Admission: EM | Admit: 2014-03-05 | Discharge: 2014-03-06 | Disposition: A | Discharge status: Home / Self Care | Payer: Managed Care, Other (non HMO) | Attending: Emergency Medicine | Admitting: Emergency Medicine

## 2014-03-05 ENCOUNTER — Encounter (HOSPITAL_COMMUNITY): Payer: Self-pay | Admitting: Emergency Medicine

## 2014-03-05 DIAGNOSIS — Z87891 Personal history of nicotine dependence: Secondary | ICD-10-CM | POA: Insufficient documentation

## 2014-03-05 DIAGNOSIS — IMO0002 Reserved for concepts with insufficient information to code with codable children: Secondary | ICD-10-CM

## 2014-03-05 DIAGNOSIS — Z85048 Personal history of other malignant neoplasm of rectum, rectosigmoid junction, and anus: Secondary | ICD-10-CM | POA: Insufficient documentation

## 2014-03-05 DIAGNOSIS — Z932 Ileostomy status: Secondary | ICD-10-CM | POA: Insufficient documentation

## 2014-03-05 DIAGNOSIS — Z791 Long term (current) use of non-steroidal anti-inflammatories (NSAID): Secondary | ICD-10-CM | POA: Insufficient documentation

## 2014-03-05 DIAGNOSIS — M1710 Unilateral primary osteoarthritis, unspecified knee: Secondary | ICD-10-CM | POA: Insufficient documentation

## 2014-03-05 DIAGNOSIS — T8131XA Disruption of external operation (surgical) wound, not elsewhere classified, initial encounter: Secondary | ICD-10-CM | POA: Insufficient documentation

## 2014-03-05 DIAGNOSIS — Z8679 Personal history of other diseases of the circulatory system: Secondary | ICD-10-CM | POA: Insufficient documentation

## 2014-03-05 DIAGNOSIS — Y838 Other surgical procedures as the cause of abnormal reaction of the patient, or of later complication, without mention of misadventure at the time of the procedure: Secondary | ICD-10-CM | POA: Insufficient documentation

## 2014-03-05 NOTE — ED Notes (Signed)
Per pt report: pt had surgery 10 days ago and got an ileostomy placed. Pt reports the site is leaking this evening. Pt reports a new hole developed on the edge site near the skin. Pt denies any pain.

## 2014-03-05 NOTE — ED Provider Notes (Signed)
CSN: [id number]     Arrival date & time 03/05/14  2141 History   First MD Initiated Contact with Patient 03/05/14 2229     Chief Complaint  Patient presents with  . Post-op Problem     (Consider location/radiation/quality/duration/timing/severity/associated sxs/prior Treatment) The history is provided by the patient and medical records. No language interpreter was used.    Stephen Costa is a 58 y.o. male  with a hx of rectal cancer, sleep apnea presents to the Emergency Department complaining of appearance of new "hole" next to the colostomy that was created 10 days ago.  Pt with recent diagnosis of rectal cancer and colostomy was created during rectal and colon resection.  Wife reports that he has been healing well, without complication but when she went to clean the site today, the new hole appeared.  There is no drainage from the site.  Pt's colostomy is functioning appropriately. Nothing makes it better or worse. No aggravating or alleviating factors.  Pt denies fever, chills, headache, neck pain, chest pain, shortness of breath, nausea, vomiting, diarrhea, weakness, dizziness, syncope. Patient reports mild abd tenderness at baseline post-op.    Past Medical History  Diagnosis Date  . Rectal cancer 09/26/2013    ypT3ypN0 (0/16 LN)  . Sleep apnea     wears cpap most nights  . Hemorrhoids   . Cancer 10/05/13    rectum  . Allergy   . Arthritis     knees  . History of radiation therapy 10/31/13-12/08/13    rectal 50.4Gy total  . Rectal cancer    Past Surgical History  Procedure Laterality Date  . Lumbar disc surgery  1984, 1987  . Wrist surgery Right 2006  . Shoulder surgery Right 1999  . Surgery scrotal / testicular Left 58 y/o    respositioning into scrotum  . Eus N/A 10/05/2013    Procedure: LOWER ENDOSCOPIC ULTRASOUND (EUS);  Surgeon: [name], MD;  Location: [name];  Service: Endoscopy;  Laterality: N/A;  . Flexible sigmoidoscopy N/A 10/05/2013    Procedure: FLEXIBLE  SIGMOIDOSCOPY;  Surgeon: [name], MD;  Location: [facility];  Service: Endoscopy;  Laterality: N/A;  . Proctoscopy N/A 02/21/2014    Procedure: RIGID PROCTOSCOPY;  Surgeon: Adin Hector, MD;  Location: [facility];  Service: General;  Laterality: N/A;  . Low anterior bowel resection  02/21/2014  . Diverting ileostomy  02/21/2014   Family History  Problem Relation Age of Onset  . Cancer Maternal Uncle     Throat Cancer  . Cancer Paternal Uncle     Colon Cancer   History  Substance Use Topics  . Smoking status: Former Smoker -- 1.00 packs/day for 40 years    Types: Cigarettes  . Smokeless tobacco: Not on file  . Alcohol Use: No    Review of Systems  Constitutional: Negative for fever, diaphoresis, appetite change, fatigue and unexpected weight change.  HENT: Negative for mouth sores.   Eyes: Negative for visual disturbance.  Respiratory: Negative for cough, chest tightness, shortness of breath and wheezing.   Cardiovascular: Negative for chest pain.  Gastrointestinal: Negative for nausea, vomiting, abdominal pain, diarrhea and constipation.  Endocrine: Negative for polydipsia, polyphagia and polyuria.  Genitourinary: Negative for dysuria, urgency, frequency and hematuria.  Musculoskeletal: Negative for back pain and neck stiffness.  Skin: Negative for rash.  Allergic/Immunologic: Negative for immunocompromised state.  Neurological: Negative for syncope, light-headedness and headaches.  Hematological: Does not bruise/bleed easily.  Psychiatric/Behavioral: Negative for sleep disturbance. The patient is not  nervous/anxious.       Allergies  Oxycodone  Home Medications   Prior to Admission medications   Medication Sig Start Date End Date Taking? Authorizing Provider  amoxicillin (AMOXIL) 875 MG tablet Take 875 mg by mouth 2 (two) times daily. 10 day course  Was interrupted by surgery. Took doses today. 02/18/14  Yes Historical Provider, MD  HYDROcodone-acetaminophen  (NORCO) 5-325 MG per tablet Take 1-2 tablets by mouth every 4 (four) hours as needed for moderate pain or severe pain. 02/21/14  Yes Adin Hector, MD  naproxen sodium (ANAPROX) 220 MG tablet Take 220 mg by mouth 2 (two) times daily with a meal.   Yes Historical Provider, MD   BP 120/74  Pulse 97  Temp(Src) 98.3 F (36.8 C) (Oral)  Resp 20  SpO2 93% Physical Exam  Nursing note and vitals reviewed. Constitutional: He appears well-developed and well-nourished. No distress.  Awake, alert, nontoxic appearance  HENT:  Head: Normocephalic and atraumatic.  Mouth/Throat: Oropharynx is clear and moist. No oropharyngeal exudate.  Eyes: Conjunctivae are normal. No scleral icterus.  Neck: Normal range of motion. Neck supple.  Cardiovascular: Normal rate, regular rhythm and intact distal pulses.   Pulmonary/Chest: Effort normal and breath sounds normal. No respiratory distress. He has no wheezes.  Abdominal: Soft. Bowel sounds are normal. He exhibits no mass. There is no tenderness. There is no rebound and no guarding.  Numerous well-healing surgical scars Non-erythematous ileostomy with stool excretion and small dehiscent at the 3:00 position; no drainage from the dehisced site  Musculoskeletal: Normal range of motion. He exhibits no edema.  Neurological: He is alert.  Speech is clear and goal oriented Moves extremities without ataxia  Skin: Skin is warm and dry. He is not diaphoretic.  Psychiatric: He has a normal mood and affect.    ED Course  Procedures (including critical care time) Labs Review Labs Reviewed - No data to display  Imaging Review No results found.   EKG Interpretation None      MDM   Final diagnoses:  Surgical wound dehiscence  Ileostomy in place   [name] presents with concerns after surgical wound dehisced.  No drainage from the site.  Patient is afebrile, nontoxic and nonseptic appearing.  The dehiscent occurred this evening.  12:47 AM Discussed with  Dr. Marcello Moores of [facility] [place] [facility]. She reports that the patient may reattach his ileostomy bag and followup with Dr. Johney Maine tomorrow. He is to call the office to discuss the situation with Dr. Johney Maine and they together will schedule a new appointment.  It has been determined that no acute conditions requiring further emergency intervention are present at this time. The patient/guardian have been advised of the diagnosis and plan. We have discussed signs and symptoms that warrant return to the ED, such as changes or worsening in symptoms or, nausea, vomiting, extending erythema.   Vital signs are stable at discharge.   BP 120/74  Pulse 97  Temp(Src) 98.3 F (36.8 C) (Oral)  Resp 20  SpO2 93%  Patient/guardian has voiced understanding and agreed to follow-up with the PCP or specialist.      Abigail Butts, PA-C 03/06/14 [phone]

## 2014-03-05 NOTE — ED Provider Notes (Signed)
Medical screening examination/treatment/procedure(s) were conducted as a shared visit with non-physician practitioner(s) and myself.  I personally evaluated the patient during the encounter.   EKG Interpretation None     Patient here after having part of the stoma pulled away from the skin to a cleaning. No bleeding noted. Will consult general surgery and likely have him see him in the morning  Leota Jacobsen, MD 03/05/14 2338

## 2014-03-06 ENCOUNTER — Ambulatory Visit (INDEPENDENT_AMBULATORY_CARE_PROVIDER_SITE_OTHER): Payer: Commercial Indemnity | Admitting: Surgery

## 2014-03-06 ENCOUNTER — Encounter (INDEPENDENT_AMBULATORY_CARE_PROVIDER_SITE_OTHER): Payer: Self-pay | Admitting: Surgery

## 2014-03-06 ENCOUNTER — Telehealth (INDEPENDENT_AMBULATORY_CARE_PROVIDER_SITE_OTHER): Payer: Self-pay

## 2014-03-06 VITALS — BP 130/70 | HR 110 | Temp 97.4°F | Ht 66.0 in | Wt 237.0 lb

## 2014-03-06 DIAGNOSIS — C20 Malignant neoplasm of rectum: Secondary | ICD-10-CM

## 2014-03-06 DIAGNOSIS — Z932 Ileostomy status: Secondary | ICD-10-CM

## 2014-03-06 NOTE — Discharge Instructions (Signed)
1. Medications: usual home medications 2. Treatment: rest, drink plenty of fluids, where your colostomy bag as usual, keep ileostomy clean 3. Follow Up: Please call Dr. Clyda Greener office tomorrow for further discussion of today's visit   Ileostomy Home Guide An ileostomy is an opening for stool to leave your body when a medical condition prevents it from leaving through the usual opening (rectum). During a surgery, a piece of small intestine (ileum) is brought through a hole in the abdominal wall. The new opening is called a stoma or ostomy. A bag or pouch fits over the stoma to catch stool and gas. Your stool may be liquid at first. Over time, it may become about the consistency of applesauce. CARING FOR YOUR STOMA Normally, the stoma looks a lot like the inside of your cheek: pink and moist. At first, it may be swollen, but this swelling will decrease within 6 weeks. Keep the skin around the stoma clean and dry. You can gently wash your stoma in the shower with a clean, soft washcloth. If you develop any skin irritation, your caregiver may give you a stoma powder or ointment to help heal the area. Do not use any products other than those specifically given to you by your caregiver.  Your stoma should not be uncomfortable. If you notice any stinging or burning, your pouch may be leaking, and the skin around your stoma may be coming into contact with stool. This can cause skin irritation. If you notice stinging, you should replace your pouch with a new one and discard the old one. OSTOMY POUCHES The pouch that fits over the ostomy can be made up of either 1 or 2 pieces. A one-piece pouch has a skin barrier piece and the pouch itself in one unit. A two-piece pouch has a skin barrier with a separate pouch that snaps on and off of the skin barrier. Either way, you should empty the pouch when it is only  to  full. Do not let more stool or gas build up. This could cause the pouch to leak. Some ostomy bags have  a built-in gas release valve. Ostomy deodorizer (5 drops) can be put into the pouch to prevent odor. Some people use an ostomy lubricant to help the stool slide out of the bag more easily and completely.  EMPTYING YOUR OSTOMY POUCH You may get lessons on how to empty your pouch from a wound-ostomy nurse before you leave the hospital. Here are the basic steps:  Wash your hands with soap and water.  Sit far back on the toilet.  Put pieces of toilet paper into the toilet water. This will prevent splashing as you empty the stool into the toilet bowl.  Unclip or unvelcro the tail end of the pouch.  Unroll the tail and empty stool into the toilet.  Clean the tail with toilet paper.  Reroll the tail, and clip or velcro it closed.  Wash your hands again. CHANGING YOUR OSTOMY POUCH Change your ostomy pouch about every 3 to 4 days for the first 6 weeks, then every 5 to 7 days. Always change the bag sooner if you begin to notice any discomfort or irritation of the skin around the stoma. When possible, plan to change your ostomy pouching system before eating and drinking because this will lessen the chance of stool coming out during the change. A wound-ostomy nurse may teach you how to change your pouch before you leave the hospital. Here are the basic steps:  Lay out  your supplies.  Wash your hands with soap and water.  Carefully remove the old pouch.  Wash the stoma and the skin around the stoma. Allow the area to dry. Men may be advised to shave any hair around the stoma very carefully. This will make the adhesive stick better.  Use the stoma measuring guide that comes with your pouch set to decide what size hole you will need to cut in the skin barrier piece. Choose the smallest possible size that will hold the stoma but will not touch it.  Use the guide to trace the circle on the back of the skin barrier piece. Cut out the hole.  Hold the skin barrier piece over the stoma to make sure the  hole is the correct size.  Remove the adhesive paper backing from the skin barrier piece.  Squeeze stoma paste around the opening of the skin barrier piece.  Clean and dry the skin around the stoma again.  Carefully fit the skin barrier piece over your stoma.  If you are using a two-piece pouch, snap the pouch onto the skin barrier piece.  Close the tail of the pouch.  Put your hand over the top of the skin barrier piece to help warm it for about 5 minutes, so that it conforms to your body better.  Wash your hands again. DIET TIPS Because you have a higher risk of a blockage in the first 2 months after getting an ileostomy, you should decrease your fiber intake for that time period. Fibrous foods include:  Celery.  Cabbage (and coleslaw).  Pineapple.  Mushrooms.  Corn and popcorn.  Whole fruits and vegetables, especially with the skins on.  Foods that contain seeds.  Oranges, grapefruit, tangerines, and other citrus fruit.  Nuts. After about 2 months, you can slowly add these kinds of foods back into your regular diet, as tolerated. Other tips:  Drink about eight 8 oz glasses of water each day.  You can prevent gas by eating slowly and chewing your food thoroughly.  If you feel concerned that you have too much gas, you can cut back on gas-producing foods, such as:  Spicy foods.  Onions and garlic.  Cruciferous vegetables (cabbage, broccoli, cauliflower, Brussels sprouts).  Beans and legumes.  Some cheeses.  Eggs.  Fish.  Bubbly (carbonated) drinks.  Chewing gum. GENERAL TIPS  You can shower with or without the bag in place.  Always keep the bag snapped on if you are bathing or swimming.  If your bag gets wet, you can dry it with a blow-dryer set to cool.  Avoid wearing tight clothing directly over your stoma so that it does not become irritated or bleed. Tight clothing can also prevent the stool from draining into the pouch, which can cause it  to leak.  It is helpful to always have an extra skin barrier and pouch with you when traveling. Do not leave them anywhere too warm, because parts of them can melt.  Do not let your seat belt rest on your stoma. Try to keep the seat belt either above or below your stoma, or use a tiny pillow to cushion it.  You can still participate in sports, but you should avoid activities in which there is a risk of getting hit in the abdomen.  You can still have sex. It is a good idea to empty your pouch prior to sex. Some people and their partners feel very comfortable seeing the pouch during sex. Others choose to wear  lingerie or a T-shirt that covers the device. SEEK IMMEDIATE MEDICAL CARE IF:  You feel dizzy, lightheaded, or faint.  You measure pouch drainage of more than 1,500 ml per day. This amount of drainage can lead to dehydration.  You notice a change in the size or color of the stoma, especially if it becomes very red, purple, black, or pale white.  You have bloody stools or bleeding from the ostomy.  You have abdominal pain, nausea, vomiting, or bloating.  There is anything unusual protruding from the ostomy.  You have irritation or red skin around the ostomy.  No stool is passing from the stoma.  You have diarrhea (requiring pouch emptying more frequently than normal). Document Released: 09/18/2003 Document Revised: 01/10/2013 Document Reviewed: 02/12/2011 [facility] Patient Information 2014 [place], [place].

## 2014-03-06 NOTE — Progress Notes (Signed)
Subjective:     Patient ID: Stephen Costa, male   DOB: 02-Jul-1956, 58 y.o.   MRN: [id number]  HPI  Note: This dictation was prepared with Dragon/digital dictation along with [facility] technology. Any transcriptional errors that result from this process are unintentional.       Stephen Costa  1955-11-30 [id number]  Patient Care Team: Lynne Logan, MD as PCP - General (Family Medicine) [name], MD as Consulting Physician (Gastroenterology) Adin Hector, MD as Consulting Physician (General Surgery) Ladell Pier, MD as Consulting Physician (Oncology) Marye Round, MD as Consulting Physician (Radiation Oncology) Carola Frost, RN as Registered Nurse  Procedure (Date: 02/21/2014):  POST-OPERATIVE DIAGNOSIS: RECTAL CANCER   PROCEDURE: Procedure(s):  ROBOTIC SPLENIC FLEXURE MOBILIZATION  LOW ANTERIOR RESECTION  DIVERTING LOOP ILEOSTOMY  RIGID PROCTOSCOPY   SURGEON: Surgeon(s):  Adin Hector, MD  Leighton Ruff, MD Asst  This patient returns for surgical re-evaluation.  POD# 13.  He comes to the office today with his wife.  She noticed that the ileostomy was pulling away from the skin with a wound.  She cannot get hold of an ostomy nurse last night.  Went to the emergency room.  He was eating well.  No fevers or chills.  No pus or drainage.  Energy level rather good.  They recommended close followup at our office.  He comes today, the next day.  He is emptying the ileostomy every 2-3 hours.  Output rather thickened.  Not needing any antidiarrheals.  Not lightheaded or dizzy.  Urinating fine.  No fevers chills or sweats.  No bleeding.  Not using narcotics.  No nausea or vomiting.  Appetite okay.  In good spirits overall.  He feels like he is recovering rather well so far  Patient Active Problem List   Diagnosis Date Noted  . 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 Rectal cancer ypT3ypN0 (0/16 LN) s/p LAR/ileostomy 02/21/2014 09/27/2013  . Umbilical hernia s/p repair  02/21/2014 09/27/2013  . Obesity (BMI 30-39.9) 09/27/2013  . Tobacco abuse 09/27/2013    Past Medical History  Diagnosis Date  . Rectal cancer 09/26/2013    ypT3ypN0 (0/16 LN)  . Sleep apnea     wears cpap most nights  . Hemorrhoids   . Cancer 10/05/13    rectum  . Allergy   . Arthritis     knees  . History of radiation therapy 10/31/13-12/08/13    rectal 50.4Gy total  . Rectal cancer     Past Surgical History  Procedure Laterality Date  . Lumbar disc surgery  1984, 1987  . Wrist surgery Right 2006  . Shoulder surgery Right 1999  . Surgery scrotal / testicular Left 58 y/o    respositioning into scrotum  . Eus N/A 10/05/2013    Procedure: LOWER ENDOSCOPIC ULTRASOUND (EUS);  Surgeon: [name], MD;  Location: [name];  Service: Endoscopy;  Laterality: N/A;  . Flexible sigmoidoscopy N/A 10/05/2013    Procedure: FLEXIBLE SIGMOIDOSCOPY;  Surgeon: [name], MD;  Location: [facility];  Service: Endoscopy;  Laterality: N/A;  . Proctoscopy N/A 02/21/2014    Procedure: RIGID PROCTOSCOPY;  Surgeon: Adin Hector, MD;  Location: [facility];  Service: General;  Laterality: N/A;  . Low anterior bowel resection  02/21/2014  . 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 Marital Status: Married    Spouse Name: N/A    Number of Children: N/A  . Years of Education: N/A  Occupational History  . 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 Smoking status: Former Smoker -- 1.00 packs/day for 40 years    Types: Cigarettes  . Smokeless tobacco: Not on file  . Alcohol Use: No  . Drug Use: No  . Sexual Activity: Not on file   Other Topics Concern  . Not on file   Social History Narrative  . No narrative on file    Family History  Problem Relation Age of Onset  . Cancer Maternal Uncle     Throat Cancer  . Cancer Paternal Uncle     Colon Cancer    Current Outpatient Prescriptions  Medication Sig Dispense Refill  .  amoxicillin (AMOXIL) 875 MG tablet Take 875 mg by mouth 2 (two) times daily. 10 day course  Was interrupted by surgery. Took doses today.      Marland Kitchen HYDROcodone-acetaminophen (NORCO) 5-325 MG per tablet Take 1-2 tablets by mouth every 4 (four) hours as needed for moderate pain or severe pain.  50 tablet  0  . naproxen sodium (ANAPROX) 220 MG tablet Take 220 mg by mouth 2 (two) times daily with a meal.       No current facility-administered medications for this visit.     Allergies  Allergen Reactions  . Oxycodone     MAKES PATIENT ANXIOUS & HYPER    BP 130/70  Pulse 110  Temp(Src) 97.4 F (36.3 C)  Ht 5\' 6"  (1.676 m)  Wt 237 lb (107.502 kg)  BMI 38.27 kg/m2  Dg Chest Portable 1 View  02/21/2014   CLINICAL DATA:  Hypoxia.  EXAM: PORTABLE CHEST - 1 VIEW  COMPARISON:  Chest CT 09/30/2013.  FINDINGS: Endotracheal tube and NG tube noted in good anatomic position. Right upper lobe atelectatic changes noted. No pleural effusion or pneumothorax. Lucencies are noted in the right humeral proximal metaphysis. Right humerus series suggested for further evaluation. No acute bony abnormality .  IMPRESSION: 1. Right upper lobe atelectatic changes. 2. Endotracheal tube and NG tube in good anatomic position. 3. Lucencies within the proximal metaphysis of the right humerus, right humerus series suggested for further evaluation.   Electronically Signed   By: Marcello Moores  Register   On: 02/21/2014 14:54     Review of Systems  Constitutional: Negative for fever, chills and diaphoresis.  HENT: Negative for sore throat and trouble swallowing.   Eyes: Negative for photophobia and visual disturbance.  Respiratory: Negative for choking and shortness of breath.   Cardiovascular: Negative for chest pain and palpitations.  Gastrointestinal: Negative for nausea, vomiting, abdominal pain, constipation, blood in stool, abdominal distention, anal bleeding and rectal pain.  Genitourinary: Negative for dysuria, urgency,  difficulty urinating and testicular pain.  Musculoskeletal: Negative for arthralgias, gait problem, myalgias and neck pain.  Skin: Positive for wound. Negative for color change and rash.  Neurological: Negative for dizziness, speech difficulty, weakness and numbness.  Hematological: Negative for adenopathy.  Psychiatric/Behavioral: Negative for hallucinations, confusion and agitation.       Objective:   Physical Exam  Constitutional: He is oriented to person, place, and time. He appears well-developed and well-nourished. No distress.  HENT:  Head: Normocephalic.  Mouth/Throat: Oropharynx is clear and moist. No oropharyngeal exudate.  Eyes: Conjunctivae and EOM are normal. Pupils are equal, round, and reactive to light. No scleral icterus.  Neck: Normal range of motion. No tracheal deviation present.  Cardiovascular: Normal rate, normal heart sounds and intact  distal pulses.   Pulmonary/Chest: Effort normal. No respiratory distress.  Abdominal: Soft. He exhibits no distension. There is no tenderness. No hernia. Hernia confirmed negative in the ventral area, confirmed negative in the right inguinal area and confirmed negative in the left inguinal area.    Incisions clean with normal healing ridges.  No hernias  Musculoskeletal: Normal range of motion. He exhibits no tenderness.  Neurological: He is alert and oriented to person, place, and time. No cranial nerve deficit. He exhibits normal muscle tone. Coordination normal.  Skin: Skin is warm and dry. No rash noted. He is not diaphoretic.  Old thin scabs from prior skin dressing blisters  Psychiatric: He has a normal mood and affect. His behavior is normal.       Assessment:     Recovering remarkably well from low anterior resection with diverting loop ileostomy.  Small separation on the medial corner of loop ileostomy.  Not severe.     Plan:     Continue ostomy care.  They felt a lot of support between the hospital and home  health.  Tons of information.    Follow up with  Cena Benton with [facility] for outpt ostomy consultation/troubleshooting on wound separation.  Perhaps benefit of stoma paste at the ileostomy wound but I would not be too aggressive with it.   Increase activity as tolerated to regular activity.  Low impact exercise such as walking an hour a day at least ideal.  Do not push through pain.  Diet as tolerated.  Low fat high fiber diet ideal.  Bowel regimen with 30 g fiber a day and fiber supplement as needed to avoid problems.  Return to clinic 3-4 weeks, sooner as needed.   Instructions discussed.  Followup with primary care physician for other health issues as would normally be done.  Consider screening for malignancies (breast, prostate, colon, melanoma, etc) as appropriate.  F/u w Medical Oncology to consider post-adjuvant Tx.  Questions answered.  The patient & his wife expressed understanding and appreciation

## 2014-03-06 NOTE — Patient Instructions (Signed)
Ostomy Support Information  Yes, you've heard that people get along just fine with only one of their eyes, or one of their lungs, or one of their kidneys. But you also know that you have only one intestine and only one bladder, and that leaves you feeling awfully empty, both physically and emotionally: You think no other people go around without part of their intestine with the ends of their intestines sticking out through their abdominal walls.  Well, you are wrong! There are nearly three quarters of a million people in the [place] who have an ostomy; people who have had surgery to remove all or part of their colons or bladders. There is even a national association, the [place] [employer] [place] with over 350 local affiliated support groups that are organized by volunteers who provide peer support and counseling. [place] has a toll free telephone num-ber, [phone] and an educational,  interactive website, www.ostomy.org   An ostomy is an opening in the belly (abdominal wall) made by surgery. Ostomates are people who have had this procedure. The opening (stoma) allows the kidney or bowel to discharge waste. An external pouch covers the stoma to collect waste. Pouches are are a simple bag and are odor free. Different companies have disposable or reusable pouches to fit one's lifestyle. An ostomy can either be temporary or permanent.  THERE ARE THREE MAIN TYPES OF OSTOMIES  Colostomy. A colostomy is a surgically created opening in the large intestine (colon).  Ileostomy. An ileostomy is a surgically created opening in the small intestine.  Urostomy. A urostomy is a surgically created opening to divert urine away from the bladder. FREQUENTLY ASKED QUESTIONS   Why haven't you met any of these folks who have an ostomy?  Well, maybe you have! You just did not recognize them because an ostomy doesn't show. It can be kept secret if you wish. Why, maybe some of your best friends, office associates  or neighbors have an ostomy ... you never can tell.   People facing ostomy surgery have many quality-of-life questions like:  Will you bulge? Smell? Make noises? Will you feel waste leaving your body? Will you be a captive of the toilet? Will you starve? Be a social outcast? Get/stay married? Have babies? Easily bathe, go swimming, bend over?  OK, let's look at what you can expect:  Will you bulge?  Remember, without part of the intestine or bladder, and its contents, you should have a flatter tummy than before. You can expect to wear, with little exception, what you wore before surgery ... and this in-cludes tight clothing and bathing suits.  Will you smell?  Today, thanks to modern odor proof pouching systems, you can walk into an ostomy support group meeting and not smell anything that is foul or offensive. And, for those with an ileostomy or colostomy who are concerned about odor when emptying their pouch, there are in-pouch deodorants that can be used to eliminate any waste odors that may exist.  Will you make noises?  Everyone produces gas, especially if they are an air-swallower. But intestinal sounds that occur from time to time are no differ-ent than a gurgling tummy, and quite often your clothing will muffle any sounds.   Will you feel the waste discharges?  For those with a colostomy or ileostomy there might be a slight pressure when waste leaves your body, but understand that the intestines have no nerve endings, so there will be no unpleasant sensations. Those with a urostomy will  probably be unaware of any kidney drainage.  Will you be a captive of the toilet?  Immediately post-op you will spend more time in the bathroom than you will after your body recovers from surgery. Every person is different, but on average those with an ileostomy or urostomy may empty their pouches 4 to 6 times a day; a little  less if you have a colostomy. The average wear time between pouch system changes is 3  to 5 days and the changing process should take less than 30 minutes.  Will I need to be on a special diet? Most people return to their normal diet when they have recovered from surgery. Be sure to chew your food well, eat a well-balanced diet and drink plenty of fluids. If you experience problems with a certain food, wait a couple of weeks and try it again. Will there be odor and noises? Pouching systems are designed to be odor-proof or odor-resistant. There are deodorants that can be used in the pouch. Medications are also available to help reduce odor. Limit gas-producing foods and carbonated beverages. You will experience less gas and fewer noises as you heal from surgery. How much time will it take to care for my ostomy? At first, you may spend a lot of time learning about your ostomy and how to take care of it. As you become more comfortable and skilled at changing the pouching system, it will take very little time to care for it.  Will I be able to return to work? People with ostomies can perform most jobs. As soon as you have healed from surgery, you should be able to return to work. Heavy lifting (more than 10 pounds) may be discouraged.  What about intimacy? Sexual relationships and intimacy are important and fulfilling aspects of your life. They should continue after ostomy surgery. Intimacy-related concerns should be discussed openly between you and your partner.  Can I wear regular clothing? You do not need to wear special clothing. Ostomy pouches are fairly flat and barely noticeable. Elastic undergarments will not hurt the stoma or prevent the ostomy from functioning.  Can I participate in sports? An ostomy should not limit your involvement in sports. Many people with ostomies are runners, skiers, swimmers or participate in other active lifestyles. Talk with your caregiver first before doing heavy physical activity.  Will you starve?  Not if you follow doctor's orders at each stage of  your post-op adjustment. There is no such thing as an "ostomy diet". Some people with an ostomy will be able to eat and tolerate anything; others may find diffi-culty with some foods. Each person is an individual and must determine, by trial, what is best for them. A good practice for all is to drink plenty of water.  Will you be a social outcast?  Have you met anyone who has an ostomy and is a social outcast? Why should you be the first? Only your attitude and self image will effect how you are treated. No confi-dent person is an Occupational psychologist.   PROFESSIONAL HELP  Resources are available if you need help or have questions about your ostomy.    Specially trained nurses called Wound, Ostomy Continence Nurses (WOCN) are available for consultation in most major medical centers.   Consider getting an ostomy consult with Cena Benton at [facility] to help troubleshoot stoma pouch fittings and other issues with your ostomy: [phone]   The [employer] (UOA) is a group made up of many  local chapters throughout the [place]. These local groups hold meetings and provide support to prospective and existing ostomates. They sponsor educational events and have qualified visitors to make personal or telephone visits. Contact the UOA for the chapter nearest you and for other educational publications.  More detailed information can be found in Colostomy Guide, a publication of the [employer] (UOA). Contact UOA at 1-219-542-6832 or visit their web site at [email or web address].[email or web address]/. The website contains links to other sites, suppliers and resources. Document Released: 09/18/2003 Document Revised: 12/08/2011 Document Reviewed: 01/17/2009 [facility] Patient Information 2013 [place].  GETTING TO GOOD BOWEL HEALTH. Irregular bowel habits such as constipation and diarrhea can lead to many problems over time.  Having one soft bowel movement a day is the most important way to  prevent further problems.  The anorectal canal is designed to handle stretching and feces to safely manage our ability to get rid of solid waste (feces, poop, stool) out of our body.  BUT, hard constipated stools can act like ripping concrete bricks and diarrhea can be a burning fire to this very sensitive area of our body, causing inflamed hemorrhoids, anal fissures, increasing risk is perirectal abscesses, abdominal pain/bloating, an making irritable bowel worse.     The goal: ONE SOFT BOWEL MOVEMENT A DAY!  To have soft, regular bowel movements:    Drink at least 8 tall glasses of water a day.     Take plenty of fiber.  Fiber is the undigested part of plant food that passes into the colon, acting s "natures broom" to encourage bowel motility and movement.  Fiber can absorb and hold large amounts of water. This results in a larger, bulkier stool, which is soft and easier to pass. Work gradually over several weeks up to 6 servings a day of fiber (25g a day even more if needed) in the form of: o Vegetables -- Root (potatoes, carrots, turnips), leafy green (lettuce, salad greens, celery, spinach), or cooked high residue (cabbage, broccoli, etc) o Fruit -- Fresh (unpeeled skin & pulp), Dried (prunes, apricots, cherries, etc ),  or stewed ( applesauce)  o Whole grain breads, pasta, etc (whole wheat)  o Bran cereals    Bulking Agents -- This type of water-retaining fiber generally is easily obtained each day by one of the following:  o Psyllium bran -- The psyllium plant is remarkable because its ground seeds can retain so much water. This product is available as Metamucil, Konsyl, Effersyllium, Per Diem Fiber, or the less expensive generic preparation in drug and health food stores. Although labeled a laxative, it really is not a laxative.  o Methylcellulose -- This is another fiber derived from wood which also retains water. It is available as Citrucel. o Polyethylene Glycol - and "artificial" fiber  commonly called Miralax or Glycolax.  It is helpful for people with gassy or bloated feelings with regular fiber o Flax Seed - a less gassy fiber than psyllium   No reading or other relaxing activity while on the toilet. If bowel movements take longer than 5 minutes, you are too constipated   AVOID CONSTIPATION.  High fiber and water intake usually takes care of this.  Sometimes a laxative is needed to stimulate more frequent bowel movements, but    Laxatives are not a good long-term solution as it can wear the colon out. o Osmotics (Milk of Magnesia, Fleets phosphosoda, Magnesium citrate, MiraLax, GoLytely) are safer than  o Stimulants (Senokot, Castor Oil, Dulcolax, Ex Lax)  o Do not take laxatives for more than 7days in a row.    IF SEVERELY CONSTIPATED, try a Bowel Retraining Program: o Do not use laxatives.  o Eat a diet high in roughage, such as bran cereals and leafy vegetables.  o Drink six (6) ounces of prune or apricot juice each morning.  o Eat two (2) large servings of stewed fruit each day.  o Take one (1) heaping tablespoon of a psyllium-based bulking agent twice a day. Use sugar-free sweetener when possible to avoid excessive calories.  o Eat a normal breakfast.  o Set aside 15 minutes after breakfast to sit on the toilet, but do not strain to have a bowel movement.  o If you do not have a bowel movement by the third day, use an enema and repeat the above steps.    Controlling diarrhea o Switch to liquids and simpler foods for a few days to avoid stressing your intestines further. o Avoid dairy products (especially milk & ice cream) for a short time.  The intestines often can lose the ability to digest lactose when stressed. o Avoid foods that cause gassiness or bloating.  Typical foods include beans and other legumes, cabbage, broccoli, and dairy foods.  Every person has some sensitivity to other foods, so listen to our body and avoid those foods that trigger problems for  you. o Adding fiber (Citrucel, Metamucil, psyllium, Miralax) gradually can help thicken stools by absorbing excess fluid and retrain the intestines to act more normally.  Slowly increase the dose over a few weeks.  Too much fiber too soon can backfire and cause cramping & bloating. o Probiotics (such as active yogurt, Align, etc) may help repopulate the intestines and colon with normal bacteria and calm down a sensitive digestive tract.  Most studies show it to be of mild help, though, and such products can be costly. o Medicines:   Bismuth subsalicylate (ex. Kayopectate, Pepto Bismol) every 30 minutes for up to 6 doses can help control diarrhea.  Avoid if pregnant.   Loperamide (Immodium) can slow down diarrhea.  Start with two tablets ($RemoveBefo'4mg'oSBaxuKuVTF$  total) first and then try one tablet every 6 hours.  Avoid if you are having fevers or severe pain.  If you are not better or start feeling worse, stop all medicines and call your doctor for advice o Call your doctor if you are getting worse or not better.  Sometimes further testing (cultures, endoscopy, X-ray studies, bloodwork, etc) may be needed to help diagnose and treat the cause of the diarrhea. o

## 2014-03-06 NOTE — ED Provider Notes (Signed)
Medical screening examination/treatment/procedure(s) were conducted as a shared visit with non-physician practitioner(s) and myself.  I personally evaluated the patient during the encounter.   EKG Interpretation None       Leota Jacobsen, MD 03/06/14 1000

## 2014-03-06 NOTE — Telephone Encounter (Signed)
Called to make an appt with Cena Benton @ [facility] for ostomy appliance. Appt made for today 03/06/14. The pt made aware at appt today with Dr Johney Maine.

## 2014-03-07 ENCOUNTER — Ambulatory Visit (HOSPITAL_BASED_OUTPATIENT_CLINIC_OR_DEPARTMENT_OTHER): Payer: Managed Care, Other (non HMO) | Admitting: Nurse Practitioner

## 2014-03-07 ENCOUNTER — Telehealth: Payer: Self-pay | Admitting: Oncology

## 2014-03-07 VITALS — BP 129/76 | HR 92 | Temp 97.2°F | Resp 20 | Ht 66.0 in | Wt 239.0 lb

## 2014-03-07 DIAGNOSIS — F17200 Nicotine dependence, unspecified, uncomplicated: Secondary | ICD-10-CM

## 2014-03-07 DIAGNOSIS — C19 Malignant neoplasm of rectosigmoid junction: Secondary | ICD-10-CM

## 2014-03-07 DIAGNOSIS — K5900 Constipation, unspecified: Secondary | ICD-10-CM

## 2014-03-07 DIAGNOSIS — K625 Hemorrhage of anus and rectum: Secondary | ICD-10-CM

## 2014-03-07 DIAGNOSIS — R911 Solitary pulmonary nodule: Secondary | ICD-10-CM

## 2014-03-07 DIAGNOSIS — R58 Hemorrhage, not elsewhere classified: Secondary | ICD-10-CM

## 2014-03-07 DIAGNOSIS — C20 Malignant neoplasm of rectum: Secondary | ICD-10-CM

## 2014-03-07 MED ORDER — HYDROCODONE-ACETAMINOPHEN 5-325 MG PO TABS: 1.0000 | ORAL_TABLET | ORAL | Status: DC | PRN

## 2014-03-07 NOTE — Progress Notes (Addendum)
  [facility] OFFICE PROGRESS NOTE   Diagnosis:  Rectal cancer.  INTERVAL HISTORY:   Stephen Costa returns as scheduled. He underwent a low anterior resection with diverting loop ileostomy by Dr. Johney Maine on 02/21/2014. He feels he is recovering well from surgery. He is emptying the ileostomy bag 6-8 times a day. The consistency varies from watery to pasty. Pain related to surgery is improving. He takes hydrocodone as needed. No nausea or vomiting.  He reports he has quit smoking.  Objective:  Vital signs in last 24 hours:  Blood pressure 129/76, pulse 92, temperature 97.2 F (36.2 C), temperature source Oral, resp. rate 20, height 5\' 6"  (1.676 m), weight 239 lb (108.41 kg).    HEENT: No thrush or ulcerations. Resp: Lungs clear. Cardio: Regular cardiac rhythm. GI: Abdomen is soft. No hepatomegaly. Right abdomen ileostomy. Liquid stool in the collection bag. Abdominal incision is healing. No erythema. Several scabs on the abdominal wall at previous tape sites. Vascular: No leg edema.     Lab Results:  Lab Results  Component Value Date   WBC 15.1* 02/22/2014   HGB 9.6* 02/24/2014   HCT 35.7* 02/22/2014   MCV 93.5 02/22/2014   PLT 294 02/22/2014   NEUTROABS 5.8 12/16/2013    Imaging:  No results found.  Medications: I have reviewed the patient's current medications.  Assessment/Plan: 1. 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  Initiation of radiation and Xeloda 10/31/2013; completed on 12/07/2013. Status post low anterior resection with diverting loop ileostomy 02/21/2014. Final pathology with microscopic foci of residual adenocarcinoma, largest focus 0.4 cm; extension focally beyond the muscularis propria, involving pericolonic fatty tissue; no lymph-vascular invasion; no perineural invasion; final resection margins negative. ypT3, ypN0, ypMX. 2. Indeterminate 3 mm right upper lobe nodule on staging chest CT 09/30/2013. 3. Rectal pain, constipation  and bleeding secondary to #1. Improved. 4. Tobacco use. He has quit smoking.  Disposition: Stephen Costa appears stable. He continues to recover from the recent surgery. Dr. Benay Spice reviewed the pathology report with Stephen Costa and recommends adjuvant chemotherapy with Xeloda for 5 cycles 14 days on/7 days off. We again reviewed potential toxicities associated with Xeloda including mouth sores, nausea, diarrhea, hand-foot syndrome, skin hyperpigmentation, skin rash, conjunctivitis. He is agreeable to proceed. He will begin the first cycle of adjuvant Xeloda on 03/14/2014. We will see him in followup on 03/27/2014. He understands to contact the office with diarrhea.  Patient seen with Dr. Benay Spice. 25 minutes were spent face-to-face at today's visit with the majority of that time involved in counseling/coordination of care.    Owens Shark ANP/GNP-BC   03/07/2014  1:09 PM This was a shared visit with Ned Card. I recommend adjuvant capecitabine. [name] agrees to proceed.  Julieanne Manson, M.D.

## 2014-03-07 NOTE — Telephone Encounter (Signed)
gv adn rpinted appt sched and avs fo ropt for June °

## 2014-03-08 ENCOUNTER — Other Ambulatory Visit: Payer: Self-pay | Admitting: *Deleted

## 2014-03-08 ENCOUNTER — Encounter: Payer: Self-pay | Admitting: Oncology

## 2014-03-08 ENCOUNTER — Telehealth: Payer: Self-pay | Admitting: Oncology

## 2014-03-08 DIAGNOSIS — C20 Malignant neoplasm of rectum: Secondary | ICD-10-CM

## 2014-03-08 MED ORDER — CAPECITABINE 500 MG PO TABS: 2000.0000 mg | ORAL_TABLET | Freq: Two times a day (BID) | ORAL | Status: DC

## 2014-03-08 NOTE — Telephone Encounter (Signed)
Called pt, informed him schedulers will contact him with a lab appt. Dr. Benay Spice wants labs before he starts Xeloda. He voiced understanding. Xeloda Rx given to [facility] in managed care.

## 2014-03-08 NOTE — Progress Notes (Signed)
Faxed xeloda prescription to [facility] °

## 2014-03-08 NOTE — Telephone Encounter (Signed)
added lab for 6/12 - s/w pt he is aware.

## 2014-03-10 ENCOUNTER — Other Ambulatory Visit (HOSPITAL_BASED_OUTPATIENT_CLINIC_OR_DEPARTMENT_OTHER): Payer: Managed Care, Other (non HMO)

## 2014-03-10 DIAGNOSIS — C20 Malignant neoplasm of rectum: Secondary | ICD-10-CM

## 2014-03-10 LAB — CBC & DIFF AND RETIC
BASO%: 0.9 % (ref 0.0–2.0)
BASOS ABS: 0.1 10*3/uL (ref 0.0–0.1)
EOS ABS: 0.4 10*3/uL (ref 0.0–0.5)
EOS%: 5.8 % (ref 0.0–7.0)
HCT: 36.2 % — ABNORMAL LOW (ref 38.4–49.9)
HEMOGLOBIN: 12.2 g/dL — AB (ref 13.0–17.1)
IMMATURE RETIC FRACT: 9 % (ref 3.00–10.60)
LYMPH#: 0.9 10*3/uL (ref 0.9–3.3)
LYMPH%: 13.8 % — ABNORMAL LOW (ref 14.0–49.0)
MCH: 31.8 pg (ref 27.2–33.4)
MCHC: 33.8 g/dL (ref 32.0–36.0)
MCV: 94.3 fL (ref 79.3–98.0)
MONO#: 0.8 10*3/uL (ref 0.1–0.9)
MONO%: 11.5 % (ref 0.0–14.0)
NEUT%: 68 % (ref 39.0–75.0)
NEUTROS ABS: 4.5 10*3/uL (ref 1.5–6.5)
Platelets: 486 10*3/uL — ABNORMAL HIGH (ref 140–400)
RBC: 3.84 10*6/uL — ABNORMAL LOW (ref 4.20–5.82)
RDW: 13.1 % (ref 11.0–14.6)
Retic %: 2.97 % — ABNORMAL HIGH (ref 0.80–1.80)
Retic Ct Abs: 114.05 10*3/uL — ABNORMAL HIGH (ref 34.80–93.90)
WBC: 6.7 10*3/uL (ref 4.0–10.3)

## 2014-03-14 NOTE — Progress Notes (Signed)
RECEIVED A FAX FROM [employer] CONCERNING A CONFIRMATION OF PRESCRIPTION SHIPMENT FOR CAPECITABINE ON 03/10/14.

## 2014-03-23 ENCOUNTER — Other Ambulatory Visit: Payer: Self-pay | Admitting: *Deleted

## 2014-03-23 DIAGNOSIS — C20 Malignant neoplasm of rectum: Secondary | ICD-10-CM

## 2014-03-23 NOTE — Telephone Encounter (Signed)
THIS REFILL REQUEST FOR CAPECITABINE WAS GIVEN TO DR.SHERRILL'S NURSE, AMY HORTON,RN. 

## 2014-03-27 ENCOUNTER — Other Ambulatory Visit (HOSPITAL_BASED_OUTPATIENT_CLINIC_OR_DEPARTMENT_OTHER): Payer: Managed Care, Other (non HMO)

## 2014-03-27 ENCOUNTER — Telehealth: Payer: Self-pay | Admitting: Oncology

## 2014-03-27 ENCOUNTER — Ambulatory Visit (HOSPITAL_BASED_OUTPATIENT_CLINIC_OR_DEPARTMENT_OTHER): Payer: Managed Care, Other (non HMO) | Admitting: Nurse Practitioner

## 2014-03-27 VITALS — BP 152/84 | HR 102 | Temp 97.5°F | Resp 20 | Ht 66.0 in | Wt 237.1 lb

## 2014-03-27 DIAGNOSIS — C20 Malignant neoplasm of rectum: Secondary | ICD-10-CM

## 2014-03-27 DIAGNOSIS — R197 Diarrhea, unspecified: Secondary | ICD-10-CM

## 2014-03-27 LAB — CBC WITH DIFFERENTIAL/PLATELET
BASO%: 0.7 % (ref 0.0–2.0)
BASOS ABS: 0 10*3/uL (ref 0.0–0.1)
EOS ABS: 0.2 10*3/uL (ref 0.0–0.5)
EOS%: 4.5 % (ref 0.0–7.0)
HCT: 40.6 % (ref 38.4–49.9)
HEMOGLOBIN: 13.7 g/dL (ref 13.0–17.1)
LYMPH#: 0.8 10*3/uL — AB (ref 0.9–3.3)
LYMPH%: 14.2 % (ref 14.0–49.0)
MCH: 31.4 pg (ref 27.2–33.4)
MCHC: 33.7 g/dL (ref 32.0–36.0)
MCV: 93.1 fL (ref 79.3–98.0)
MONO#: 0.5 10*3/uL (ref 0.1–0.9)
MONO%: 9.7 % (ref 0.0–14.0)
NEUT#: 3.9 10*3/uL (ref 1.5–6.5)
NEUT%: 70.9 % (ref 39.0–75.0)
Platelets: 431 10*3/uL — ABNORMAL HIGH (ref 140–400)
RBC: 4.36 10*6/uL (ref 4.20–5.82)
RDW: 14.1 % (ref 11.0–14.6)
WBC: 5.5 10*3/uL (ref 4.0–10.3)

## 2014-03-27 LAB — COMPREHENSIVE METABOLIC PANEL (CC13)
ALBUMIN: 3.6 g/dL (ref 3.5–5.0)
ALT: 33 U/L (ref 0–55)
AST: 23 U/L (ref 5–34)
Alkaline Phosphatase: 113 U/L (ref 40–150)
Anion Gap: 10 mEq/L (ref 3–11)
BUN: 12.2 mg/dL (ref 7.0–26.0)
CHLORIDE: 108 meq/L (ref 98–109)
CO2: 22 mEq/L (ref 22–29)
Calcium: 9.7 mg/dL (ref 8.4–10.4)
Creatinine: 0.8 mg/dL (ref 0.7–1.3)
GLUCOSE: 106 mg/dL (ref 70–140)
POTASSIUM: 4.2 meq/L (ref 3.5–5.1)
Sodium: 140 mEq/L (ref 136–145)
Total Bilirubin: 0.32 mg/dL (ref 0.20–1.20)
Total Protein: 7 g/dL (ref 6.4–8.3)

## 2014-03-27 MED ORDER — CAPECITABINE 500 MG PO TABS: 2000.0000 mg | ORAL_TABLET | Freq: Two times a day (BID) | ORAL | Status: DC

## 2014-03-27 NOTE — Addendum Note (Signed)
Addended by: Wyonia Hough on: 03/27/2014 10:33 AM   Modules accepted: Orders

## 2014-03-27 NOTE — Progress Notes (Addendum)
[facility] OFFICE PROGRESS NOTE   Diagnosis:  Rectal cancer.  INTERVAL HISTORY:   Stephen Costa returns as scheduled. He began cycle 1 adjuvant Xeloda on 03/14/2014. He has mild intermittent nausea. No mouth sores. He is emptying the ileostomy bag multiple times a day and several times during the night. The consistency varies from thick to  watery. He is trying to manage the output with dietary changes. He noted numbness/tingling in both hands right greater than left following the rectal surgery. The symptoms subsequently improved. Since beginning Xeloda he has noted increased numbness/tingling and pain in the right hand and lower arm. He denies neck pain. He continues to note drainage from the lower portion of the midline wound. He denies fever. His wife has noted skin irritation around the ostomy.  He intermittently notes lightheadedness when he is driving. He is no longer taking pain medication.  Objective:  Vital signs in last 24 hours:  Blood pressure 152/84, pulse 102, temperature 97.5 F (36.4 C), temperature source Oral, resp. rate 20, height 5\' 6"  (1.676 m), weight 237 lb 1.6 oz (107.548 kg).    HEENT: No thrush or ulcerations. Resp: Lungs clear. Cardio: Regular cardiac rhythm. GI: Abdomen is soft. No hepatomegaly. Small superficial opening lower portion of abdominal midline incision. Surrounding induration and tenderness. No erythema. Vascular: No leg edema. Neuro: Vibratory sense intact over the fingertips per tuning fork exam.  Skin: Palms without erythema.    Lab Results:  Lab Results  Component Value Date   WBC 5.5 03/27/2014   HGB 13.7 03/27/2014   HCT 40.6 03/27/2014   MCV 93.1 03/27/2014   PLT 431* 03/27/2014   NEUTROABS 3.9 03/27/2014    Imaging:  No results found.  Medications: I have reviewed the patient's current medications.  Assessment/Plan: 1. 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  Initiation of radiation and Xeloda  10/31/2013; completed on 12/07/2013.  Status post low anterior resection with diverting loop ileostomy 02/21/2014. Final pathology with microscopic foci of residual adenocarcinoma, largest focus 0.4 cm; extension focally beyond the muscularis propria, involving pericolonic fatty tissue; no lymph-vascular invasion; no perineural invasion; final resection margins negative.  Pathologic stage II (ypT3, ypN0, ypMX). Cycle 1 adjuvant Xeloda 03/14/2014. 2. Indeterminate 3 mm right upper lobe nodule on staging chest CT 09/30/2013. 3. Rectal pain, constipation and bleeding secondary to #1. Improved. 4. Tobacco use. He has quit smoking. 5. 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   Disposition: Stephen Costa appears stable. He is completing cycle 1 adjuvant Xeloda. He will begin cycle 2 on 04/04/2014.  He is having diarrhea. We discussed initiation of Imodium which he is agreeable to. He will push fluids. He understands to contact the office with persistent diarrhea.  He will followup with Dr. Johney Maine regarding the drainage from the lower portion of the midline incision. Question hematoma.  Dr. Benay Spice does not feel the numbness/tingling in the hands is related to Xeloda. If it persists we will consider a referral to neurology.  He will meet with the ostomy nurse later today regarding the skin irritation around the ostomy.  We will see him in followup on 04/18/2014. He will contact the office in the interim as outlined above or with any other problems. With regard to the intermittent lightheadedness while driving we instructed him that he should not be driving as long as he is experiencing this.  Patient seen with Dr. Benay Spice. 25 minutes were spent face-to-face at today's visit with the majority of  the time involved in counseling/coordination of care.    Ned Card ANP/GNP-BC   03/27/2014  12:47 PM  This was a shared visit with Ned Card. Stephen Costa was interviewed and examined. He will see Dr.  Johney Maine this week to evaluate the abdominal wound. He appears to have a hematoma/seroma at the lower aspect of the wound that has not completely drained.  I doubt the neuropathy symptoms in the right hand are related to Xeloda. He will discuss the numbness with Dr. Johney Maine since this began following surgery. We will need to consider a neurology referral or neck imaging if this persists.  Julieanne Manson, M.D.

## 2014-03-27 NOTE — Telephone Encounter (Signed)
Gave pt appt for lab and Md emailed [place] for July 2015

## 2014-03-29 ENCOUNTER — Encounter (INDEPENDENT_AMBULATORY_CARE_PROVIDER_SITE_OTHER): Payer: Self-pay | Admitting: Surgery

## 2014-03-29 ENCOUNTER — Ambulatory Visit (INDEPENDENT_AMBULATORY_CARE_PROVIDER_SITE_OTHER): Payer: Commercial Indemnity | Admitting: Surgery

## 2014-03-29 VITALS — BP 120/80 | HR 105 | Temp 97.8°F | Resp 20 | Ht 66.5 in | Wt 236.0 lb

## 2014-03-29 DIAGNOSIS — R202 Paresthesia of skin: Secondary | ICD-10-CM

## 2014-03-29 DIAGNOSIS — C20 Malignant neoplasm of rectum: Secondary | ICD-10-CM

## 2014-03-29 DIAGNOSIS — K429 Umbilical hernia without obstruction or gangrene: Secondary | ICD-10-CM

## 2014-03-29 DIAGNOSIS — R209 Unspecified disturbances of skin sensation: Secondary | ICD-10-CM

## 2014-03-29 DIAGNOSIS — Z932 Ileostomy status: Secondary | ICD-10-CM

## 2014-03-29 MED ORDER — GABAPENTIN 300 MG PO CAPS: 300.0000 mg | ORAL_CAPSULE | Freq: Two times a day (BID) | ORAL | Status: DC

## 2014-03-29 MED ORDER — AMOXICILLIN-POT CLAVULANATE 875-125 MG PO TABS: 1.0000 | ORAL_TABLET | Freq: Two times a day (BID) | ORAL | Status: DC

## 2014-03-29 NOTE — Patient Instructions (Signed)
Please consider the recommendations that we have given you today:  Take amoxicillin antibiotic for possible cellulitis on the lower part of incision.  Consider using gabapentin for intermittent numbness of right wrist.  2 right wrist exercises.  Naproxen and heat as well.  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  Call us when you are close to finishing so we can schedule it  See the Handout(s) we have given you.  Please call our office at [phone] if you wish to schedule surgery or if you have further questions / concerns.   Cellulitis Cellulitis is an infection of the skin and the tissue beneath it. The infected area is usually red and tender. Cellulitis occurs most often in the arms and lower legs.  CAUSES  Cellulitis is caused by bacteria that enter the skin through cracks or cuts in the skin. The most common types of bacteria that cause cellulitis are Staphylococcus and Streptococcus. SYMPTOMS   Redness and warmth.  Swelling.  Tenderness or pain.  Fever. DIAGNOSIS  Your caregiver can usually determine what is wrong based on a physical exam. Blood tests may also be done. TREATMENT  Treatment usually involves taking an antibiotic medicine. HOME CARE INSTRUCTIONS   Take your antibiotics as directed. Finish them even if you start to feel better.  Keep the infected arm or leg elevated to reduce swelling.  Apply a warm cloth to the affected area up to 4 times per day to relieve pain.  Only take over-the-counter or prescription medicines for pain, discomfort, or fever as directed by your caregiver.  Keep all follow-up appointments as directed by your caregiver. SEEK MEDICAL CARE IF:   You notice red streaks coming from the infected area.  Your red area gets larger or turns dark in color.  Your bone or joint underneath the infected area becomes painful after the skin has healed.  Your infection returns in the same area or another  area.  You notice a swollen bump in the infected area.  You develop new symptoms. SEEK IMMEDIATE MEDICAL CARE IF:   You have a fever.  You feel very sleepy.  You develop vomiting or diarrhea.  You have a general ill feeling (malaise) with muscle aches and pains. MAKE SURE YOU:   Understand these instructions.  Will watch your condition.  Will get help right away if you are not doing well or get worse. Document Released: 06/25/2005 Document Revised: 03/16/2012 Document Reviewed: 12/01/2011 [facility] Patient Information 2015 [place], [place]. This information is not intended to replace advice given to you by your health care provider. Make sure you discuss any questions you have with your health care provider.  Ostomy Support Information  Yes, you've heard that people get along just fine with only one of their eyes, or one of their lungs, or one of their kidneys. But you also know that you have only one intestine and only one bladder, and that leaves you feeling awfully empty, both physically and emotionally: You think no other people go around without part of their intestine with the ends of their intestines sticking out through their abdominal walls.  Well, you are wrong! There are nearly three quarters of a million people in the [place] who have an ostomy; people who have had surgery to remove all or part of their colons or bladders. There is even a national association, the [place] [employer] [place] with over 350 local affiliated support groups that are organized by volunteers who provide  peer support and counseling. [employer] has a toll free telephone num-ber, ([phone] and an educational,  interactive website, www.ostomy.org   An ostomy is an opening in the belly (abdominal wall) made by surgery. Ostomates are people who have had this procedure. The opening (stoma) allows the kidney or bowel to discharge waste. An external pouch covers the stoma to collect waste. Pouches are are  a simple bag and are odor free. Different companies have disposable or reusable pouches to fit one's lifestyle. An ostomy can either be temporary or permanent.  THERE ARE THREE MAIN TYPES OF OSTOMIES  Colostomy. A colostomy is a surgically created opening in the large intestine (colon).  Ileostomy. An ileostomy is a surgically created opening in the small intestine.  Urostomy. A urostomy is a surgically created opening to divert urine away from the bladder. FREQUENTLY ASKED QUESTIONS   Why haven't you met any of these folks who have an ostomy?  Well, maybe you have! You just did not recognize them because an ostomy doesn't show. It can be kept secret if you wish. Why, maybe some of your best friends, office associates or neighbors have an ostomy ... you never can tell.   People facing ostomy surgery have many quality-of-life questions like:  Will you bulge? Smell? Make noises? Will you feel waste leaving your body? Will you be a captive of the toilet? Will you starve? Be a social outcast? Get/stay married? Have babies? Easily bathe, go swimming, bend over?  OK, let's look at what you can expect:  Will you bulge?  Remember, without part of the intestine or bladder, and its contents, you should have a flatter tummy than before. You can expect to wear, with little exception, what you wore before surgery ... and this in-cludes tight clothing and bathing suits.  Will you smell?  Today, thanks to modern odor proof pouching systems, you can walk into an ostomy support group meeting and not smell anything that is foul or offensive. And, for those with an ileostomy or colostomy who are concerned about odor when emptying their pouch, there are in-pouch deodorants that can be used to eliminate any waste odors that may exist.  Will you make noises?  Everyone produces gas, especially if they are an air-swallower. But intestinal sounds that occur from time to time are no differ-ent than a gurgling tummy, and  quite often your clothing will muffle any sounds.   Will you feel the waste discharges?  For those with a colostomy or ileostomy there might be a slight pressure when waste leaves your body, but understand that the intestines have no nerve endings, so there will be no unpleasant sensations. Those with a urostomy will probably be unaware of any kidney drainage.  Will you be a captive of the toilet?  Immediately post-op you will spend more time in the bathroom than you will after your body recovers from surgery. Every person is different, but on average those with an ileostomy or urostomy may empty their pouches 4 to 6 times a day; a little  less if you have a colostomy. The average wear time between pouch system changes is 3 to 5 days and the changing process should take less than 30 minutes.  Will I need to be on a special diet? Most people return to their normal diet when they have recovered from surgery. Be sure to chew your food well, eat a well-balanced diet and drink plenty of fluids. If you experience problems with a certain food, wait  a couple of weeks and try it again. Will there be odor and noises? Pouching systems are designed to be odor-proof or odor-resistant. There are deodorants that can be used in the pouch. Medications are also available to help reduce odor. Limit gas-producing foods and carbonated beverages. You will experience less gas and fewer noises as you heal from surgery. How much time will it take to care for my ostomy? At first, you may spend a lot of time learning about your ostomy and how to take care of it. As you become more comfortable and skilled at changing the pouching system, it will take very little time to care for it.  Will I be able to return to work? People with ostomies can perform most jobs. As soon as you have healed from surgery, you should be able to return to work. Heavy lifting (more than 10 pounds) may be discouraged.  What about intimacy? Sexual  relationships and intimacy are important and fulfilling aspects of your life. They should continue after ostomy surgery. Intimacy-related concerns should be discussed openly between you and your partner.  Can I wear regular clothing? You do not need to wear special clothing. Ostomy pouches are fairly flat and barely noticeable. Elastic undergarments will not hurt the stoma or prevent the ostomy from functioning.  Can I participate in sports? An ostomy should not limit your involvement in sports. Many people with ostomies are runners, skiers, swimmers or participate in other active lifestyles. Talk with your caregiver first before doing heavy physical activity.  Will you starve?  Not if you follow doctor's orders at each stage of your post-op adjustment. There is no such thing as an "ostomy diet". Some people with an ostomy will be able to eat and tolerate anything; others may find diffi-culty with some foods. Each person is an individual and must determine, by trial, what is best for them. A good practice for all is to drink plenty of water.  Will you be a social outcast?  Have you met anyone who has an ostomy and is a social outcast? Why should you be the first? Only your attitude and self image will effect how you are treated. No confi-dent person is an Occupational psychologist.   PROFESSIONAL HELP  Resources are available if you need help or have questions about your ostomy.    Specially trained nurses called Wound, Ostomy Continence Nurses (WOCN) are available for consultation in most major medical centers.   Consider getting an ostomy consult with Cena Benton at [facility] to help troubleshoot stoma pouch fittings and other issues with your ostomy: [phone]   The [employer] Ostomy Association (UOA) is a group made up of many local chapters throughout the [place]. These local groups hold meetings and provide support to prospective and existing ostomates. They sponsor educational events  and have qualified visitors to make personal or telephone visits. Contact the UOA for the chapter nearest you and for other educational publications.  More detailed information can be found in Colostomy Guide, a publication of the [employer] (UOA). Contact UOA at 1-604-815-2552 or visit their web site at https://arellano.com/. The website contains links to other sites, suppliers and resources. Document Released: 09/18/2003 Document Revised: 12/08/2011 Document Reviewed: 01/17/2009 [facility] Patient Information 2013 [place].  GETTING TO GOOD BOWEL HEALTH. Irregular bowel habits such as constipation and diarrhea can lead to many problems over time.  Having one soft bowel movement a day is the most important way to prevent further problems.  The anorectal canal is designed to handle stretching and feces to safely manage our ability to get rid of solid waste (feces, poop, stool) out of our body.  BUT, hard constipated stools can act like ripping concrete bricks and diarrhea can be a burning fire to this very sensitive area of our body, causing inflamed hemorrhoids, anal fissures, increasing risk is perirectal abscesses, abdominal pain/bloating, an making irritable bowel worse.     The goal: ONE SOFT BOWEL MOVEMENT A DAY!  To have soft, regular bowel movements:    Drink at least 8 tall glasses of water a day.     Take plenty of fiber.  Fiber is the undigested part of plant food that passes into the colon, acting s "natures broom" to encourage bowel motility and movement.  Fiber can absorb and hold large amounts of water. This results in a larger, bulkier stool, which is soft and easier to pass. Work gradually over several weeks up to 6 servings a day of fiber (25g a day even more if needed) in the form of: o Vegetables -- Root (potatoes, carrots, turnips), leafy green (lettuce, salad greens, celery, spinach), or cooked high residue (cabbage, broccoli, etc) o Fruit -- Fresh (unpeeled skin & pulp),  Dried (prunes, apricots, cherries, etc ),  or stewed ( applesauce)  o Whole grain breads, pasta, etc (whole wheat)  o Bran cereals    Bulking Agents -- This type of water-retaining fiber generally is easily obtained each day by one of the following:  o Psyllium bran -- The psyllium plant is remarkable because its ground seeds can retain so much water. This product is available as Metamucil, Konsyl, Effersyllium, Per Diem Fiber, or the less expensive generic preparation in drug and health food stores. Although labeled a laxative, it really is not a laxative.  o Methylcellulose -- This is another fiber derived from wood which also retains water. It is available as Citrucel. o Polyethylene Glycol - and "artificial" fiber commonly called Miralax or Glycolax.  It is helpful for people with gassy or bloated feelings with regular fiber o Flax Seed - a less gassy fiber than psyllium   No reading or other relaxing activity while on the toilet. If bowel movements take longer than 5 minutes, you are too constipated   AVOID CONSTIPATION.  High fiber and water intake usually takes care of this.  Sometimes a laxative is needed to stimulate more frequent bowel movements, but    Laxatives are not a good long-term solution as it can wear the colon out. o Osmotics (Milk of Magnesia, Fleets phosphosoda, Magnesium citrate, MiraLax, GoLytely) are safer than  o Stimulants (Senokot, Castor Oil, Dulcolax, Ex Lax)    o Do not take laxatives for more than 7days in a row.    IF SEVERELY CONSTIPATED, try a Bowel Retraining Program: o Do not use laxatives.  o Eat a diet high in roughage, such as bran cereals and leafy vegetables.  o Drink six (6) ounces of prune or apricot juice each morning.  o Eat two (2) large servings of stewed fruit each day.  o Take one (1) heaping tablespoon of a psyllium-based bulking agent twice a day. Use sugar-free sweetener when possible to avoid excessive calories.  o Eat a normal breakfast.   o Set aside 15 minutes after breakfast to sit on the toilet, but do not strain to have a bowel movement.  o If you do not have a bowel movement by the third day, use an enema  and repeat the above steps.    Controlling diarrhea o Switch to liquids and simpler foods for a few days to avoid stressing your intestines further. o Avoid dairy products (especially milk & ice cream) for a short time.  The intestines often can lose the ability to digest lactose when stressed. o Avoid foods that cause gassiness or bloating.  Typical foods include beans and other legumes, cabbage, broccoli, and dairy foods.  Every person has some sensitivity to other foods, so listen to our body and avoid those foods that trigger problems for you. o Adding fiber (Citrucel, Metamucil, psyllium, Miralax) gradually can help thicken stools by absorbing excess fluid and retrain the intestines to act more normally.  Slowly increase the dose over a few weeks.  Too much fiber too soon can backfire and cause cramping & bloating. o Probiotics (such as active yogurt, Align, etc) may help repopulate the intestines and colon with normal bacteria and calm down a sensitive digestive tract.  Most studies show it to be of mild help, though, and such products can be costly. o Medicines:   Bismuth subsalicylate (ex. Kayopectate, Pepto Bismol) every 30 minutes for up to 6 doses can help control diarrhea.  Avoid if pregnant.   Loperamide (Immodium) can slow down diarrhea.  Start with two tablets (23m total) first and then try one tablet every 6 hours.  Avoid if you are having fevers or severe pain.  If you are not better or start feeling worse, stop all medicines and call your doctor for advice o Call your doctor if you are getting worse or not better.  Sometimes further testing (cultures, endoscopy, X-ray studies, bloodwork, etc) may be needed to help diagnose and treat the cause of the diarrhea.  Wrist Exercises RANGE OF MOTION (ROM) AND  STRETCHING EXERCISES - Wrist Sprain  These exercises may help you when beginning to rehabilitate your injury. Your symptoms may resolve with or without further involvement from your physician, physical therapist or athletic trainer. While completing these exercises, remember:   Restoring tissue flexibility helps normal motion to return to the joints. This allows healthier, less painful movement and activity.  An effective stretch should be held for at least 30 seconds.  A stretch should never be painful. You should only feel a gentle lengthening or release in the stretched tissue. RANGE OF MOTION - Wrist Flexion, Active-Assisted  Extend your right / left elbow with your palm pointing down.*  Gently pull the back of your hand towards you until you feel a gentle stretch on the top of your forearm.  Hold this position for ____5______ seconds. Repeat _______10___ times. Complete this exercise ___3______ times per day.  *If directed by your physician, physical therapist or athletic trainer, complete this stretch with your elbow bent rather than extended. RANGE OF MOTION - Wrist Extension, Active-Assisted   Extend your right / left elbow and turn your palm upwards.*  Gently pull your palm/fingertips back so your wrist extends and your fingers point more toward the ground.  You should feel a gentle stretch on the inside of your forearm.  Hold this position for _____5_____ seconds. Repeat _____10_____ times. Complete this exercise _____3_____ times per day. *If directed by your physician, physical therapist or athletic trainer, complete this stretch with your elbow bent, rather than extended. RANGE OF MOTION - Supination, Active   Stand or sit with your elbows at your side. Bend your right / left elbow to 90 degrees.  Turn your palm upward until you  feel a gentle stretch on the inside of your forearm.  Hold this position for __________ seconds. Slowly release and return to the starting  position. Repeat __________ times. Complete this stretch __________ times per day.  RANGE OF MOTION - Pronation, Active   Stand or sit with your elbows at your side. Bend your right / left elbow to 90 degrees.  Turn your palm downward until you feel a gentle stretch on the top of your forearm.  Hold this position for ____5______ seconds. Slowly release and return to the starting position. Repeat ____10______ times. Complete this stretch ______3____ times per day.  STRENGTHENING EXERCISES  These exercises may help you when beginning to rehabilitate your injury. They may resolve your symptoms with or without further involvement from your physician, physical therapist or athletic trainer. While completing these exercises, remember:   Muscles can gain both the endurance and the strength needed for everyday activities through controlled exercises.  Complete these exercises as instructed by your physician, physical therapist or athletic trainer. Progress the resistance and repetitions only as guided.  You may experience muscle soreness or fatigue, but the pain or discomfort you are trying to eliminate should never worsen during these exercises. If this pain does worsen, stop and make certain you are following the directions exactly. If the pain is still present after adjustments, discontinue the exercise until you can discuss the trouble with your clinician. STRENGTH - Wrist Flexors  Sit with your right / left forearm palm-up and fully supported. Your elbow should be resting below the height of your shoulder. Allow your wrist to extend over the edge of the surface.  Loosely holding a __________ weight or a piece of rubber exercise band/tubing, slowly curl your hand up toward your forearm.  Hold this position for __________ seconds. Slowly lower the wrist back to the starting position in a controlled manner. Repeat __________ times. Complete this exercise __________ times per day.  STRENGTH -  Wrist Extensors  Sit with your right / left forearm palm-down and fully supported. Your elbow should be resting below the height of your shoulder. Allow your wrist to extend over the edge of the surface.  Loosely holding a __________ weight or a piece of rubber exercise band/tubing, slowly curl your handup toward your forearm.  Hold this position for __________ seconds. Slowly lower the wrist back to the starting position in a controlled manner. Repeat __________ times. Complete this exercise __________ times per day.  STRENGTH - Forearm Supinators  Sit with your right / left forearm supported on a table, keeping your elbow below shoulder height. Rest your hand over the edge, palm down.  Gently grip a hammer or a soup ladle.  Without moving your elbow, slowly turn your palm and hand upward to a "thumbs-up" position.  Hold this position for __________ seconds. Slowly return to the starting position. Repeat __________ times. Complete this exercise __________ times per day.  STRENGTH - Forearm Pronators   Sit with your right / left forearm supported on a table, keeping your elbow below shoulder height. Rest your hand over the edge, palm up.  Gently grip a hammer or a soup ladle.  Without moving your elbow, slowly turn your palm and hand upward to a "thumbs-up" position.  Hold this position for __________ seconds. Slowly return to the starting position. Repeat __________ times. Complete this exercise __________ times per day.  STRENGTH - Grip  Grasp a tennis ball, a dense sponge, or a large, rolled sock in your hand.  Squeeze as hard as you can without increasing any pain.  Hold this position for ____5______ seconds. Release your grip slowly. Repeat ______10____ times. Complete this exercise _____3_____ times per day.  Document Released: 07/30/2005 Document Revised: 12/08/2011 Document Reviewed: 12/28/2008 [facility] Patient Information 2015 [place], [place]. This information is not  intended to replace advice given to you by your health care provider. Make sure you discuss any questions you have with your health care provider.

## 2014-03-29 NOTE — Progress Notes (Signed)
Subjective:     Patient ID: Stephen Costa, male   DOB: 09-20-1956, 58 y.o.   MRN: [id number]  HPI   Note: This dictation was prepared with Dragon/digital dictation along with [facility] technology. Any transcriptional errors that result from this process are unintentional.       Stephen Costa  1956-08-14 [id number]  Patient Care Team: Lynne Logan, MD as PCP - General (Family Medicine) [name], MD as Consulting Physician (Gastroenterology) Adin Hector, MD as Consulting Physician (General Surgery) Ladell Pier, MD as Consulting Physician (Oncology) Marye Round, MD as Consulting Physician (Radiation Oncology) Carola Frost, RN as Registered Nurse  Procedure (Date: 02/21/2014):  POST-OPERATIVE DIAGNOSIS: RECTAL CANCER   PROCEDURE: Procedure(s):  ROBOTIC SPLENIC FLEXURE MOBILIZATION  LOW ANTERIOR RESECTION  DIVERTING LOOP ILEOSTOMY  RIGID PROCTOSCOPY   SURGEON: Surgeon(s):  Adin Hector, MD  Leighton Ruff, MD Asst  This patient returns for surgical re-evaluation.  The ileostomy was pulling away from the skin with a wound.  WOCN helped manage - healing now.  Home health nurse had difficulty placing a wafer on correctly and he developed a bad rash.  Went back to divert medical supply.  Different wafer placed.  That is healing.  He was eating well.  No fevers or chills.  No pus or drainage.  Energy level rather good.  They recommended close followup at our office.  He comes today, the next day.  He is emptying the ileostomy every 1-3 hours.  Output rather thickened.  Not using any antidiarrheals.  Trying to use tapioca pudding and rice to thicken things up.  Not lightheaded or dizzy.  Urinating fine.  No fevers chills or sweats.  No bleeding.  Not using narcotics.  No nausea or vomiting.  Appetite okay.  In good spirits overall.  He feels like he is recovering rather well so far.  He is starting back on the oral Xeloda chemotherapy.  He did note that his right  wrist has become a little more numb along the thumb and fingers.  Handgrip okay.  Concerned him.  Also with some soreness in the lower part of the incision with occasional drainage.   Patient Active Problem List   Diagnosis Date Noted  . 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 Rectal cancer ypT3ypN0 (0/16 LN) s/p LAR/ileostomy 02/21/2014 09/27/2013  . Umbilical hernia s/p repair 02/21/2014 09/27/2013  . Obesity (BMI 30-39.9) 09/27/2013  . Tobacco abuse 09/27/2013    Past Medical History  Diagnosis Date  . Rectal cancer 09/26/2013    ypT3ypN0 (0/16 LN)  . Sleep apnea     wears cpap most nights  . Hemorrhoids   . Cancer 10/05/13    rectum  . Allergy   . Arthritis     knees  . History of radiation therapy 10/31/13-12/08/13    rectal 50.4Gy total  . Rectal cancer     Past Surgical History  Procedure Laterality Date  . Lumbar disc surgery  1984, 1987  . Wrist surgery Right 2006  . Shoulder surgery Right 1999  . Surgery scrotal / testicular Left 58 y/o    respositioning into scrotum  . Eus N/A 10/05/2013    Procedure: LOWER ENDOSCOPIC ULTRASOUND (EUS);  Surgeon: [name], MD;  Location: [name];  Service: Endoscopy;  Laterality: N/A;  . Flexible sigmoidoscopy N/A 10/05/2013    Procedure: FLEXIBLE SIGMOIDOSCOPY;  Surgeon: [name], MD;  Location: [facility];  Service: Endoscopy;  Laterality: N/A;  . Proctoscopy N/A 02/21/2014  Procedure: RIGID PROCTOSCOPY;  Surgeon: Adin Hector, MD;  Location: [facility];  Service: General;  Laterality: N/A;  . Low anterior bowel resection  02/21/2014  . 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 Marital Status: Married    Spouse Name: N/A    Number of Children: N/A  . 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 Smoking status: Former Smoker -- 1.00 packs/day for 40 years    Types: Cigarettes  . Smokeless tobacco: Not on file  .  Alcohol Use: No  . Drug Use: No  . Sexual Activity: Not on file   Other Topics Concern  . Not on file   Social History Narrative  . No narrative on file    Family History  Problem Relation Age of Onset  . Cancer Maternal Uncle     Throat Cancer  . Cancer Paternal Uncle     Colon Cancer    Current Outpatient Prescriptions  Medication Sig Dispense Refill  . capecitabine (XELODA) 500 MG tablet Take 4 tablets (2,000 mg total) by mouth 2 (two) times daily after a meal. Then 7 days off.  112 tablet  0  . naproxen sodium (ANAPROX) 220 MG tablet Take 220 mg by mouth 2 (two) times daily with a meal.       No current facility-administered medications for this visit.     Allergies  Allergen Reactions  . Oxycodone     MAKES PATIENT ANXIOUS & HYPER  . Tegaderm Ag Mesh [Silver]     Blisters    BP 120/80  Pulse 105  Temp(Src) 97.8 F (36.6 C)  Resp 20  Ht 5' 6.5" (1.689 m)  Wt 236 lb (107.049 kg)  BMI 37.53 kg/m2  Dg Chest Portable 1 View  02/21/2014   CLINICAL DATA:  Hypoxia.  EXAM: PORTABLE CHEST - 1 VIEW  COMPARISON:  Chest CT 09/30/2013.  FINDINGS: Endotracheal tube and NG tube noted in good anatomic position. Right upper lobe atelectatic changes noted. No pleural effusion or pneumothorax. Lucencies are noted in the right humeral proximal metaphysis. Right humerus series suggested for further evaluation. No acute bony abnormality .  IMPRESSION: 1. Right upper lobe atelectatic changes. 2. Endotracheal tube and NG tube in good anatomic position. 3. Lucencies within the proximal metaphysis of the right humerus, right humerus series suggested for further evaluation.   Electronically Signed   By: Marcello Moores  Register   On: 02/21/2014 14:54     Review of Systems  Constitutional: Negative for fever, chills and diaphoresis.  HENT: Negative for sore throat and trouble swallowing.   Eyes: Negative for photophobia and visual disturbance.  Respiratory: Negative for choking and shortness of  breath.   Cardiovascular: Negative for chest pain and palpitations.  Gastrointestinal: Negative for nausea, vomiting, abdominal pain, constipation, blood in stool, abdominal distention, anal bleeding and rectal pain.  Genitourinary: Negative for dysuria, urgency, difficulty urinating and testicular pain.  Musculoskeletal: Positive for myalgias. Negative for arthralgias, gait problem and neck pain.  Skin: Positive for wound. Negative for color change and rash.  Neurological: Positive for numbness. Negative for dizziness, speech difficulty and weakness.  Hematological: Negative for adenopathy.  Psychiatric/Behavioral: Negative for hallucinations, confusion and agitation.       Objective:   Physical Exam  Constitutional: He is oriented to person, place, and time. He appears well-developed and  well-nourished. No distress.  HENT:  Head: Normocephalic.  Mouth/Throat: Oropharynx is clear and moist. No oropharyngeal exudate.  Eyes: Conjunctivae and EOM are normal. Pupils are equal, round, and reactive to light. No scleral icterus.  Neck: Normal range of motion. No tracheal deviation present.  Cardiovascular: Normal rate, normal heart sounds and intact distal pulses.   Pulmonary/Chest: Effort normal. No respiratory distress.  Abdominal: Soft. He exhibits no distension. There is no tenderness. No hernia. Hernia confirmed negative in the ventral area, confirmed negative in the right inguinal area and confirmed negative in the left inguinal area.    Incisions clean with normal healing ridges.  No hernias  Musculoskeletal: Normal range of motion. He exhibits no tenderness.  Neurological: He is alert and oriented to person, place, and time. A sensory deficit is present. No cranial nerve deficit. He exhibits normal muscle tone. Coordination normal.  Mild right wrist numbness - intermittent  Skin: Skin is warm and dry. No rash noted. He is not diaphoretic.  Old thin scabs from prior skin dressing  blisters  Psychiatric: He has a normal mood and affect. His behavior is normal.       Assessment:     Recovering remarkably well from low anterior resection with diverting loop ileostomy.  Small separation on the medial corner of loop ileostomy.  Not severe. Improving  Right wrist intermittent numbness in patient with 3 prior wrist/hand surgeries.  Probable transient irritation.  Possible cellulitis at inferior part of incision    Plan:     Augmentin for 1 week for possible cellulitis in lower part of incision.  Keep the area clean and dry.  Too small to pack.  Use wrist exercises.  Use heat/approximate.  Gabapentin as needed for pain control.  Consider followup with the wrist/hand surgeon if not improved.  Hopefully transient and will resolve after completion of oral chemotherapy.  Continue ostomy care.  They felt a lot of support between the hospital and home health.  Tons of information.   Follow up with  Cena Benton with [facility] for outpt ostomy consultation/troubleshooting.  Diet as tolerated.  Low fat high fiber diet ideal.  Bowel regimen with 30 g fiber a day and fiber supplement as needed to avoid problems.  Reconsider using Imodium as needed to control any diarrhea issues.  Return to clinic 2-3 weeks, sooner as needed.   Instructions discussed.  Followup with primary care physician for other health issues as would normally be done.  Consider screening for malignancies (breast, prostate, colon, melanoma, etc) as appropriate.  F/u w Medical Oncology to continue post-adjuvant Tx.    Loop ileostomy takedown 3-6 weeks after finishing chemoTx:  The anatomy & physiology of the digestive tract was discussed.  The pathophysiology was discussed.  Possibility of remaining with an ostomy permanently was discussed.  I offered ostomy takedown.  Laparoscopic & open techniques were discussed.   Risks such as bleeding, infection, abscess, leak, reoperation, possible  re-ostomy, injury to other organs, hernia, heart attack, death, and other risks were discussed.   I noted a good likelihood this will help address the problem.  Goals of post-operative recovery were discussed as well.  We will work to minimize complications.  Questions were answered.  The patient expresses understanding & wishes to proceed with surgery.    Questions answered.  The patient & his wife expressed understanding and appreciation

## 2014-04-04 NOTE — Telephone Encounter (Signed)
RECEIVED A FAX FROM [employer] CONCERNING A CONFIRMATION OF PRESCRIPTION SHIPMENT FOR CAPECITABINE ON 04/03/14.

## 2014-04-11 ENCOUNTER — Encounter: Payer: Self-pay | Admitting: Oncology

## 2014-04-11 NOTE — Progress Notes (Signed)
Put [employer] disability form on nurse's desk. °

## 2014-04-12 ENCOUNTER — Encounter: Payer: Self-pay | Admitting: Oncology

## 2014-04-12 NOTE — Progress Notes (Signed)
Faxed disability form to [facility] @ [phone]

## 2014-04-17 ENCOUNTER — Other Ambulatory Visit: Payer: Self-pay | Admitting: *Deleted

## 2014-04-17 DIAGNOSIS — C20 Malignant neoplasm of rectum: Secondary | ICD-10-CM

## 2014-04-17 MED ORDER — CAPECITABINE 500 MG PO TABS: 2000.0000 mg | ORAL_TABLET | Freq: Two times a day (BID) | ORAL | Status: DC

## 2014-04-17 NOTE — Addendum Note (Signed)
Addended by: Wyonia Hough on: 04/17/2014 04:54 PM   Modules accepted: Orders

## 2014-04-17 NOTE — Telephone Encounter (Signed)
THIS REFILL REQUEST FOR CAPECITABINE WAS GIVEN TO DR.SHERRILL'S NURSE, AMY HORTON,RN. 

## 2014-04-18 ENCOUNTER — Telehealth: Payer: Self-pay | Admitting: Oncology

## 2014-04-18 ENCOUNTER — Other Ambulatory Visit: Payer: Self-pay | Admitting: *Deleted

## 2014-04-18 ENCOUNTER — Ambulatory Visit (HOSPITAL_BASED_OUTPATIENT_CLINIC_OR_DEPARTMENT_OTHER): Payer: Managed Care, Other (non HMO) | Admitting: Oncology

## 2014-04-18 ENCOUNTER — Other Ambulatory Visit (HOSPITAL_BASED_OUTPATIENT_CLINIC_OR_DEPARTMENT_OTHER): Payer: Managed Care, Other (non HMO)

## 2014-04-18 VITALS — BP 125/75 | HR 92 | Temp 97.9°F | Resp 18 | Ht 66.5 in | Wt 237.5 lb

## 2014-04-18 DIAGNOSIS — C20 Malignant neoplasm of rectum: Secondary | ICD-10-CM

## 2014-04-18 DIAGNOSIS — R197 Diarrhea, unspecified: Secondary | ICD-10-CM

## 2014-04-18 DIAGNOSIS — F17200 Nicotine dependence, unspecified, uncomplicated: Secondary | ICD-10-CM

## 2014-04-18 DIAGNOSIS — R911 Solitary pulmonary nodule: Secondary | ICD-10-CM

## 2014-04-18 LAB — CBC WITH DIFFERENTIAL/PLATELET
BASO%: 0.5 % (ref 0.0–2.0)
Basophils Absolute: 0 10*3/uL (ref 0.0–0.1)
EOS%: 2.1 % (ref 0.0–7.0)
Eosinophils Absolute: 0.1 10*3/uL (ref 0.0–0.5)
HCT: 44 % (ref 38.4–49.9)
HGB: 14.8 g/dL (ref 13.0–17.1)
LYMPH%: 13.8 % — ABNORMAL LOW (ref 14.0–49.0)
MCH: 31.5 pg (ref 27.2–33.4)
MCHC: 33.6 g/dL (ref 32.0–36.0)
MCV: 93.8 fL (ref 79.3–98.0)
MONO#: 0.5 10*3/uL (ref 0.1–0.9)
MONO%: 8.1 % (ref 0.0–14.0)
NEUT#: 4.7 10*3/uL (ref 1.5–6.5)
NEUT%: 75.5 % — ABNORMAL HIGH (ref 39.0–75.0)
Platelets: 357 10*3/uL (ref 140–400)
RBC: 4.69 10*6/uL (ref 4.20–5.82)
RDW: 17.3 % — ABNORMAL HIGH (ref 11.0–14.6)
WBC: 6.3 10*3/uL (ref 4.0–10.3)
lymph#: 0.9 10*3/uL (ref 0.9–3.3)

## 2014-04-18 LAB — COMPREHENSIVE METABOLIC PANEL (CC13)
ALBUMIN: 3.8 g/dL (ref 3.5–5.0)
ALK PHOS: 123 U/L (ref 40–150)
ALT: 29 U/L (ref 0–55)
AST: 25 U/L (ref 5–34)
Anion Gap: 9 mEq/L (ref 3–11)
BILIRUBIN TOTAL: 0.45 mg/dL (ref 0.20–1.20)
BUN: 13 mg/dL (ref 7.0–26.0)
CO2: 22 mEq/L (ref 22–29)
Calcium: 9.8 mg/dL (ref 8.4–10.4)
Chloride: 106 mEq/L (ref 98–109)
Creatinine: 0.8 mg/dL (ref 0.7–1.3)
Glucose: 99 mg/dl (ref 70–140)
POTASSIUM: 4 meq/L (ref 3.5–5.1)
SODIUM: 137 meq/L (ref 136–145)
Total Protein: 7.4 g/dL (ref 6.4–8.3)

## 2014-04-18 MED ORDER — CAPECITABINE 500 MG PO TABS: 2000.0000 mg | ORAL_TABLET | Freq: Two times a day (BID) | ORAL | Status: DC

## 2014-04-18 NOTE — Telephone Encounter (Signed)
Pt confirmed labs/ov per 07/21 POF, gave pt AVS...KJ °

## 2014-04-18 NOTE — Progress Notes (Signed)
  [facility] OFFICE PROGRESS NOTE   Diagnosis: Rectal cancer  INTERVAL HISTORY:   He completed a second cycle of Xeloda beginning on 04/04/2014. No mouth sores, nausea, diarrhea, or hand/foot pain. The numbness and discomfort at the right wrist has improved. He was placed on gabapentin by Dr. Johney Maine. He empties the ileostomy frequently. He is drinking adequate fluids.  Objective:  Vital signs in last 24 hours:  Blood pressure 125/75, pulse 92, temperature 97.9 F (36.6 C), temperature source Oral, resp. rate 18, height 5' 6.5" (1.689 m), weight 237 lb 8 oz (107.729 kg), SpO2 100.00%.    HEENT: No thrush or oral ulcers, healing ulcer at the lower outer lip Resp: Lungs clear bilaterally Cardio: Regular rate and rhythm GI: No hepatomegaly, right lower quadrant ileostomy, healed incision Vascular: No leg edema  Skin: Mild hyperpigmentation at the palms and soles. No erythema or skin breakdown.    Lab Results:  Lab Results  Component Value Date   WBC 6.3 04/18/2014   HGB 14.8 04/18/2014   HCT 44.0 04/18/2014   MCV 93.8 04/18/2014   PLT 357 04/18/2014   NEUTROABS 4.7 04/18/2014     Medications: I have reviewed the patient's current medications.  Assessment/Plan: 1. 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  Initiation of radiation and Xeloda 10/31/2013; completed on 12/07/2013.  Status post low anterior resection with diverting loop ileostomy 02/21/2014. Final pathology with microscopic foci of residual adenocarcinoma, largest focus 0.4 cm; extension focally beyond the muscularis propria, involving pericolonic fatty tissue; no lymph-vascular invasion; no perineural invasion; final resection margins negative.  Pathologic stage II (ypT3, ypN0, ypMX).  Cycle 1 adjuvant Xeloda 03/14/2014. Cycle 2 adjuvant Xeloda 04/04/2014 2. Indeterminate 3 mm right upper lobe nodule on staging chest CT 09/30/2013. 3. Rectal pain, constipation and bleeding secondary to  #1. Improved. 4. Tobacco use. He has quit smoking. 5. 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   Disposition:  Mr.Mcquaid appears stable. He will begin cycle 3 of adjuvant Xeloda on 04/25/2014. He will return for an office and lab visit 05/11/2014.  Betsy Coder, MD  04/18/2014  5:34 PM

## 2014-04-24 NOTE — Telephone Encounter (Signed)
RECEIVED A FAX FROM [employer] CONCERNING A CONFIRMATION OF PRESCRIPTION SHIPMENT FOR CAPECITABINE ON 04/21/14. 

## 2014-04-26 ENCOUNTER — Encounter (INDEPENDENT_AMBULATORY_CARE_PROVIDER_SITE_OTHER): Payer: Self-pay | Admitting: Surgery

## 2014-04-26 ENCOUNTER — Ambulatory Visit (INDEPENDENT_AMBULATORY_CARE_PROVIDER_SITE_OTHER): Payer: Commercial Indemnity | Admitting: Surgery

## 2014-04-26 VITALS — BP 128/76 | HR 100 | Temp 98.1°F | Resp 20 | Ht 66.5 in | Wt 240.6 lb

## 2014-04-26 DIAGNOSIS — R202 Paresthesia of skin: Secondary | ICD-10-CM

## 2014-04-26 DIAGNOSIS — Z932 Ileostomy status: Secondary | ICD-10-CM

## 2014-04-26 DIAGNOSIS — C20 Malignant neoplasm of rectum: Secondary | ICD-10-CM

## 2014-04-26 DIAGNOSIS — R209 Unspecified disturbances of skin sensation: Secondary | ICD-10-CM

## 2014-04-26 DIAGNOSIS — K429 Umbilical hernia without obstruction or gangrene: Secondary | ICD-10-CM

## 2014-04-26 NOTE — Patient Instructions (Signed)
Please consider the recommendations that we have given you today:  Complete chemotherapy.  Call us when starting fourth round of chemotherapy to schedule ostomy takedown surgery   Plan takedown of loop ileostomy 3-6 weeks from completion of chemotherapy.  You will need a contrast enema to make sure the anastomosis in the rectum is well healed first.  See the Handout(s) we have given you.  Please call our office at [phone] if you wish to schedule surgery or if you have further questions / concerns.   Ostomy Support Information  Yes, you've heard that people get along just fine with only one of their eyes, or one of their lungs, or one of their kidneys. But you also know that you have only one intestine and only one bladder, and that leaves you feeling awfully empty, both physically and emotionally: You think no other people go around without part of their intestine with the ends of their intestines sticking out through their abdominal walls.  Well, you are wrong! There are nearly three quarters of a million people in the [place] who have an ostomy; people who have had surgery to remove all or part of their colons or bladders. There is even a national association, the [place] [employer] [place] with over 350 local affiliated support groups that are organized by volunteers who provide peer support and counseling. [employer] has a toll free telephone num-ber, ([phone] and an educational,  interactive website, www.ostomy.org   An ostomy is an opening in the belly (abdominal wall) made by surgery. Ostomates are people who have had this procedure. The opening (stoma) allows the kidney or bowel to discharge waste. An external pouch covers the stoma to collect waste. Pouches are are a simple bag and are odor free. Different companies have disposable or reusable pouches to fit one's lifestyle. An ostomy can either be temporary or permanent.  THERE ARE THREE MAIN TYPES OF OSTOMIES  Colostomy. A  colostomy is a surgically created opening in the large intestine (colon).  Ileostomy. An ileostomy is a surgically created opening in the small intestine.  Urostomy. A urostomy is a surgically created opening to divert urine away from the bladder. FREQUENTLY ASKED QUESTIONS   Why haven't you met any of these folks who have an ostomy?  Well, maybe you have! You just did not recognize them because an ostomy doesn't show. It can be kept secret if you wish. Why, maybe some of your best friends, office associates or neighbors have an ostomy ... you never can tell.   People facing ostomy surgery have many quality-of-life questions like:  Will you bulge? Smell? Make noises? Will you feel waste leaving your body? Will you be a captive of the toilet? Will you starve? Be a social outcast? Get/stay married? Have babies? Easily bathe, go swimming, bend over?  OK, let's look at what you can expect:  Will you bulge?  Remember, without part of the intestine or bladder, and its contents, you should have a flatter tummy than before. You can expect to wear, with little exception, what you wore before surgery ... and this in-cludes tight clothing and bathing suits.  Will you smell?  Today, thanks to modern odor proof pouching systems, you can walk into an ostomy support group meeting and not smell anything that is foul or offensive. And, for those with an ileostomy or colostomy who are concerned about odor when emptying their pouch, there are in-pouch deodorants that can be used to eliminate any waste odors  that may exist.  Will you make noises?  Everyone produces gas, especially if they are an air-swallower. But intestinal sounds that occur from time to time are no differ-ent than a gurgling tummy, and quite often your clothing will muffle any sounds.   Will you feel the waste discharges?  For those with a colostomy or ileostomy there might be a slight pressure when waste leaves your body, but understand that  the intestines have no nerve endings, so there will be no unpleasant sensations. Those with a urostomy will probably be unaware of any kidney drainage.  Will you be a captive of the toilet?  Immediately post-op you will spend more time in the bathroom than you will after your body recovers from surgery. Every person is different, but on average those with an ileostomy or urostomy may empty their pouches 4 to 6 times a day; a little  less if you have a colostomy. The average wear time between pouch system changes is 3 to 5 days and the changing process should take less than 30 minutes.  Will I need to be on a special diet? Most people return to their normal diet when they have recovered from surgery. Be sure to chew your food well, eat a well-balanced diet and drink plenty of fluids. If you experience problems with a certain food, wait a couple of weeks and try it again. Will there be odor and noises? Pouching systems are designed to be odor-proof or odor-resistant. There are deodorants that can be used in the pouch. Medications are also available to help reduce odor. Limit gas-producing foods and carbonated beverages. You will experience less gas and fewer noises as you heal from surgery. How much time will it take to care for my ostomy? At first, you may spend a lot of time learning about your ostomy and how to take care of it. As you become more comfortable and skilled at changing the pouching system, it will take very little time to care for it.  Will I be able to return to work? People with ostomies can perform most jobs. As soon as you have healed from surgery, you should be able to return to work. Heavy lifting (more than 10 pounds) may be discouraged.  What about intimacy? Sexual relationships and intimacy are important and fulfilling aspects of your life. They should continue after ostomy surgery. Intimacy-related concerns should be discussed openly between you and your partner.  Can I wear  regular clothing? You do not need to wear special clothing. Ostomy pouches are fairly flat and barely noticeable. Elastic undergarments will not hurt the stoma or prevent the ostomy from functioning.  Can I participate in sports? An ostomy should not limit your involvement in sports. Many people with ostomies are runners, skiers, swimmers or participate in other active lifestyles. Talk with your caregiver first before doing heavy physical activity.  Will you starve?  Not if you follow doctor's orders at each stage of your post-op adjustment. There is no such thing as an "ostomy diet". Some people with an ostomy will be able to eat and tolerate anything; others may find diffi-culty with some foods. Each person is an individual and must determine, by trial, what is best for them. A good practice for all is to drink plenty of water.  Will you be a social outcast?  Have you met anyone who has an ostomy and is a social outcast? Why should you be the first? Only your attitude and self image will  effect how you are treated. No confi-dent person is an Occupational psychologist.   PROFESSIONAL HELP  Resources are available if you need help or have questions about your ostomy.    Specially trained nurses called Wound, Ostomy Continence Nurses (WOCN) are available for consultation in most major medical centers.   Consider getting an ostomy consult with Cena Benton at [facility] to help troubleshoot stoma pouch fittings and other issues with your ostomy: [phone]   The [employer] Ostomy Association (UOA) is a group made up of many local chapters throughout the [place]. These local groups hold meetings and provide support to prospective and existing ostomates. They sponsor educational events and have qualified visitors to make personal or telephone visits. Contact the UOA for the chapter nearest you and for other educational publications.  More detailed information can be found in Colostomy Guide, a  publication of the [employer] (UOA). Contact UOA at 1-818-764-8385 or visit their web site at https://arellano.com/. The website contains links to other sites, suppliers and resources. Document Released: 09/18/2003 Document Revised: 12/08/2011 Document Reviewed: 01/17/2009 [facility] Patient Information 2013 [place].  Loop Ileostomy Reversal A loop ileostomy reversal is surgery that reverses a temporary loop ileostomy. The small intestine is disconnected from the opening in the abdomen (stoma). It is then reconnected inside the body. A stoma and bag are no longer needed. Bowel movements can resume through the rectum. A reversal may be done after another part of your bowel has had time to heal and your caregiver feels that you are healthy enough to have surgery. LET YOUR CAREGIVER KNOW ABOUT:  Allergies to food or medicine.  Medicines taken, including vitamins, health supplements, herbs, eyedrops, over-the-counter medicines, and creams.  Use of steroids (by mouth or creams).  Previous problems with anesthetics or numbing medicines.  History of bleeding problems or blood clots.  Previous surgery.  Other health problems, including diabetes and kidney problems.  Possibility of pregnancy, if this applies. RISKS AND COMPLICATIONS  Reaction to anesthesia.  Infection inside the abdominal cavity (abscess).  Wound infection where the stoma was.  Blood clot.  Bleeding.  Scarring.  Slow recovery of intestinal function (ileus).  Intestinal blockage.  Intestinal leakage.  Narrowing of the joined area. BEFORE THE PROCEDURE It is important to follow your caregiver's instructions prior to your procedure. This helps to avoid complications. Steps before your procedure may include:  A physical exam, rectal exam, X-rays, and other procedures.  Chemotherapy or radiation therapy if your ileostomy was done as part of a cancer surgery.  A review of the procedure, the anesthesia  being used, and what to expect after the procedure. You may be asked to:  Stop taking certain medicines for several days prior to your procedure. This may include blood thinners (such as aspirin).  Take certain medicines, such as stool softeners.  Follow a special diet for several days prior to the procedure.  Avoid eating and drinking after midnight the night before the procedure. This will help you to avoid complications from the anesthesia.  Quit smoking. Smoking increases the chances of a healing problem after your procedure. PROCEDURE You will be given medicine that makes you sleep (general anesthetic).The surgeon will make a small cut (incision) around the stoma. The surgeon will then stitch or staple the 2 ends of the intestine back together, leaving the joined intestine inside the abdominal cavity. AFTER THE PROCEDURE  You will be given pain medicine.  Your caregivers will slowly increase your diet and movement.  You can expect to be in the hospital for about 3 to 5 days.  You should arrange for someone to help you with activities at home while you recover. Document Released: 09/04/2011 Document Revised: 12/08/2011 Document Reviewed: 09/04/2011 [facility] Patient Information 2015 [place], [place]. This information is not intended to replace advice given to you by your health care provider. Make sure you discuss any questions you have with your health care provider.

## 2014-04-26 NOTE — Progress Notes (Signed)
Subjective:     Patient ID: Stephen Costa, male   DOB: 02-14-1956, 58 y.o.   MRN: [id number]  HPI   Note: This dictation was prepared with Dragon/digital dictation along with [facility] technology. Any transcriptional errors that result from this process are unintentional.       Oran Dillenburg  [birth year] [id number]  Patient Care Team: Lynne Logan, MD as PCP - General (Family Medicine) [name], MD as Consulting Physician (Gastroenterology) Adin Hector, MD as Consulting Physician (General Surgery) Ladell Pier, MD as Consulting Physician (Oncology) Marye Round, MD as Consulting Physician (Radiation Oncology) Carola Frost, RN as Registered Nurse  Procedure (Date: 02/21/2014):  POST-OPERATIVE DIAGNOSIS: RECTAL CANCER   PROCEDURE: Procedure(s):  ROBOTIC SPLENIC FLEXURE MOBILIZATION  LOW ANTERIOR RESECTION  DIVERTING LOOP ILEOSTOMY  RIGID PROCTOSCOPY   SURGEON: Surgeon(s):  Adin Hector, MD  Leighton Ruff, MD Asst  This patient returns for surgical re-evaluation.  He is feeling okay.  On the third of 5 courses oral chemotherapy.  It does wipe them out and makes him a little loopy.  Wanting to get back to work but does not feel it is safe right now.  His medical oncologist agrees.   Gabapentin controlling his right wrist neuropathic pain.  He is emptying the ileostomy every 2-4 hours.  Output rather thickened.  Not using any antidiarrheals.    Not lightheaded or dizzy.  Urinating fine.  No fevers chills or sweats.  No bleeding.  Not using narcotics.  No nausea or vomiting.  Appetite okay.  In good spirits overall.  He feels like he is recovering rather well so far.  The small drainage area on the incision dried up after completing the Augmentin antibiotics.  No problem since.   Patient Active Problem List   Diagnosis Date Noted  . Paresthesias in right hand 03/29/2014  . 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 Rectal cancer ypT3ypN0 (0/16 LN) s/p LAR/ileostomy  02/21/2014 09/27/2013  . Umbilical hernia s/p repair 02/21/2014 09/27/2013  . Obesity (BMI 30-39.9) 09/27/2013  . Tobacco abuse 09/27/2013    Past Medical History  Diagnosis Date  . Rectal cancer 09/26/2013    ypT3ypN0 (0/16 LN)  . Sleep apnea     wears cpap most nights  . Hemorrhoids   . Cancer 10/05/13    rectum  . Allergy   . Arthritis     knees  . History of radiation therapy 10/31/13-12/08/13    rectal 50.4Gy total  . Rectal cancer     Past Surgical History  Procedure Laterality Date  . Lumbar disc surgery  1984, 1987  . Wrist surgery Right 2006  . Shoulder surgery Right 1999  . Surgery scrotal / testicular Left 58 y/o    respositioning into scrotum  . Eus N/A 10/05/2013    Procedure: LOWER ENDOSCOPIC ULTRASOUND (EUS);  Surgeon: [name], MD;  Location: [name];  Service: Endoscopy;  Laterality: N/A;  . Flexible sigmoidoscopy N/A 10/05/2013    Procedure: FLEXIBLE SIGMOIDOSCOPY;  Surgeon: [name], MD;  Location: [facility];  Service: Endoscopy;  Laterality: N/A;  . Proctoscopy N/A 02/21/2014    Procedure: RIGID PROCTOSCOPY;  Surgeon: Adin Hector, MD;  Location: [facility];  Service: General;  Laterality: N/A;  . Low anterior bowel resection  02/21/2014  . 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 Marital Status: Married    Spouse Name: N/A    Number of Children: N/A  .  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 Smoking status: Former Smoker -- 1.00 packs/day for 40 years    Types: Cigarettes  . Smokeless tobacco: Not on file  . Alcohol Use: No  . Drug Use: No  . Sexual Activity: Not on file   Other Topics Concern  . Not on file   Social History Narrative  . No narrative on file    Family History  Problem Relation Age of Onset  . Cancer Maternal Uncle     Throat Cancer  . Cancer Paternal Uncle     Colon Cancer    Current Outpatient  Prescriptions  Medication Sig Dispense Refill  . capecitabine (XELODA) 500 MG tablet Take 4 tablets (2,000 mg total) by mouth 2 (two) times daily after a meal. FOR 14 DAYS ON, 7 DAYS OFF  112 tablet  0  . gabapentin (NEURONTIN) 300 MG capsule Take 1 capsule (300 mg total) by mouth 2 (two) times daily. Increase to 4x/day as needed  60 capsule  1   No current facility-administered medications for this visit.     Allergies  Allergen Reactions  . Oxycodone     MAKES PATIENT ANXIOUS & HYPER  . Tegaderm Ag Mesh [Silver]     Blisters    BP 128/76  Pulse 100  Temp(Src) 98.1 F (36.7 C) (Oral)  Resp 20  Ht 5' 6.5" (1.689 m)  Wt 240 lb 9.6 oz (109.135 kg)  BMI 38.26 kg/m2  Dg Chest Portable 1 View  02/21/2014   CLINICAL DATA:  Hypoxia.  EXAM: PORTABLE CHEST - 1 VIEW  COMPARISON:  Chest CT 09/30/2013.  FINDINGS: Endotracheal tube and NG tube noted in good anatomic position. Right upper lobe atelectatic changes noted. No pleural effusion or pneumothorax. Lucencies are noted in the right humeral proximal metaphysis. Right humerus series suggested for further evaluation. No acute bony abnormality .  IMPRESSION: 1. Right upper lobe atelectatic changes. 2. Endotracheal tube and NG tube in good anatomic position. 3. Lucencies within the proximal metaphysis of the right humerus, right humerus series suggested for further evaluation.   Electronically Signed   By: Marcello Moores  Register   On: 02/21/2014 14:54     Review of Systems  Constitutional: Negative for fever, chills and diaphoresis.  HENT: Negative for sore throat and trouble swallowing.   Eyes: Negative for photophobia and visual disturbance.  Respiratory: Negative for choking and shortness of breath.   Cardiovascular: Negative for chest pain and palpitations.  Gastrointestinal: Negative for nausea, vomiting, abdominal pain, constipation, blood in stool, abdominal distention, anal bleeding and rectal pain.  Genitourinary: Negative for dysuria,  urgency, difficulty urinating and testicular pain.  Musculoskeletal: Positive for myalgias. Negative for arthralgias, gait problem and neck pain.  Skin: Negative for color change, rash and wound.  Neurological: Positive for numbness. Negative for dizziness, speech difficulty and weakness.  Hematological: Negative for adenopathy.  Psychiatric/Behavioral: Negative for hallucinations, confusion and agitation.       Objective:   Physical Exam  Constitutional: He is oriented to person, place, and time. He appears well-developed and well-nourished. No distress.  HENT:  Head: Normocephalic.  Mouth/Throat: Oropharynx is clear and moist. No oropharyngeal exudate.  Eyes: Conjunctivae and EOM are normal. Pupils are equal, round, and reactive to light. No scleral icterus.  Neck: Normal range of motion. No tracheal deviation present.  Cardiovascular: Normal rate, normal heart  sounds and intact distal pulses.   Pulmonary/Chest: Effort normal. No respiratory distress.  Abdominal: Soft. He exhibits no distension. There is no tenderness. No hernia. Hernia confirmed negative in the ventral area, confirmed negative in the right inguinal area and confirmed negative in the left inguinal area.    Incisions clean with normal healing ridges.  No hernias  Musculoskeletal: Normal range of motion. He exhibits no tenderness.  Neurological: He is alert and oriented to person, place, and time. A sensory deficit is present. No cranial nerve deficit. He exhibits normal muscle tone. Coordination normal.  Mild right wrist numbness - intermittent  Skin: Skin is warm and dry. No rash noted. He is not diaphoretic.  Old thin scabs from prior skin dressing blisters  Psychiatric: He has a normal mood and affect. His behavior is normal.       Assessment:     Recovering remarkably well from low anterior resection with diverting loop ileostomy.  Right wrist intermittent numbness in patient with 3 prior wrist/hand  surgeries.  Probable transient irritation.     Plan:     Use wrist exercises.  Use heat/approximate.  Gabapentin as needed for pain control.  Consider followup with the wrist/hand surgeon if not improved.  Hopefully transient and will resolve after completion of oral chemotherapy.  Continue ostomy care.  They felt a lot of support between the hospital and home health.  Tons of information.   Follow up with  Cena Benton with [facility] for outpt ostomy consultation/troubleshooting.  Diet as tolerated.  Low fat high fiber diet ideal.  Bowel regimen with 30 g fiber a day and fiber supplement as needed to avoid problems.  Reconsider using Imodium as needed to control any diarrhea issues.  Continue with Medical Oncology to continue post-adjuvant Tx.    Loop ileostomy takedown 3-6 weeks after finishing chemoTx.  Patient will need a contrast enema in to make sure no leak or stricture at the rectal anastomosis:  The anatomy & physiology of the digestive tract was discussed.  The pathophysiology was discussed.  Possibility of remaining with an ostomy permanently was discussed.  I offered ostomy takedown.  Laparoscopic & open techniques were discussed.   Risks such as bleeding, infection, abscess, leak, reoperation, possible re-ostomy, injury to other organs, hernia, heart attack, death, and other risks were discussed.   I noted a good likelihood this will help address the problem.  Goals of post-operative recovery were discussed as well.  We will work to minimize complications.  Questions were answered.  The patient expresses understanding & wishes to proceed with surgery.  Questions answered.  Instructions discussed.  Followup with primary care physician for other health issues as would normally be done.  Consider screening for malignancies (breast, prostate, colon, melanoma, etc) as appropriate.  The patient expressed understanding and appreciation.

## 2014-05-08 ENCOUNTER — Other Ambulatory Visit: Payer: Self-pay | Admitting: *Deleted

## 2014-05-08 DIAGNOSIS — C20 Malignant neoplasm of rectum: Secondary | ICD-10-CM

## 2014-05-08 NOTE — Telephone Encounter (Signed)
THIS REFILL REQUEST FOR CAPECITABINE WAS GIVEN TO DR.SHERRILL'S NURSE, SUSAN COWARD,RN. 

## 2014-05-09 MED ORDER — CAPECITABINE 500 MG PO TABS: 2000.0000 mg | ORAL_TABLET | Freq: Two times a day (BID) | ORAL | Status: DC

## 2014-05-09 NOTE — Addendum Note (Signed)
Addended by: Wyonia Hough on: 05/09/2014 02:21 PM   Modules accepted: Orders

## 2014-05-09 NOTE — Telephone Encounter (Addendum)
PRESCRIPTION IS NOW REFILLED AT [facility].

## 2014-05-10 ENCOUNTER — Telehealth (INDEPENDENT_AMBULATORY_CARE_PROVIDER_SITE_OTHER): Payer: Self-pay

## 2014-05-10 DIAGNOSIS — C20 Malignant neoplasm of rectum: Secondary | ICD-10-CM

## 2014-05-10 NOTE — Telephone Encounter (Signed)
Order placed for gastrographin enema in epic to be scheduled for pt.

## 2014-05-11 ENCOUNTER — Other Ambulatory Visit (HOSPITAL_BASED_OUTPATIENT_CLINIC_OR_DEPARTMENT_OTHER): Payer: Managed Care, Other (non HMO)

## 2014-05-11 ENCOUNTER — Telehealth: Payer: Self-pay | Admitting: Oncology

## 2014-05-11 ENCOUNTER — Telehealth: Payer: Self-pay

## 2014-05-11 ENCOUNTER — Ambulatory Visit (HOSPITAL_BASED_OUTPATIENT_CLINIC_OR_DEPARTMENT_OTHER): Payer: Managed Care, Other (non HMO) | Admitting: Nurse Practitioner

## 2014-05-11 VITALS — BP 129/81 | HR 91 | Temp 97.5°F | Resp 19 | Ht 66.5 in | Wt 245.8 lb

## 2014-05-11 DIAGNOSIS — K625 Hemorrhage of anus and rectum: Secondary | ICD-10-CM

## 2014-05-11 DIAGNOSIS — R911 Solitary pulmonary nodule: Secondary | ICD-10-CM

## 2014-05-11 DIAGNOSIS — C20 Malignant neoplasm of rectum: Secondary | ICD-10-CM

## 2014-05-11 DIAGNOSIS — R197 Diarrhea, unspecified: Secondary | ICD-10-CM

## 2014-05-11 DIAGNOSIS — K5900 Constipation, unspecified: Secondary | ICD-10-CM

## 2014-05-11 LAB — CBC WITH DIFFERENTIAL/PLATELET
BASO%: 0.7 % (ref 0.0–2.0)
Basophils Absolute: 0 10*3/uL (ref 0.0–0.1)
EOS ABS: 0.2 10*3/uL (ref 0.0–0.5)
EOS%: 2.9 % (ref 0.0–7.0)
HCT: 44 % (ref 38.4–49.9)
HGB: 15 g/dL (ref 13.0–17.1)
LYMPH%: 14.1 % (ref 14.0–49.0)
MCH: 32 pg (ref 27.2–33.4)
MCHC: 34.2 g/dL (ref 32.0–36.0)
MCV: 93.7 fL (ref 79.3–98.0)
MONO#: 0.7 10*3/uL (ref 0.1–0.9)
MONO%: 9.5 % (ref 0.0–14.0)
NEUT#: 5.1 10*3/uL (ref 1.5–6.5)
NEUT%: 72.8 % (ref 39.0–75.0)
PLATELETS: 329 10*3/uL (ref 140–400)
RBC: 4.7 10*6/uL (ref 4.20–5.82)
RDW: 19.7 % — ABNORMAL HIGH (ref 11.0–14.6)
WBC: 7 10*3/uL (ref 4.0–10.3)
lymph#: 1 10*3/uL (ref 0.9–3.3)

## 2014-05-11 LAB — COMPREHENSIVE METABOLIC PANEL
ALT: 23 U/L (ref 0–53)
AST: 22 U/L (ref 0–37)
Albumin: 3.6 g/dL (ref 3.5–5.2)
Alkaline Phosphatase: 130 U/L — ABNORMAL HIGH (ref 39–117)
BUN: 14 mg/dL (ref 6–23)
CO2: 20 mEq/L (ref 19–32)
Calcium: 9.4 mg/dL (ref 8.4–10.5)
Chloride: 98 mEq/L (ref 96–112)
Creatinine, Ser: 0.81 mg/dL (ref 0.50–1.35)
GLUCOSE: 100 mg/dL — AB (ref 70–99)
Potassium: 4.2 mEq/L (ref 3.5–5.3)
Sodium: 137 mEq/L (ref 135–145)
Total Protein: 7.3 g/dL (ref 6.0–8.3)

## 2014-05-11 NOTE — Telephone Encounter (Signed)
Called radiology to clarify what prep was needed for patients appt on the 18th for DG colo w/CM thru colostomy. They informed to have have the patient call for prep guidelines. Provided patient with the phone number for the radiology department to call for prep guidelines. Patient verbalized understanding.

## 2014-05-11 NOTE — Progress Notes (Signed)
  [place] [facility] OFFICE PROGRESS NOTE   Diagnosis:  Rectal cancer.  INTERVAL HISTORY:   Stephen Costa returns as scheduled. He completed cycle 3 Xeloda beginning 04/25/2014. He denies nausea/vomiting. No mouth sores. Ostomy output ranges from thin to thick consistency. He feels the changes are diet related. No significant watery diarrhea. He reports good fluid intake. No hand or foot pain or redness. He continues to have numbness/tingling and discomfort at the right wrist. He has noted some improvement since beginning gabapentin.  Objective:  Vital signs in last 24 hours:  Blood pressure 129/81, pulse 91, temperature 97.5 F (36.4 C), temperature source Oral, resp. rate 19, height 5' 6.5" (1.689 m), weight 245 lb 12.8 oz (111.494 kg).    HEENT: No thrush or ulcers. Resp: Lungs clear bilaterally. Cardio: Regular rate and rhythm. GI: Abdomen soft and nontender. No hepatomegaly. Right lower quadrant ileostomy. Vascular: No leg edema.  Skin: Palms without erythema. Mild hyperpigmentation over the palms.    Lab Results:  Lab Results  Component Value Date   WBC 7.0 05/11/2014   HGB 15.0 05/11/2014   HCT 44.0 05/11/2014   MCV 93.7 05/11/2014   PLT 329 05/11/2014   NEUTROABS 5.1 05/11/2014    Imaging:  No results found.  Medications: I have reviewed the patient's current medications.  Assessment/Plan: 1. 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  Initiation of radiation and Xeloda 10/31/2013; completed on 12/07/2013.  Status post low anterior resection with diverting loop ileostomy 02/21/2014. Final pathology with microscopic foci of residual adenocarcinoma, largest focus 0.4 cm; extension focally beyond the muscularis propria, involving pericolonic fatty tissue; no lymph-vascular invasion; no perineural invasion; final resection margins negative.  Pathologic stage II (ypT3, ypN0, ypMX).  Cycle 1 adjuvant Xeloda 03/14/2014.  Cycle 2 adjuvant Xeloda  04/04/2014. Cycle 3 adjuvant Xeloda 04/25/2014. 2. Indeterminate 3 mm right upper lobe nodule on staging chest CT 09/30/2013. 3. Rectal pain, constipation and bleeding secondary to #1. Improved. 4. Tobacco use. He has quit smoking. 5. 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   Disposition: Stephen Costa appears stable. He has completed 3 cycles of adjuvant Xeloda. Plan to proceed with cycle 4 as scheduled beginning 05/16/2014. He will return for a followup visit on 05/31/2014. He will contact the office in the interim with any problems.    Ned Card ANP/GNP-BC   05/11/2014  9:36 AM

## 2014-05-11 NOTE — Telephone Encounter (Signed)
gv pt appt schedule for sept °

## 2014-05-16 ENCOUNTER — Other Ambulatory Visit (INDEPENDENT_AMBULATORY_CARE_PROVIDER_SITE_OTHER): Payer: Self-pay | Admitting: Surgery

## 2014-05-16 ENCOUNTER — Telehealth (INDEPENDENT_AMBULATORY_CARE_PROVIDER_SITE_OTHER): Payer: Self-pay

## 2014-05-16 ENCOUNTER — Ambulatory Visit (HOSPITAL_COMMUNITY)
Admission: RE | Admit: 2014-05-16 | Discharge: 2014-05-16 | Disposition: A | Discharge status: Home / Self Care | Payer: Managed Care, Other (non HMO) | Source: Ambulatory Visit | Attending: Surgery | Admitting: Surgery

## 2014-05-16 DIAGNOSIS — Z932 Ileostomy status: Secondary | ICD-10-CM | POA: Insufficient documentation

## 2014-05-16 DIAGNOSIS — Z85038 Personal history of other malignant neoplasm of large intestine: Secondary | ICD-10-CM | POA: Diagnosis present

## 2014-05-16 DIAGNOSIS — C20 Malignant neoplasm of rectum: Secondary | ICD-10-CM

## 2014-05-16 MED ORDER — IOHEXOL 300 MG/ML  SOLN
450.0000 mL | Freq: Once | INTRAMUSCULAR | Status: AC | PRN
  Administered 2014-05-16: 450 mL

## 2014-05-16 NOTE — Telephone Encounter (Signed)
LMOM stating the DG colon is ok showing no leaks. I advised pt that I turned the surgical orders into coding for schedulers to call pt.

## 2014-05-31 ENCOUNTER — Other Ambulatory Visit: Payer: Self-pay | Admitting: Oncology

## 2014-05-31 ENCOUNTER — Other Ambulatory Visit (HOSPITAL_BASED_OUTPATIENT_CLINIC_OR_DEPARTMENT_OTHER): Payer: Managed Care, Other (non HMO)

## 2014-05-31 ENCOUNTER — Ambulatory Visit (HOSPITAL_BASED_OUTPATIENT_CLINIC_OR_DEPARTMENT_OTHER): Payer: Managed Care, Other (non HMO) | Admitting: Oncology

## 2014-05-31 ENCOUNTER — Telehealth: Payer: Self-pay | Admitting: Oncology

## 2014-05-31 VITALS — BP 153/96 | HR 69 | Temp 98.0°F | Resp 19 | Ht 66.0 in | Wt 247.6 lb

## 2014-05-31 DIAGNOSIS — R197 Diarrhea, unspecified: Secondary | ICD-10-CM

## 2014-05-31 DIAGNOSIS — Z87891 Personal history of nicotine dependence: Secondary | ICD-10-CM

## 2014-05-31 DIAGNOSIS — C20 Malignant neoplasm of rectum: Secondary | ICD-10-CM

## 2014-05-31 DIAGNOSIS — R911 Solitary pulmonary nodule: Secondary | ICD-10-CM

## 2014-05-31 LAB — CBC WITH DIFFERENTIAL/PLATELET
BASO%: 0.5 % (ref 0.0–2.0)
Basophils Absolute: 0 10*3/uL (ref 0.0–0.1)
EOS ABS: 0.2 10*3/uL (ref 0.0–0.5)
EOS%: 3.3 % (ref 0.0–7.0)
HEMATOCRIT: 45.6 % (ref 38.4–49.9)
HGB: 15.2 g/dL (ref 13.0–17.1)
LYMPH%: 15.1 % (ref 14.0–49.0)
MCH: 31.8 pg (ref 27.2–33.4)
MCHC: 33.3 g/dL (ref 32.0–36.0)
MCV: 95.3 fL (ref 79.3–98.0)
MONO#: 0.5 10*3/uL (ref 0.1–0.9)
MONO%: 9.6 % (ref 0.0–14.0)
NEUT%: 71.5 % (ref 39.0–75.0)
NEUTROS ABS: 4.1 10*3/uL (ref 1.5–6.5)
PLATELETS: 298 10*3/uL (ref 140–400)
RBC: 4.78 10*6/uL (ref 4.20–5.82)
RDW: 20.6 % — ABNORMAL HIGH (ref 11.0–14.6)
WBC: 5.7 10*3/uL (ref 4.0–10.3)
lymph#: 0.9 10*3/uL (ref 0.9–3.3)

## 2014-05-31 LAB — COMPREHENSIVE METABOLIC PANEL (CC13)
ALT: 22 U/L (ref 0–55)
AST: 19 U/L (ref 5–34)
Albumin: 3.4 g/dL — ABNORMAL LOW (ref 3.5–5.0)
Alkaline Phosphatase: 124 U/L (ref 40–150)
Anion Gap: 8 mEq/L (ref 3–11)
BILIRUBIN TOTAL: 0.33 mg/dL (ref 0.20–1.20)
BUN: 9.8 mg/dL (ref 7.0–26.0)
CHLORIDE: 108 meq/L (ref 98–109)
CO2: 22 mEq/L (ref 22–29)
Calcium: 9.1 mg/dL (ref 8.4–10.4)
Creatinine: 0.8 mg/dL (ref 0.7–1.3)
Glucose: 100 mg/dl (ref 70–140)
Potassium: 4 mEq/L (ref 3.5–5.1)
Sodium: 139 mEq/L (ref 136–145)
TOTAL PROTEIN: 7.1 g/dL (ref 6.4–8.3)

## 2014-05-31 NOTE — Progress Notes (Signed)
  [place] OFFICE PROGRESS NOTE   Diagnosis: Rectal cancer  INTERVAL HISTORY:   Mr.Rumery returns as scheduled. He completed another cycle of Xeloda beginning 05/16/2014. No mouth sores or hand/foot pain. He notes increased output from the ostomy while on Xeloda. He is drinking adequate fluids. He feels well. The neuropathy symptoms have improved and he is no longer taking gabapentin.  Objective:  Vital signs in last 24 hours:  Blood pressure 153/96, pulse 69, temperature 98 F (36.7 C), temperature source Oral, resp. rate 19, height 5\' 6"  (1.676 m), weight 247 lb 9.6 oz (112.311 kg), SpO2 94.00%.    HEENT: No thrush or ulcer Resp: Lungs clear bilaterally Cardio: Regular rate and rhythm GI: No hepatomegaly, nontender. Right lower quadrant ileostomy. Vascular: No leg edema   Skin: Mild skin thickening and hyperpigmentation of the hands. Mild hyperpigmentation of the soles. No skin breakdown.     Lab Results:  Lab Results  Component Value Date   WBC 5.7 05/31/2014   HGB 15.2 05/31/2014   HCT 45.6 05/31/2014   MCV 95.3 05/31/2014   PLT 298 05/31/2014   NEUTROABS 4.1 05/31/2014     Lab Results  Component Value Date   CEA 5.8* 09/27/2013    Medications: I have reviewed the patient's current medications.  Assessment/Plan: 1. 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  Initiation of radiation and Xeloda 10/31/2013; completed on 12/07/2013.  Status post low anterior resection with diverting loop ileostomy 02/21/2014. Final pathology with microscopic foci of residual adenocarcinoma, largest focus 0.4 cm; extension focally beyond the muscularis propria, involving pericolonic fatty tissue; no lymph-vascular invasion; no perineural invasion; final resection margins negative.  Pathologic stage II (ypT3, ypN0, ypMX).  Cycle 1 adjuvant Xeloda 03/14/2014.  Cycle 2 adjuvant Xeloda 04/04/2014.  Cycle 3 adjuvant Xeloda 04/25/2014. Cycle 4 adjuvant Xeloda  05/16/2014 Cycle 5 adjuvant Xeloda 06/06/2014 2. Indeterminate 3 mm right upper lobe nodule on staging chest CT 09/30/2013. 3. Rectal pain, constipation and bleeding secondary to #1. Improved. 4. Tobacco use. He has quit smoking. 5. 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   Disposition:  He appears to be tolerating the Xeloda well. The plan is to complete the final cycle of adjuvant Xeloda beginning 06/06/2014. He will contact Dr. Johney Maine to plan the ileostomy takedown. He will return for an office visit and CEA on 06/27/2014. We will schedule a chest CT to followup on the indeterminate lung nodule seen on the CT 09/30/2013.  Betsy Coder, MD  05/31/2014  10:53 AM

## 2014-05-31 NOTE — Telephone Encounter (Signed)
gv adn printed appt sched and avs for pt for SEpt °

## 2014-05-31 NOTE — Progress Notes (Signed)
Pt declined flu shot in office today after explanation of purpose.  Pt states "I don't believe in the flu shot; people die of pneumonia not the flu; I don't want it"

## 2014-06-01 ENCOUNTER — Telehealth: Payer: Self-pay | Admitting: Oncology

## 2014-06-01 NOTE — Telephone Encounter (Signed)
lvm for pt regarding to Sept and NOV appt

## 2014-06-27 ENCOUNTER — Ambulatory Visit (HOSPITAL_BASED_OUTPATIENT_CLINIC_OR_DEPARTMENT_OTHER): Payer: Managed Care, Other (non HMO) | Admitting: Nurse Practitioner

## 2014-06-27 ENCOUNTER — Telehealth: Payer: Self-pay | Admitting: Nurse Practitioner

## 2014-06-27 ENCOUNTER — Other Ambulatory Visit: Payer: Managed Care, Other (non HMO)

## 2014-06-27 VITALS — BP 153/82 | HR 99 | Temp 98.4°F | Resp 20 | Ht 66.0 in | Wt 254.1 lb

## 2014-06-27 DIAGNOSIS — K625 Hemorrhage of anus and rectum: Secondary | ICD-10-CM

## 2014-06-27 DIAGNOSIS — C20 Malignant neoplasm of rectum: Secondary | ICD-10-CM

## 2014-06-27 DIAGNOSIS — R197 Diarrhea, unspecified: Secondary | ICD-10-CM

## 2014-06-27 DIAGNOSIS — Z87891 Personal history of nicotine dependence: Secondary | ICD-10-CM

## 2014-06-27 DIAGNOSIS — R911 Solitary pulmonary nodule: Secondary | ICD-10-CM

## 2014-06-27 NOTE — Telephone Encounter (Signed)
Pt confirmed labs/ov per 09/29 POF, cancelled 11/02 CT r/s for 10/14 per Dorian Pod at GI office, gave pt AVS..... KJ

## 2014-06-27 NOTE — Progress Notes (Addendum)
  [facility] OFFICE PROGRESS NOTE   Diagnosis:  Rectal cancer  INTERVAL HISTORY:   Stephen Costa returns as scheduled. He completed the final cycle of adjuvant Xeloda beginning 06/06/2014. He denies nausea/vomiting. No mouth sores. No diarrhea. No hand or foot pain or redness. He overall feels well. He reports he is scheduled for reversal of the ileostomy on 07/20/2014.  Objective:  Vital signs in last 24 hours:  Blood pressure 153/82, pulse 99, temperature 98.4 F (36.9 C), temperature source Oral, resp. rate 20, height 5\' 6"  (1.676 m), weight 254 lb 1.6 oz (115.259 kg), SpO2 100.00%.    HEENT: No thrush or ulcers. Lymphatics: No palpable cervical, supraclavicular or axillary lymph nodes. Resp: Lungs clear bilaterally. Cardio: Regular rate and rhythm. GI: Abdomen soft and nontender. No hepatomegaly. Right lower quadrant ileostomy. Vascular: No leg edema.  Skin: Palms without erythema.    Lab Results:  Lab Results  Component Value Date   WBC 5.7 05/31/2014   HGB 15.2 05/31/2014   HCT 45.6 05/31/2014   MCV 95.3 05/31/2014   PLT 298 05/31/2014   NEUTROABS 4.1 05/31/2014    Imaging:  No results found.  Medications: I have reviewed the patient's current medications.  Assessment/Plan: 1. 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  Initiation of radiation and Xeloda 10/31/2013; completed on 12/07/2013.  Status post low anterior resection with diverting loop ileostomy 02/21/2014. Final pathology with microscopic foci of residual adenocarcinoma, largest focus 0.4 cm; extension focally beyond the muscularis propria, involving pericolonic fatty tissue; no lymph-vascular invasion; no perineural invasion; final resection margins negative.  Pathologic stage II (ypT3, ypN0, ypMX).  Cycle 1 adjuvant Xeloda 03/14/2014.  Cycle 2 adjuvant Xeloda 04/04/2014.  Cycle 3 adjuvant Xeloda 04/25/2014.  Cycle 4 adjuvant Xeloda 05/16/2014  Cycle 5 adjuvant Xeloda  06/06/2014 2. Indeterminate 3 mm right upper lobe nodule on staging chest CT 09/30/2013. 3. Rectal pain, constipation and bleeding secondary to #1. Improved. 4. Tobacco use. He has quit smoking. 5. 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   Disposition: Stephen Costa appears well. He has completed the course of adjuvant Xeloda chemotherapy. We are referring him for CT scans of the chest/abdomen/pelvis in the next one to 2 weeks. He is scheduled for the ileostomy takedown procedure on 07/20/2014. We will schedule a return visit here with a CEA in 4 months. He will contact the office in the interim with any problems.  Patient seen with Dr. Benay Spice.   Stephen Costa ANP/GNP-BC   06/27/2014  2:12 PM   This was a shared visit with Stephen Costa. Stephen Costa has completed adjuvant therapy.  We will ask Dr. Johney Maine to recommend the timing of a surveillance colonoscopy.  He will return for an office visit and CEA in 4 months.  Stephen Costa, M.D.

## 2014-06-28 LAB — CEA: CEA: 3.5 ng/mL (ref 0.0–5.0)

## 2014-06-29 ENCOUNTER — Ambulatory Visit
Admission: RE | Admit: 2014-06-29 | Discharge: 2014-06-29 | Disposition: A | Discharge status: Home / Self Care | Payer: Managed Care, Other (non HMO) | Source: Ambulatory Visit | Attending: Radiation Oncology | Admitting: Radiation Oncology

## 2014-06-29 ENCOUNTER — Encounter: Payer: Self-pay | Admitting: Radiation Oncology

## 2014-06-29 VITALS — BP 151/92 | HR 107 | Temp 97.8°F | Resp 16 | Wt 257.6 lb

## 2014-06-29 DIAGNOSIS — C20 Malignant neoplasm of rectum: Secondary | ICD-10-CM

## 2014-06-29 NOTE — Progress Notes (Signed)
He is currently in no pain. Pt complains of, Loss of Sleep. Reports his stoma site has been irritated by the adhesive and it blisters.  Reports his colostomy bag has been draining well, and he adjust his diet if he starts to notice changes in the consistency. The patient eats a regular, healthy diet. Reports urinary status is normal, no foul odor or pain.

## 2014-06-29 NOTE — Progress Notes (Signed)
  Radiation Oncology         [phone] ________________________________  Name: Stephen Costa MRN: [id number]  Date: 06/29/2014  DOB: 1956-08-14  Follow-Up Visit Note  CC: Lynne Logan, MD  Michael Boston, MD  Diagnosis:     ICD-9-CM ICD-10-CM  1. Rectal cancer ypT3ypN0 (0/16 LN) s/p LAR/ileostomy 02/21/2014 154.1 C20    Interval Since Last Radiation:  Approximately 7 months   Narrative:  The patient returns today for routine follow-up.  The patient is is doing well clinically. No signs of ongoing toxicity or side effects from his prior radiation treatment. No nausea. No ongoing diarrhea.                              ALLERGIES:  is allergic to oxycodone and tegaderm ag mesh.  Meds: Current Outpatient Prescriptions  Medication Sig Dispense Refill  . gabapentin (NEURONTIN) 300 MG capsule Take 1 capsule (300 mg total) by mouth 2 (two) times daily. Increase to 4x/day as needed  60 capsule  1  . XELODA 500 MG tablet TAKE 4 TABLETS BY MOUTH EVERY MORNING AND 4 TABLETS BY MOUTH EVERY EVENING AFTER MEALS FOR 14 DAYS ON, THEN 7 DAYS OFF.  112 tablet  0   No current facility-administered medications for this encounter.    Physical Findings: The patient is in no acute distress. Patient is alert and oriented.  weight is 257 lb 9.6 oz (116.847 kg). His oral temperature is 97.8 F (36.6 C). His blood pressure is 151/92 and his pulse is 107. His respiration is 16 and oxygen saturation is 95%. .     Lab Findings: Lab Results  Component Value Date   WBC 5.7 05/31/2014   HGB 15.2 05/31/2014   HCT 45.6 05/31/2014   MCV 95.3 05/31/2014   PLT 298 05/31/2014     Radiographic Findings: No results found.  Impression:    The patient clinically is doing well. The patient is continuing followup with medical oncology. The patient is scheduled to have his colostomy reversed on 07/20/2014. He is very pleased with this. He has been having some irritation around the colostomy site although this has been working  well.  Plan:  The patient will followup in our clinic on a prn basis.  I spent 10 minutes with the patient today, the majority of which was spent counseling the patient on the diagnosis of cancer and coordinating care.   Jodelle Gross, M.D., Ph.D.

## 2014-07-12 ENCOUNTER — Ambulatory Visit
Admission: RE | Admit: 2014-07-12 | Discharge: 2014-07-12 | Disposition: A | Discharge status: Home / Self Care | Payer: Managed Care, Other (non HMO) | Source: Ambulatory Visit | Attending: Nurse Practitioner | Admitting: Nurse Practitioner

## 2014-07-12 ENCOUNTER — Other Ambulatory Visit: Payer: Managed Care, Other (non HMO)

## 2014-07-12 DIAGNOSIS — C20 Malignant neoplasm of rectum: Secondary | ICD-10-CM

## 2014-07-12 MED ORDER — IOHEXOL 300 MG/ML  SOLN: 125.0000 mL | Freq: Once | INTRAMUSCULAR | Status: DC | PRN

## 2014-07-13 ENCOUNTER — Encounter (HOSPITAL_COMMUNITY): Payer: Self-pay | Admitting: Pharmacy Technician

## 2014-07-14 ENCOUNTER — Encounter (HOSPITAL_COMMUNITY)
Admission: RE | Admit: 2014-07-14 | Discharge: 2014-07-14 | Disposition: A | Discharge status: Home / Self Care | Payer: Managed Care, Other (non HMO) | Source: Ambulatory Visit | Attending: Surgery | Admitting: Surgery

## 2014-07-14 ENCOUNTER — Encounter (HOSPITAL_COMMUNITY): Payer: Self-pay

## 2014-07-14 ENCOUNTER — Ambulatory Visit: Admission: RE | Admit: 2014-07-14 | Payer: Managed Care, Other (non HMO) | Source: Ambulatory Visit

## 2014-07-14 ENCOUNTER — Ambulatory Visit
Admission: RE | Admit: 2014-07-14 | Discharge: 2014-07-14 | Disposition: A | Discharge status: Home / Self Care | Payer: Managed Care, Other (non HMO) | Source: Ambulatory Visit | Attending: Family Medicine | Admitting: Family Medicine

## 2014-07-14 ENCOUNTER — Other Ambulatory Visit: Payer: Self-pay | Admitting: Family Medicine

## 2014-07-14 DIAGNOSIS — C20 Malignant neoplasm of rectum: Secondary | ICD-10-CM

## 2014-07-14 DIAGNOSIS — Z01818 Encounter for other preprocedural examination: Secondary | ICD-10-CM | POA: Diagnosis present

## 2014-07-14 LAB — BASIC METABOLIC PANEL
Anion gap: 14 (ref 5–15)
BUN: 10 mg/dL (ref 6–23)
CO2: 22 meq/L (ref 19–32)
Calcium: 9.4 mg/dL (ref 8.4–10.5)
Chloride: 99 mEq/L (ref 96–112)
Creatinine, Ser: 0.77 mg/dL (ref 0.50–1.35)
GFR calc Af Amer: >90 mL/min (ref >90)
GFR calc non Af Amer: >90 mL/min (ref >90)
GLUCOSE: 105 mg/dL — AB (ref 70–99)
Potassium: 4.7 mEq/L (ref 3.7–5.3)
SODIUM: 135 meq/L — AB (ref 137–147)

## 2014-07-14 LAB — CBC
HEMATOCRIT: 42.7 % (ref 39.0–52.0)
HEMOGLOBIN: 15.1 g/dL (ref 13.0–17.0)
MCH: 33.4 pg (ref 26.0–34.0)
MCHC: 35.4 g/dL (ref 30.0–36.0)
MCV: 94.5 fL (ref 78.0–100.0)
Platelets: 314 10*3/uL (ref 150–400)
RBC: 4.52 MIL/uL (ref 4.22–5.81)
RDW: 16.3 % — ABNORMAL HIGH (ref 11.5–15.5)
WBC: 5.8 10*3/uL (ref 4.0–10.5)

## 2014-07-14 MED ORDER — IOHEXOL 300 MG/ML  SOLN
75.0000 mL | Freq: Once | INTRAMUSCULAR | Status: AC | PRN
  Administered 2014-07-14: 75 mL via INTRAVENOUS

## 2014-07-14 NOTE — Patient Instructions (Signed)
[place]  07/14/2014    Your procedure is scheduled on:     07/20/2014  Report to [facility] Main Entrance and follow signs to  [place] arrive at [place] AM.   Call this number if you have problems the morning of surgery [phone] or Presurgical Testing [phone].   Remember:  Do not eat food or drink liquids :After Midnight.     Take these medicines the morning of surgery with A SIP OF WATER: NONE                               You may not have any metal on your body including hair pins and piercings  Do not wear jewelry, lotions, powders, or deodorant.  Men may shave face and neck.               Do not bring valuables to the hospital. [place].  Contacts, dentures or bridgework may not be worn into surgery.  Leave suitcase in the car. After surgery it may be brought to your room.  For patients admitted to the hospital, checkout time is 11:00 AM the day of discharge.   ________________________________________________________________________  [facility] - Preparing for Surgery Before surgery, you can play an important role.  Because skin is not sterile, your skin needs to be as free of germs as possible.  You can reduce the number of germs on your skin by washing with CHG (chlorahexidine gluconate) soap before surgery.  CHG is an antiseptic cleaner which kills germs and bonds with the skin to continue killing germs even after washing. Please DO NOT use if you have an allergy to CHG or antibacterial soaps.  If your skin becomes reddened/irritated stop using the CHG and inform your nurse when you arrive at Short Stay. Do not shave (including legs and underarms) for at least 48 hours prior to the first CHG shower.  You may shave your face/neck. Please follow these instructions carefully:  1.  Shower with CHG Soap the night before surgery and the  morning of Surgery.  2.  If you choose to wash your hair, wash your hair first as  usual with your  normal  shampoo.  3.  After you shampoo, rinse your hair and body thoroughly to remove the  shampoo.                           4.  Use CHG as you would any other liquid soap.  You can apply chg directly  to the skin and wash                       Gently with a scrungie or clean washcloth.  5.  Apply the CHG Soap to your body ONLY FROM THE NECK DOWN.   Do not use on face/ open                           Wound or open sores. Avoid contact with eyes, ears mouth and genitals (private parts).                       Wash face,  Genitals (private parts) with your normal soap.             6.  Wash  thoroughly, paying special attention to the area where your surgery  will be performed.  7.  Thoroughly rinse your body with warm water from the neck down.  8.  DO NOT shower/wash with your normal soap after using and rinsing off  the CHG Soap.                9.  Pat yourself dry with a clean towel.            10.  Wear clean pajamas.            11.  Place clean sheets on your bed the night of your first shower and do not  sleep with pets. Day of Surgery : Do not apply any lotions/deodorants the morning of surgery.  Please wear clean clothes to the hospital/surgery center.  FAILURE TO FOLLOW THESE INSTRUCTIONS MAY RESULT IN THE CANCELLATION OF YOUR SURGERY PATIENT SIGNATURE_________________________________  NURSE SIGNATURE__________________________________  ________________________________________________________________________

## 2014-07-14 NOTE — Progress Notes (Signed)
Pt due to have CT preformed this afternoon 07/14/2014  Pt has CPAP but states he does not use

## 2014-07-18 ENCOUNTER — Telehealth: Payer: Self-pay | Admitting: *Deleted

## 2014-07-18 NOTE — Telephone Encounter (Signed)
Call received from Stephen Costa reporting nurse called left message for him to return call to discuss scan results.  He called 07-14-2014 for results and no return call as of this time.  Would like a call to [phone] with results.  Next scheduled f/u is 10-20-2014.  Will notify collaborative nurse.

## 2014-07-18 NOTE — Telephone Encounter (Signed)
Patient left VM requesting return call for his CT results (was returning office call).

## 2014-07-18 NOTE — Telephone Encounter (Signed)
Called patient for CT results-had to leave VM again. Requested he return call with alternate # that we can reach him on or we can mail him the results.

## 2014-07-18 NOTE — Telephone Encounter (Signed)
Notified patient that CT A/P showed no active cancer. Some stranding in omentum-meaning nonspecific inflammation. CT chest showed 3 mm nodule RUL is stable. He requests copy be mailed to his home.

## 2014-07-19 MED ORDER — BUPIVACAINE 0.25 % ON-Q PUMP DUAL CATH 300 ML
300.0000 mL | INJECTION | Status: DC
  Filled 2014-07-19: qty 300

## 2014-07-19 MED ORDER — GENTAMICIN SULFATE 40 MG/ML IJ SOLN
INTRAMUSCULAR | Status: DC
  Filled 2014-07-19: qty 6

## 2014-07-19 NOTE — Anesthesia Preprocedure Evaluation (Addendum)
Anesthesia Evaluation  Patient identified by MRN, date of birth, ID band Patient awake    Reviewed: Allergy & Precautions, H&P , NPO status , Patient's Chart, lab work & pertinent test results, reviewed documented beta blocker date and time   History of Anesthesia Complications Negative for: history of anesthetic complications  Airway Mallampati: III TM Distance: >3 FB Neck ROM: Full    Dental no notable dental hx. (+) Dental Advisory Given   Pulmonary sleep apnea and Continuous Positive Airway Pressure Ventilation , former smoker,  breath sounds clear to auscultation  Pulmonary exam normal       Cardiovascular negative cardio ROS  Rhythm:Regular Rate:Normal     Neuro/Psych negative neurological ROS  negative psych ROS   GI/Hepatic negative GI ROS, Neg liver ROS,   Endo/Other  Morbid obesity  Renal/GU negative Renal ROS  negative genitourinary   Musculoskeletal  (+) Arthritis -, Osteoarthritis,    Abdominal (+) + obese,   Peds negative pediatric ROS (+)  Hematology negative hematology ROS (+)   Anesthesia Other Findings Thick neck, recessed chin  Reproductive/Obstetrics negative OB ROS                         Anesthesia Physical Anesthesia Plan  ASA: III  Anesthesia Plan: General   Post-op Pain Management:    Induction: Intravenous  Airway Management Planned: Oral ETT and Video Laryngoscope Planned  Additional Equipment:   Intra-op Plan:   Post-operative Plan: Extubation in OR  Informed Consent: I have reviewed the patients History and Physical, chart, labs and discussed the procedure including the risks, benefits and alternatives for the proposed anesthesia with the patient or authorized representative who has indicated his/her understanding and acceptance.   Dental advisory given  Plan Discussed with: CRNA  Anesthesia Plan Comments:        Anesthesia Quick  Evaluation

## 2014-07-20 ENCOUNTER — Inpatient Hospital Stay (HOSPITAL_COMMUNITY): Payer: Managed Care, Other (non HMO) | Admitting: Anesthesiology

## 2014-07-20 ENCOUNTER — Encounter (HOSPITAL_COMMUNITY): Payer: Managed Care, Other (non HMO) | Admitting: Anesthesiology

## 2014-07-20 ENCOUNTER — Ambulatory Visit (HOSPITAL_COMMUNITY)
Admission: RE | Admit: 2014-07-20 | Discharge: 2014-07-20 | Disposition: A | Discharge status: Home / Self Care | Payer: Managed Care, Other (non HMO) | Source: Ambulatory Visit | Attending: Surgery | Admitting: Surgery

## 2014-07-20 ENCOUNTER — Encounter (HOSPITAL_COMMUNITY)
Admission: RE | Disposition: A | Discharge status: Home / Self Care | Payer: Self-pay | Source: Ambulatory Visit | Attending: Surgery

## 2014-07-20 ENCOUNTER — Encounter (HOSPITAL_COMMUNITY): Payer: Self-pay

## 2014-07-20 DIAGNOSIS — Z9049 Acquired absence of other specified parts of digestive tract: Secondary | ICD-10-CM | POA: Insufficient documentation

## 2014-07-20 DIAGNOSIS — Z923 Personal history of irradiation: Secondary | ICD-10-CM | POA: Insufficient documentation

## 2014-07-20 DIAGNOSIS — G4730 Sleep apnea, unspecified: Secondary | ICD-10-CM | POA: Insufficient documentation

## 2014-07-20 DIAGNOSIS — Z932 Ileostomy status: Secondary | ICD-10-CM | POA: Insufficient documentation

## 2014-07-20 DIAGNOSIS — F17210 Nicotine dependence, cigarettes, uncomplicated: Secondary | ICD-10-CM | POA: Diagnosis not present

## 2014-07-20 DIAGNOSIS — Z85048 Personal history of other malignant neoplasm of rectum, rectosigmoid junction, and anus: Secondary | ICD-10-CM | POA: Diagnosis not present

## 2014-07-20 DIAGNOSIS — Z888 Allergy status to other drugs, medicaments and biological substances status: Secondary | ICD-10-CM | POA: Insufficient documentation

## 2014-07-20 DIAGNOSIS — Z79899 Other long term (current) drug therapy: Secondary | ICD-10-CM | POA: Insufficient documentation

## 2014-07-20 DIAGNOSIS — R1902 Left upper quadrant abdominal swelling, mass and lump: Secondary | ICD-10-CM | POA: Insufficient documentation

## 2014-07-20 DIAGNOSIS — C20 Malignant neoplasm of rectum: Secondary | ICD-10-CM | POA: Diagnosis present

## 2014-07-20 HISTORY — PX: ILEOSTOMY CLOSURE: SHX1784

## 2014-07-20 SURGERY — CLOSURE, ILEOSTOMY
Anesthesia: General

## 2014-07-20 MED ORDER — HEPARIN SODIUM (PORCINE) 5000 UNIT/ML IJ SOLN
5000.0000 [IU] | Freq: Once | INTRAMUSCULAR | Status: AC
Start: 2014-07-20 — End: 2014-07-20
  Administered 2014-07-20: 5000 [IU] via SUBCUTANEOUS
  Filled 2014-07-20: qty 1

## 2014-07-20 MED ORDER — BUPIVACAINE-EPINEPHRINE (PF) 0.25% -1:200000 IJ SOLN
INTRAMUSCULAR | Status: AC
  Filled 2014-07-20: qty 60

## 2014-07-20 MED ORDER — HYDROCODONE-ACETAMINOPHEN 5-325 MG PO TABS: 1.0000 | ORAL_TABLET | ORAL | Status: DC | PRN

## 2014-07-20 MED ORDER — FENTANYL CITRATE 0.05 MG/ML IJ SOLN
INTRAMUSCULAR | Status: AC
  Filled 2014-07-20: qty 5

## 2014-07-20 MED ORDER — DEXAMETHASONE SODIUM PHOSPHATE 10 MG/ML IJ SOLN
INTRAMUSCULAR | Status: AC
  Filled 2014-07-20: qty 1

## 2014-07-20 MED ORDER — LACTATED RINGERS IR SOLN
Status: DC | PRN
  Administered 2014-07-20: 1000 mL

## 2014-07-20 MED ORDER — PROPOFOL 10 MG/ML IV BOLUS
INTRAVENOUS | Status: DC | PRN
  Administered 2014-07-20: 200 mg via INTRAVENOUS
  Administered 2014-07-20: 25 mg via INTRAVENOUS

## 2014-07-20 MED ORDER — NEOSTIGMINE METHYLSULFATE 10 MG/10ML IV SOLN
INTRAVENOUS | Status: DC | PRN
  Administered 2014-07-20: 5 mg via INTRAVENOUS

## 2014-07-20 MED ORDER — PROMETHAZINE HCL 25 MG/ML IJ SOLN: 6.2500 mg | INTRAMUSCULAR | Status: DC | PRN

## 2014-07-20 MED ORDER — CISATRACURIUM BESYLATE 20 MG/10ML IV SOLN
INTRAVENOUS | Status: AC
  Filled 2014-07-20: qty 10

## 2014-07-20 MED ORDER — ALVIMOPAN 12 MG PO CAPS
12.0000 mg | ORAL_CAPSULE | Freq: Once | ORAL | Status: AC
  Administered 2014-07-20: 12 mg via ORAL
  Filled 2014-07-20: qty 1

## 2014-07-20 MED ORDER — BUPIVACAINE-EPINEPHRINE 0.25% -1:200000 IJ SOLN
INTRAMUSCULAR | Status: AC
  Filled 2014-07-20: qty 1

## 2014-07-20 MED ORDER — FENTANYL CITRATE 0.05 MG/ML IJ SOLN: 25.0000 ug | INTRAMUSCULAR | Status: DC | PRN

## 2014-07-20 MED ORDER — MIDAZOLAM HCL 5 MG/5ML IJ SOLN
INTRAMUSCULAR | Status: DC | PRN
  Administered 2014-07-20 (×2): 1 mg via INTRAVENOUS

## 2014-07-20 MED ORDER — SODIUM CHLORIDE 0.9 % IJ SOLN
INTRAMUSCULAR | Status: AC
Start: 2014-07-20 — End: 2014-07-20
  Filled 2014-07-20: qty 10

## 2014-07-20 MED ORDER — CISATRACURIUM BESYLATE (PF) 10 MG/5ML IV SOLN
INTRAVENOUS | Status: DC | PRN
  Administered 2014-07-20: 6 mg via INTRAVENOUS
  Administered 2014-07-20: 10 mg via INTRAVENOUS

## 2014-07-20 MED ORDER — EPHEDRINE SULFATE 50 MG/ML IJ SOLN
INTRAMUSCULAR | Status: DC | PRN
  Administered 2014-07-20 (×2): 5 mg via INTRAVENOUS

## 2014-07-20 MED ORDER — PROPOFOL 10 MG/ML IV BOLUS
INTRAVENOUS | Status: AC
  Filled 2014-07-20: qty 20

## 2014-07-20 MED ORDER — EPHEDRINE SULFATE 50 MG/ML IJ SOLN
INTRAMUSCULAR | Status: AC
  Filled 2014-07-20: qty 1

## 2014-07-20 MED ORDER — ONDANSETRON HCL 4 MG/2ML IJ SOLN
INTRAMUSCULAR | Status: DC | PRN
  Administered 2014-07-20 (×2): 2 mg via INTRAVENOUS

## 2014-07-20 MED ORDER — PHENYLEPHRINE HCL 10 MG/ML IJ SOLN
10.0000 mg | INTRAMUSCULAR | Status: DC | PRN
  Administered 2014-07-20: 40 ug/min via INTRAVENOUS

## 2014-07-20 MED ORDER — LACTATED RINGERS IV SOLN
INTRAVENOUS | Status: DC | PRN
  Administered 2014-07-20 (×3): via INTRAVENOUS

## 2014-07-20 MED ORDER — ONDANSETRON HCL 4 MG/2ML IJ SOLN
INTRAMUSCULAR | Status: AC
  Filled 2014-07-20: qty 2

## 2014-07-20 MED ORDER — LIDOCAINE HCL (CARDIAC) 20 MG/ML IV SOLN
INTRAVENOUS | Status: AC
  Filled 2014-07-20: qty 5

## 2014-07-20 MED ORDER — PHENYLEPHRINE HCL 10 MG/ML IJ SOLN
INTRAMUSCULAR | Status: DC | PRN
  Administered 2014-07-20 (×4): 40 ug via INTRAVENOUS

## 2014-07-20 MED ORDER — SUCCINYLCHOLINE CHLORIDE 20 MG/ML IJ SOLN
INTRAMUSCULAR | Status: DC | PRN
  Administered 2014-07-20: 140 mg via INTRAVENOUS

## 2014-07-20 MED ORDER — ATROPINE SULFATE 0.4 MG/ML IJ SOLN
INTRAMUSCULAR | Status: AC
  Filled 2014-07-20: qty 1

## 2014-07-20 MED ORDER — FENTANYL CITRATE 0.05 MG/ML IJ SOLN
INTRAMUSCULAR | Status: DC | PRN
  Administered 2014-07-20 (×2): 50 ug via INTRAVENOUS
  Administered 2014-07-20: 25 ug via INTRAVENOUS
  Administered 2014-07-20: 100 ug via INTRAVENOUS

## 2014-07-20 MED ORDER — MIDAZOLAM HCL 2 MG/2ML IJ SOLN
INTRAMUSCULAR | Status: AC
  Filled 2014-07-20: qty 2

## 2014-07-20 MED ORDER — DEXTROSE 5 % IV SOLN
2.0000 g | INTRAVENOUS | Status: AC
  Administered 2014-07-20: 2 g via INTRAVENOUS

## 2014-07-20 MED ORDER — 0.9 % SODIUM CHLORIDE (POUR BTL) OPTIME
TOPICAL | Status: DC | PRN
  Administered 2014-07-20: 1000 mL

## 2014-07-20 MED ORDER — DEXAMETHASONE SODIUM PHOSPHATE 10 MG/ML IJ SOLN
INTRAMUSCULAR | Status: DC | PRN
  Administered 2014-07-20: 10 mg via INTRAVENOUS

## 2014-07-20 MED ORDER — LIDOCAINE HCL (CARDIAC) 20 MG/ML IV SOLN
INTRAVENOUS | Status: DC | PRN
  Administered 2014-07-20: 80 mg via INTRAVENOUS

## 2014-07-20 MED ORDER — CEFOTETAN DISODIUM-DEXTROSE 2-2.08 GM-% IV SOLR
INTRAVENOUS | Status: AC
  Filled 2014-07-20: qty 50

## 2014-07-20 MED ORDER — GLYCOPYRROLATE 0.2 MG/ML IJ SOLN
INTRAMUSCULAR | Status: DC | PRN
  Administered 2014-07-20: .6 mg via INTRAVENOUS

## 2014-07-20 MED ORDER — BUPIVACAINE-EPINEPHRINE 0.25% -1:200000 IJ SOLN
INTRAMUSCULAR | Status: DC | PRN
  Administered 2014-07-20: 25 mL

## 2014-07-20 SURGICAL SUPPLY — 51 items
BLADE EXTENDED COATED 6.5IN (ELECTRODE) IMPLANT
BLADE HEX COATED 2.75 (ELECTRODE) ×6 IMPLANT
BLADE SURG SZ10 CARB STEEL (BLADE) ×3 IMPLANT
CABLE HIGH FREQUENCY MONO STRZ (ELECTRODE) ×3 IMPLANT
CANISTER SUCTION 2500CC (MISCELLANEOUS) ×3 IMPLANT
CATH KIT ON Q 7.5IN SLV (PAIN MANAGEMENT) IMPLANT
COUNTER NEEDLE 20 DBL MAG RED (NEEDLE) ×3 IMPLANT
COVER MAYO STAND STRL (DRAPES) ×6 IMPLANT
DRAIN CHANNEL 19F RND (DRAIN) IMPLANT
DRAPE LAPAROSCOPIC ABDOMINAL (DRAPES) ×3 IMPLANT
DRAPE SHEET LG 3/4 BI-LAMINATE (DRAPES) ×3 IMPLANT
DRAPE UTILITY XL STRL (DRAPES) ×6 IMPLANT
DRAPE WARM FLUID 44X44 (DRAPE) ×3 IMPLANT
DRSG OPSITE POSTOP 4X10 (GAUZE/BANDAGES/DRESSINGS) IMPLANT
DRSG OPSITE POSTOP 4X6 (GAUZE/BANDAGES/DRESSINGS) IMPLANT
DRSG OPSITE POSTOP 4X8 (GAUZE/BANDAGES/DRESSINGS) IMPLANT
DRSG TEGADERM 2-3/8X2-3/4 SM (GAUZE/BANDAGES/DRESSINGS) ×3 IMPLANT
ELECT REM PT RETURN 9FT ADLT (ELECTROSURGICAL) ×3
ELECTRODE REM PT RTRN 9FT ADLT (ELECTROSURGICAL) ×1 IMPLANT
GAUZE SPONGE 2X2 8PLY STRL LF (GAUZE/BANDAGES/DRESSINGS) ×1 IMPLANT
GAUZE SPONGE 4X4 12PLY STRL (GAUZE/BANDAGES/DRESSINGS) ×3 IMPLANT
GLOVE ECLIPSE 8.0 STRL XLNG CF (GLOVE) ×6 IMPLANT
GLOVE INDICATOR 8.0 STRL GRN (GLOVE) ×6 IMPLANT
GOWN STRL REUS W/TWL XL LVL3 (GOWN DISPOSABLE) ×12 IMPLANT
KIT BASIN OR (CUSTOM PROCEDURE TRAY) ×3 IMPLANT
LEGGING LITHOTOMY PAIR STRL (DRAPES) ×3 IMPLANT
LIGASURE IMPACT 36 18CM CVD LR (INSTRUMENTS) IMPLANT
PACK GENERAL/GYN (CUSTOM PROCEDURE TRAY) ×3 IMPLANT
PENCIL BUTTON HOLSTER BLD 10FT (ELECTRODE) ×3 IMPLANT
SPONGE GAUZE 2X2 STER 10/PKG (GAUZE/BANDAGES/DRESSINGS) ×2
STAPLER VISISTAT 35W (STAPLE) ×3 IMPLANT
SUCTION POOLE TIP (SUCTIONS) ×3 IMPLANT
SUT NOV 1 T60/GS (SUTURE) IMPLANT
SUT NOVA NAB DX-16 0-1 5-0 T12 (SUTURE) IMPLANT
SUT NOVA T20/GS 25 (SUTURE) IMPLANT
SUT PDS AB 1 CTX 36 (SUTURE) IMPLANT
SUT SILK 2 0 (SUTURE) ×2
SUT SILK 2 0SH CR/8 30 (SUTURE) ×3 IMPLANT
SUT SILK 2-0 18XBRD TIE 12 (SUTURE) ×1 IMPLANT
SUT SILK 3 0 (SUTURE) ×2
SUT SILK 3 0 SH CR/8 (SUTURE) ×3 IMPLANT
SUT SILK 3-0 18XBRD TIE 12 (SUTURE) ×1 IMPLANT
SUT VIC AB 2-0 SH 18 (SUTURE) ×3 IMPLANT
SUT VIC AB 3-0 SH 18 (SUTURE) ×3 IMPLANT
SUT VICRYL 2 0 18  UND BR (SUTURE) ×2
SUT VICRYL 2 0 18 UND BR (SUTURE) ×1 IMPLANT
TOWEL OR 17X26 10 PK STRL BLUE (TOWEL DISPOSABLE) ×6 IMPLANT
TOWEL OR NON WOVEN STRL DISP B (DISPOSABLE) ×6 IMPLANT
TRAY FOLEY CATH 14FRSI W/METER (CATHETERS) ×3 IMPLANT
TUNNELER SHEATH ON-Q 16GX12 DP (PAIN MANAGEMENT) IMPLANT
YANKAUER SUCT BULB TIP 10FT TU (MISCELLANEOUS) ×3 IMPLANT

## 2014-07-20 NOTE — Progress Notes (Signed)
IV dced. Cath intact. Pressure dsg applied ( Not charted in HOLDING)

## 2014-07-20 NOTE — Progress Notes (Signed)
Ambulated to BR. Tolerated well. Denies dizziness

## 2014-07-20 NOTE — Anesthesia Postprocedure Evaluation (Signed)
  Anesthesia Post-op Note  Patient: Stephen Costa  Procedure(s) Performed: Procedure(s) (LRB):  EXAM UNDER ANESTHESIA, DIAGNOSTIC LAPAROSCOPY LYSIS OF ADHESIONS (N/A)  Patient Location: PACU  Anesthesia Type: General  Level of Consciousness: awake and alert   Airway and Oxygen Therapy: Patient Spontanous Breathing  Post-op Pain: mild  Post-op Assessment: Post-op Vital signs reviewed, Patient's Cardiovascular Status Stable, Respiratory Function Stable, Patent Airway and No signs of Nausea or vomiting  Last Vitals:  Filed Vitals:   07/20/14 1100  BP: 138/78  Pulse: 89  Temp: 36.3 C  Resp: 16    Post-op Vital Signs: stable   Complications: No apparent anesthesia complications

## 2014-07-20 NOTE — Discharge Instructions (Signed)
GETTING TO GOOD BOWEL HEALTH. Irregular bowel habits such as constipation and diarrhea can lead to many problems over time.  Having one soft bowel movement a day is the most important way to prevent further problems.  The anorectal canal is designed to handle stretching and feces to safely manage our ability to get rid of solid waste (feces, poop, stool) out of our body.  BUT, hard constipated stools can act like ripping concrete bricks and diarrhea can be a burning fire to this very sensitive area of our body, causing inflamed hemorrhoids, anal fissures, increasing risk is perirectal abscesses, abdominal pain/bloating, an making irritable bowel worse.     The goal: ONE SOFT BOWEL MOVEMENT A DAY!  To have soft, regular bowel movements:    Drink at least 8 tall glasses of water a day.     Take plenty of fiber.  Fiber is the undigested part of plant food that passes into the colon, acting s natures broom to encourage bowel motility and movement.  Fiber can absorb and hold large amounts of water. This results in a larger, bulkier stool, which is soft and easier to pass. Work gradually over several weeks up to 6 servings a day of fiber (25g a day even more if needed) in the form of: o Vegetables -- Root (potatoes, carrots, turnips), leafy green (lettuce, salad greens, celery, spinach), or cooked high residue (cabbage, broccoli, etc) o Fruit -- Fresh (unpeeled skin & pulp), Dried (prunes, apricots, cherries, etc ),  or stewed ( applesauce)  o Whole grain breads, pasta, etc (whole wheat)  o Bran cereals    Bulking Agents -- This type of water-retaining fiber generally is easily obtained each day by one of the following:  o Psyllium bran -- The psyllium plant is remarkable because its ground seeds can retain so much water. This product is available as Metamucil, Konsyl, Effersyllium, Per Diem Fiber, or the less expensive generic preparation in drug and health food stores. Although labeled a laxative, it really  is not a laxative.  o Methylcellulose -- This is another fiber derived from wood which also retains water. It is available as Citrucel. o Polyethylene Glycol - and artificial fiber commonly called Miralax or Glycolax.  It is helpful for people with gassy or bloated feelings with regular fiber o Flax Seed - a less gassy fiber than psyllium   No reading or other relaxing activity while on the toilet. If bowel movements take longer than 5 minutes, you are too constipated   AVOID CONSTIPATION.  High fiber and water intake usually takes care of this.  Sometimes a laxative is needed to stimulate more frequent bowel movements, but    Laxatives are not a good long-term solution as it can wear the colon out. o Osmotics (Milk of Magnesia, Fleets phosphosoda, Magnesium citrate, MiraLax, GoLytely) are safer than  o Stimulants (Senokot, Castor Oil, Dulcolax, Ex Lax)    o Do not take laxatives for more than 7days in a row.    IF SEVERELY CONSTIPATED, try a Bowel Retraining Program: o Do not use laxatives.  o Eat a diet high in roughage, such as bran cereals and leafy vegetables.  o Drink six (6) ounces of prune or apricot juice each morning.  o Eat two (2) large servings of stewed fruit each day.  o Take one (1) heaping tablespoon of a psyllium-based bulking agent twice a day. Use sugar-free sweetener when possible to avoid excessive calories.  o Eat a normal breakfast.  o  Set aside 15 minutes after breakfast to sit on the toilet, but do not strain to have a bowel movement.  o If you do not have a bowel movement by the third day, use an enema and repeat the above steps.    Controlling diarrhea o Switch to liquids and simpler foods for a few days to avoid stressing your intestines further. o Avoid dairy products (especially milk & ice cream) for a short time.  The intestines often can lose the ability to digest lactose when stressed. o Avoid foods that cause gassiness or bloating.  Typical foods include  beans and other legumes, cabbage, broccoli, and dairy foods.  Every person has some sensitivity to other foods, so listen to our body and avoid those foods that trigger problems for you. o Adding fiber (Citrucel, Metamucil, psyllium, Miralax) gradually can help thicken stools by absorbing excess fluid and retrain the intestines to act more normally.  Slowly increase the dose over a few weeks.  Too much fiber too soon can backfire and cause cramping & bloating. o Probiotics (such as active yogurt, Align, etc) may help repopulate the intestines and colon with normal bacteria and calm down a sensitive digestive tract.  Most studies show it to be of mild help, though, and such products can be costly. o Medicines:   Bismuth subsalicylate (ex. Kayopectate, Pepto Bismol) every 30 minutes for up to 6 doses can help control diarrhea.  Avoid if pregnant.   Loperamide (Immodium) can slow down diarrhea.  Start with two tablets (4mg  total) first and then try one tablet every 6 hours.  Avoid if you are having fevers or severe pain.  If you are not better or start feeling worse, stop all medicines and call your doctor for advice o Call your doctor if you are getting worse or not better.  Sometimes further testing (cultures, endoscopy, X-ray studies, bloodwork, etc) may be needed to help diagnose and treat the cause of the diarrhea.  ABDOMINAL SURGERY: POST OP INSTRUCTIONS  1. DIET: Follow a light bland diet the first 24 hours after arrival home, such as soup, liquids, crackers, etc.  Be sure to include lots of fluids daily.  Avoid fast food or heavy meals as your are more likely to get nauseated.  Eat a low fat the next few days after surgery.   2. Take your usually prescribed home medications unless otherwise directed. 3. PAIN CONTROL: a. Pain is best controlled by a usual combination of three different methods TOGETHER: i. Ice/Heat ii. Over the counter pain medication iii. Prescription pain medication b. Most  patients will experience some swelling and bruising around the incisions.  Ice packs or heating pads (30-60 minutes up to 6 times a day) will help. Use ice for the first few days to help decrease swelling and bruising, then switch to heat to help relax tight/sore spots and speed recovery.  Some people prefer to use ice alone, heat alone, alternating between ice & heat.  Experiment to what works for you.  Swelling and bruising can take several weeks to resolve.   c. It is helpful to take an over-the-counter pain medication regularly for the first few weeks.  Choose one of the following that works best for you: i. Naproxen (Aleve, etc)  Two 220mg  tabs twice a day ii. Ibuprofen (Advil, etc) Three 200mg  tabs four times a day (every meal & bedtime) iii. Acetaminophen (Tylenol, etc) 500-650mg  four times a day (every meal & bedtime) d. A  prescription for pain  medication (such as oxycodone, hydrocodone, etc) should be given to you upon discharge.  Take your pain medication as prescribed.  i. If you are having problems/concerns with the prescription medicine (does not control pain, nausea, vomiting, rash, itching, etc), please call us [phone] to see if we need to switch you to a different pain medicine that will work better for you and/or control your side effect better. ii. If you need a refill on your pain medication, please contact your pharmacy.  They will contact our office to request authorization. Prescriptions will not be filled after 5 pm or on week-ends. 4. Avoid getting constipated.  Between the surgery and the pain medications, it is common to experience some constipation.  Increasing fluid intake and taking a fiber supplement (such as Metamucil, Citrucel, FiberCon, MiraLax, etc) 1-2 times a day regularly will usually help prevent this problem from occurring.  A mild laxative (prune juice, Milk of Magnesia, MiraLax, etc) should be taken according to package directions if there are no bowel  movements after 48 hours.   5. Watch out for diarrhea.  If you have many loose bowel movements, simplify your diet to bland foods & liquids for a few days.  Stop any stool softeners and decrease your fiber supplement.  Switching to mild anti-diarrheal medications (Kayopectate, Pepto Bismol) can help.  If this worsens or does not improve, please call us. 6. Wash / shower every day.  You may shower over the incision / wound.  Avoid baths until the skin is fully healed.  Continue to shower over incision(s) after the dressing is off. 7. Remove your waterproof bandages 5 days after surgery.  You may leave the incision open to air.  You may replace a dressing/Band-Aid to cover the incision for comfort if you wish. 8. ACTIVITIES as tolerated:   a. You may resume regular (light) daily activities beginning the next day--such as daily self-care, walking, climbing stairs--gradually increasing activities as tolerated.  If you can walk 30 minutes without difficulty, it is safe to try more intense activity such as jogging, treadmill, bicycling, low-impact aerobics, swimming, etc. b. Save the most intensive and strenuous activity for last such as sit-ups, heavy lifting, contact sports, etc  Refrain from any heavy lifting or straining until you are off narcotics for pain control.   c. DO NOT PUSH THROUGH PAIN.  Let pain be your guide: If it hurts to do something, don't do it.  Pain is your body warning you to avoid that activity for another week until the pain goes down. d. You may drive when you are no longer taking prescription pain medication, you can comfortably wear a seatbelt, and you can safely maneuver your car and apply brakes. e. Dennis Bast may have sexual intercourse when it is comfortable.  9. FOLLOW UP in our office a. Please call [facility] at [phone] to set up an appointment to see your surgeon in the office for a follow-up appointment approximately 1-2 weeks after your surgery. b. Make sure that you call for  this appointment the day you arrive home to insure a convenient appointment time. 10. IF YOU HAVE DISABILITY OR FAMILY LEAVE FORMS, BRING THEM TO THE OFFICE FOR PROCESSING.  DO NOT GIVE THEM TO YOUR DOCTOR.   WHEN TO CALL us [phone]: 1. Poor pain control 2. Reactions / problems with new medications (rash/itching, nausea, etc)  3. Fever over 101.5 F (38.5 C) 4. Inability to urinate 5. Nausea and/or vomiting 6. Worsening swelling or bruising  7. Continued bleeding from incision. 8. Increased pain, redness, or drainage from the incision  The clinic staff is available to answer your questions during regular business hours (8:30am-5pm).  Please dont hesitate to call and ask to speak to one of our nurses for clinical concerns.   A surgeon from [facility] is always on call at the hospitals   If you have a medical emergency, go to the nearest emergency room or call 911.    [facility], [place], [place], [place], [place]  [address] ? MAIN: [phone] ? TOLL FREE: [phone] ? FAX [phone] [email or web address]   Managing Pain  Pain after surgery or related to activity is often due to strain/injury to muscle, tendon, nerves and/or incisions.  This pain is usually short-term and will improve in a few months.   Many people find it helpful to do the following things TOGETHER to help speed the process of healing and to get back to regular activity more quickly:  1. Avoid heavy physical activity a.  no lifting greater than 20 pounds b. Do not push through the pain.  Listen to your body and avoid positions and maneuvers than reproduce the pain c. Walking is okay as tolerated, but go slowly and stop when getting sore.  d. Remember: If it hurts to do it, then dont do it! 2. Take Anti-inflammatory medication  a. Take with food/snack around the clock for 1-2 weeks i. This helps the muscle and nerve tissues become less  irritable and calm down faster b. Choose ONE of the following over-the-counter medications: i. Naproxen 220mg  tabs (ex. Aleve) 1-2 pills twice a day  ii. Ibuprofen 200mg  tabs (ex. Advil, Motrin) 3-4 pills with every meal and just before bedtime iii. Acetaminophen 500mg  tabs (Tylenol) 1-2 pills with every meal and just before bedtime 3. Use a Heating pad or Ice/Cold Pack a. 4-6 times a day b. May use warm bath/hottub  or showers 4. Try Gentle Massage and/or Stretching  a. at the area of pain many times a day b. stop if you feel pain - do not overdo it  Try these steps together to help you body heal faster and avoid making things get worse.  Doing just one of these things may not be enough.    If you are not getting better after two weeks or are noticing you are getting worse, contact our office for further advice; we may need to re-evaluate you & see what other things we can do to help.  Colorectal Cancer Colorectal cancer is an abnormal growth of tissue (tumor) in the colon or rectum that is cancerous (malignant). Unlike noncancerous (benign) tumors, malignant tumors can spread to other parts of your body. The colon is the large bowel or large intestine. The rectum is the last several inches of the colon.  RISK FACTORS The exact cause of colorectal cancer is unknown. However, the following factors may increase your chances of getting colorectal cancer:   Age older than 81 years.   Abnormal growths (polyps) on the inner wall of the colon or rectum.   Diabetes.   African American race.   Family history of hereditary nonpolyposis colorectal cancer. This condition is caused by changes in the genes that are responsible for repairing mismatched DNA.   Personal history of cancer. A person who has already had colorectal cancer may develop it a second time. Also, women with a history of ovarian, uterine, or breast cancer are at a  somewhat higher risk of developing colorectal  cancer.  Certain hereditary conditions.  Eating a diet that is high in fat (especially animal fat) and low in fiber, fruits, and vegetables.  Sedentary lifestyle.  Inflammatory bowel disease, including ulcerative colitis and Crohn's disease.   Smoking.   Excessive alcohol use.  SYMPTOMS Early colorectal cancer often does not cause symptoms. As the cancer grows, symptoms may include:   Changes in bowel habits.  Diarrhea.   Constipation.   Feeling like the bowel does not empty completely after a bowel movement.   Blood in the stool.   Stools that are narrower than usual.   Abdominal discomfort, pain, bloating, fullness, or cramps.  Frequent gas pain.   Unexplained weight loss.   Constant tiredness.   Nausea and vomiting.  DIAGNOSIS  Your health care provider will ask about your medical history. He or she may also perform a number of procedures, such as:   A physical exam.  A digital rectal exam.  A fecal occult blood test.  A barium enema.  Blood tests.   X-rays.   Imaging tests, such as CT scans or MRIs.   Taking a tissue sample (biopsy) from your colon or rectum to look for cancer cells.   A sigmoidoscopy to view the inside of the last part of your colon.   A colonoscopy to view the inside of your entire colon.   An endorectal ultrasound to see how deep a rectal tumor has grown and whether the cancer has spread to lymph nodes or other nearby tissues.  Your cancer will be staged to determine its severity and extent. Staging is a careful attempt to find out the size of the tumor, whether the cancer has spread, and if so, to what parts of the body. You may need to have more tests to determine the stage of your cancer. The test results will help determine what treatment plan is best for you.   Stage 0. The cancer is found only in the innermost lining of the colon or rectum.   Stage I. The cancer has grown into the inner wall of the  colon or rectum. The cancer has not yet reached the outer wall of the colon.   Stage II. The cancer extends more deeply into or through the wall of the colon or rectum. It may have invaded nearby tissue, but cancer cells have not spread to the lymph nodes.   Stage III. The cancer has spread to nearby lymph nodes but not to other parts of the body.   Stage IV. The cancer has spread to other parts of the body, such as the liver or lungs.  Your health care provider may tell you the detailed stage of your cancer, which includes both a number and a letter.  TREATMENT  Depending on the type and stage, colorectal cancer may be treated with surgery, radiation therapy, chemotherapy, targeted therapy, or radiofrequency ablation. Some people have a combination of these therapies. Surgery may be done to remove the polyps from your colon. In early stages, your health care provider may be able to do this during a colonoscopy. In later stages, surgery may be done to remove part of your colon.  HOME CARE INSTRUCTIONS   Take medicines only as directed by your health care provider.   Maintain a healthy diet.   Consider joining a support group. This may help you learn to cope with the stress of having colorectal cancer.   Seek advice to help you  manage treatment of side effects.   Keep all follow-up visits as directed by your health care provider.   Inform your cancer specialist if you are admitted to the hospital.  SEEK MEDICAL CARE IF:  Your diarrhea or constipation does not go away.   Your bowel habits change.  You have increased abdominal pain.   You notice new fatigue or weakness.  You lose weight. Document Released: 09/15/2005 Document Revised: 01/30/2014 Document Reviewed: 03/10/2013 [facility] Patient Information 2015 [place], [place]. This information is not intended to replace advice given to you by your health care provider. Make sure you discuss any questions you have with your  health care provider.

## 2014-07-20 NOTE — Op Note (Addendum)
07/20/2014  9:33 AM  PATIENT:  Stephen Costa  58 y.o. 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  INDICATION: Pleasant morbidly obese male with bulky mid rectal cancer.  Underwent neoadjuvant chemoradiation therapy.  Initially attempted minimally invasive resections but due to his smoking and ventilation could not be done safely so converted to open low anterior resection with diverting loop ileostomy 02/21/2014.   Staged his wife [id number] (0/16 lymph nodes).  Distal margin 2.5 cm.  Margins negative.  Underwent post adjuvant chemoradiation therapy.  Completed about a month ago.  CT scan showed some stranding in left upper quadrant omentum and pelvis.  I made recommendation for  diagnostic laparoscopy, examination under seizure, probable loop ileostomy takedown.  Risks benefits alternatives discussed.  He agreed to proceed.  OR FINDINGS: Moderate adhesions of omentum to anterior abdominal wall.  Soft without nodularity.    Colorectal anastomosis 4-5cm from anal verge with some rectal wall firmness diffuse.  No stricture  Firm bulky inflamed fatty region in omentum of left upper quadrant 6x8cm adherent to anterior abdominal wall just inferior to old splenic flexure.  Frozen wedge biopsy of medial edge shows adenocarcinoma.  DESCRIPTION: Informed consent was confirmed.  The patient underwent general anaesthesia without difficulty.  The patient was positioned appropriately.  VTE prevention in place.  The patient's abdomen was clipped, prepped, & draped in a sterile fashion.  Surgical timeout confirmed our plan.  The patient was positioned in reverse Trendelenburg.  Abdominal entry with a 27mm laparoscopic port was gained using optical entry technique in the right upper abdomen.  Entry was clean.  I induced carbon dioxide insufflation.  Extra ports were carefully placed under direct laparoscopic visualization.  Hemostasis ensured on the liver edge.    The patient had moderate adhesions of omentum to the anterior abdominal wall.  Procedure 3 dose of at the base of the falciform ligament midline and left upper quadrant.  Did [place] more firm inflamed mass in the left upper quadrant within the greater omentum.  I used the scissors to cut a specimen off without cautery.  Sent that for frozen specimen.  I ensured hemostasis.  I was able to free lower omental adhesions in the pelvis.  Could see the colon down to the deep pelvis.  I held off doing anything more aggressive.  I  did free some adhesions around the loop ileostomy.    Pathology called and Stephen Costa noted that the specimen was positive for adenocarcinoma, unfortunately.  I therefore decided to abort loop ileostomy takedown.   Carbon dioxide was evacuated.  Final ports removed.  Port sites were closed with Monocryl stitch.  Sterile dressing applied.  Pt extubated & stable in the recovery room.  I called Stephen. Julieanne Costa, his medical oncologist.  I think the plan would be to have him see Stephen Stephen Costa at [facility] to see if he is a candidate for debulking intraperitoneal chemotherapy.  I called our office to help get an appt with Surgical Oncology at [facility].  Stephen. Benay Costa will try to see the patient in the office soon as well.  I discussed intraoperative findings & plans with the patient's wife and then to the patient himself.  I made recommendations.  I answered questions.  They are upset but agree with recommendations to get more information & consultation to see what the next best course / options are.  Understanding & appreciation was expressed.   Stephen Costa, M.D., F.A.C.S. Gastrointestinal and Minimally Invasive Surgery [facility] [address] [place], [place] [address] [phone] Main / Paging

## 2014-07-20 NOTE — Anesthesia Procedure Notes (Signed)
Procedure Name: Intubation Date/Time: 07/20/2014 7:53 AM Performed by: Ofilia Neas Pre-anesthesia Checklist: Timeout performed, Patient identified, Emergency Drugs available, Suction available and Patient being monitored Patient Re-evaluated:Patient Re-evaluated prior to inductionOxygen Delivery Method: Circle system utilized Preoxygenation: Pre-oxygenation with 100% oxygen Intubation Type: IV induction Ventilation: Mask ventilation without difficulty Laryngoscope Size: Mac and 4 Grade View: Grade II Tube type: Glide rite Tube size: 7.5 mm Number of attempts: 1 Airway Equipment and Method: Video-laryngoscopy (elective glidescope due to past intubation hx) Placement Confirmation: ETT inserted through vocal cords under direct vision,  positive ETCO2 and breath sounds checked- equal and bilateral Secured at: 21 cm Tube secured with: Tape Dental Injury: Teeth and Oropharynx as per pre-operative assessment  Difficulty Due To: Difficult Airway- due to limited oral opening, Difficult Airway- due to reduced neck mobility, Difficult Airway- due to large tongue, Difficulty was unanticipated, Difficult Airway- due to immobile epiglottis and Difficult Airway- due to anterior larynx Future Recommendations: Recommend- induction with short-acting agent, and alternative techniques readily available

## 2014-07-20 NOTE — H&P (Signed)
[facility]  [address] [place], [place] [address] Phone: [phone] FAX: [phone]     Page Lancon  09/05/1956 [id number]  CARE TEAM:  PCP: Lynne Logan, MD  Outpatient Care Team: Patient Care Team: Lynne Logan, MD as PCP - General (Family Medicine) [name], MD as Consulting Physician (Gastroenterology) Michael Boston, MD as Consulting Physician (General Surgery) Ladell Pier, MD as Consulting Physician (Oncology) Marye Round, MD as Consulting Physician (Radiation Oncology) Carola Frost, RN as Registered Nurse  Inpatient Treatment Team: Treatment Team: Attending Provider: Michael Boston, MD  This patient is a 58 y.o.male who presents today for surgical evaluation.   Reason for evaluation: ostomy takedown  Patient returns today feeling well.  CAT scan showed mild stranding left upper quadrant and pelvis.  Emptying bag well.  Wounds closed.  In good spirits.  For for colostomy takedown.  Energy level good.  Completed chemotherapy a month ago  Past Medical History  Diagnosis Date  . Rectal cancer 09/26/2013    ypT3ypN0 (0/16 LN)  . Hemorrhoids   . Cancer 10/05/13    rectum  . Allergy   . Arthritis     knees  . History of radiation therapy 10/31/13-12/08/13    rectal 50.4Gy total  . Rectal cancer   . Sleep apnea     doesn't wear CPAP usually    Past Surgical History  Procedure Laterality Date  . Lumbar disc surgery  1984, 1987  . Wrist surgery Right 2006  . Shoulder surgery Right 1999  . Surgery scrotal / testicular Left 58 y/o    respositioning into scrotum  . Eus N/A 10/05/2013    Procedure: LOWER ENDOSCOPIC ULTRASOUND (EUS);  Surgeon: [name], MD;  Location: [name];  Service: Endoscopy;  Laterality: N/A;  . Flexible sigmoidoscopy N/A 10/05/2013    Procedure: FLEXIBLE SIGMOIDOSCOPY;  Surgeon: [name], MD;  Location: [facility];  Service: Endoscopy;  Laterality: N/A;  .  Proctoscopy N/A 02/21/2014    Procedure: RIGID PROCTOSCOPY;  Surgeon: Adin Hector, MD;  Location: [facility];  Service: General;  Laterality: N/A;  . Low anterior bowel resection  02/21/2014  . 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 Marital Status: Married    Spouse Name: N/A    Number of Children: N/A  . 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 Smoking status: Former Smoker -- 1.00 packs/day for 40 years    Types: Cigarettes    Quit date: 02/21/2014  . Smokeless tobacco: Never Used  . Alcohol Use: No  . Drug Use: No  . Sexual Activity: Not on file   Other Topics Concern  . Not on file   Social History Narrative  . No narrative on file    Family History  Problem Relation Age of Onset  . Cancer Maternal Uncle     Throat Cancer  . Cancer Paternal Uncle     Colon Cancer    Current Facility-Administered Medications  Medication Dose Route Frequency Provider Last Rate Last Dose  . bupivacaine 0.25 % ON-Q pump DUAL CATH 300 mL  300 mL Other Continuous Michael Boston, MD      . cefoTEtan (CEFOTAN) 2 g in dextrose 5 % 50 mL IVPB  2 g Intravenous On Call to OR Michael Boston,  MD      . clindamycin (CLEOCIN) 900 mg, gentamicin (GARAMYCIN) 240 mg in sodium chloride 0.9 % 1,000 mL for intraperitoneal lavage   Intraperitoneal To OR Michael Boston, MD         Allergies  Allergen Reactions  . Oxycodone     MAKES PATIENT ANXIOUS & HYPER  . Tegaderm Ag Mesh [Silver]     Blisters    ROS: Constitutional:  No fevers, chills, sweats.  Weight stable Eyes:  No vision changes, No discharge HENT:  No sore throats, nasal drainage Lymph: No neck swelling, No bruising easily Pulmonary:  No cough, productive sputum CV: No orthopnea, PND  Patient walks 30 minutes for about 1 miles without difficulty.  No exertional chest/neck/shoulder/arm pain. GI:  No personal nor family history of  inflammatory bowel disease, irritable bowel syndrome, allergy such as Celiac Sprue, dietary/dairy problems, colitis, ulcers nor gastritis.  No recent sick contacts/gastroenteritis.  No travel outside the country.  No changes in diet. Renal: No UTIs, No hematuria Genital:  No drainage, bleeding, masses Musculoskeletal: No severe joint pain.  Good ROM major joints Skin:  No sores or lesions.  No rashes Heme/Lymph:  No easy bleeding.  No swollen lymph nodes Neuro: No focal weakness/numbness.  No seizures Psych: No suicidal ideation.  No hallucinations  BP 152/88  Pulse 106  Temp(Src) 97.6 F (36.4 C) (Oral)  Resp 16  Ht 5\' 7"  (1.702 m)  Wt 257 lb 8 oz (116.801 kg)  BMI 40.32 kg/m2  SpO2 95%  Physical Exam: General: Pt awake/alert/oriented x4 in no major acute distress Eyes: PERRL, normal EOM. Sclera nonicteric Neuro: CN II-XII intact w/o focal sensory/motor deficits. Lymph: No head/neck/groin lymphadenopathy Psych:  No delerium/psychosis/paranoia HENT: Normocephalic, Mucus membranes moist.  No thrush Neck: Supple, No tracheal deviation Chest: No pain.  Good respiratory excursion. CV:  Pulses intact.  Regular rhythm Abdomen: Soft, Nondistended.  Nontender.  No incarcerated hernias.  Obese but soft.  Ileostomy right lower quadrant pink. Ext:  SCDs BLE.  No significant edema.  No cyanosis Skin: No petechiae / purpurea.  No major sores Musculoskeletal: No severe joint pain.  Good ROM major joints   Results:   Labs: No results found for this or any previous visit (from the past 48 hour(s)).  Imaging / Studies: Ct Chest W Contrast  07/14/2014   CLINICAL DATA:  History of rectal cancer diagnosed in December 2014. Right upper lobe pulmonary nodule.  EXAM: CT CHEST WITH CONTRAST  TECHNIQUE: Multidetector CT imaging of the chest was performed during intravenous contrast administration.  CONTRAST:  65mL OMNIPAQUE IOHEXOL 300 MG/ML  SOLN  COMPARISON:  Chest x-ray dated 02/21/2014 and  chest CT dated 09/30/2013  FINDINGS: There is a stable 3 mm nodule in the right upper lobe seen on image 20 of series 3. The lungs are otherwise clear. There is no hilar or mediastinal adenopathy. There is a stable small node in the aortopulmonary window, 8 mm on the sagittal view. No osseous abnormality. Heart size is normal.  IMPRESSION: No significant abnormality. Stable 3 mm nodule in the right upper lobe. Follow-up CT scan without contrast in 1 year is recommended.   Electronically Signed   By: Rozetta Nunnery M.D.   On: 07/14/2014 17:02   Ct Abdomen Pelvis W Contrast  07/12/2014   CLINICAL DATA:  58 year old male with history of rectal cancer status post resection and diverting ileostomy, scheduled for ostomy a reversal next week. Status post chemotherapy and radiation therapy.  EXAM:  CT ABDOMEN AND PELVIS WITH CONTRAST  TECHNIQUE: Multidetector CT imaging of the abdomen and pelvis was performed using the standard protocol following bolus administration of intravenous contrast.  CONTRAST:  125 mL of Omnipaque 300.  COMPARISON:  CT of the chest, abdomen and pelvis 09/30/2013.  FINDINGS: Lower chest:  Unremarkable.  Hepatobiliary: Heterogeneous areas of low attenuation are noted throughout the hepatic parenchyma, most evident in segments 4B, 5 and 8, presumably areas of extensive hepatic steatosis. No discrete cystic or solid hepatic lesions are otherwise noted. No intra or extrahepatic biliary ductal dilatation. Gallbladder is normal in appearance.  Pancreas: Normal.  Spleen: Normal.  Adrenals/Urinary Tract: Bilateral adrenal glands and bilateral kidneys are normal in appearance. No hydroureteronephrosis or perinephric stranding to indicate urinary tract obstruction at this time. Urinary bladder is normal in appearance.  Stomach/Bowel: The appearance of the stomach is normal. No pathologic dilatation of the small bowel or colon. Status post distal rectal resection with primary anastomosis at the level of the  anorectal junction. No definite mass-like area in the low anatomic pelvis to suggest local recurrence of disease. There is extensive soft tissue stranding in the knees a rectal fat, presumably related to prior surgery and prior radiation therapy. Small amount of thickening of the presacral soft tissues is also noted, also likely treatment related. Right lower quadrant diverting ileostomy. Normal appendix.  Vascular/Lymphatic: Atherosclerosis throughout the abdominal and pelvic vasculature, without evidence of aneurysm or dissection. No definite pathologically enlarged lymph nodes are noted in the abdomen or pelvis.  Reproductive: Prostate gland is unremarkable in appearance.  Other: There is a soft tissue stranding in the left upper quadrant which appears to be associated with the omentum immediately beneath the splenic flexure, best demonstrated on images 33 through 50 of series 2. There is also a small amount of soft tissue stranding immediately anterior to the proximal transverse colon on image 36 of series 2. No significant volume of ascites. No pneumoperitoneum.  Musculoskeletal: There are no aggressive appearing lytic or blastic lesions noted in the visualized portions of the skeleton.  IMPRESSION: 1. Status post rectal resection with right lower quadrant diverting ileostomy. There post surgical and post procedural changes of radiation therapy in the low anatomic pelvis, as above, without definite evidence to suggest local recurrence of disease (although attention to these regions on follow-up studies is strongly recommended to ensure stability and resolution of these findings). However, there are some areas of soft tissue stranding associated with the omental fat in the upper abdomen, predominantly in the left upper quadrant immediately beneath the splenic flexure of the colon. At this time, these findings are very amorphous in appearance and are favored to represent areas of fat infarction in the omentum,  however, close attention on followup studies is recommended to exclude the possibility of peritoneal spread of disease. If there is planned surgical takedown of the ileostomy in the near future, direct inspection of these regions at the time of surgery is recommended if possible. 2. Heterogeneous hepatic steatosis. 3. Additional incidental findings, as above.   Electronically Signed   By: Vinnie Langton M.D.   On: 07/12/2014 17:07    Medications / Allergies: per chart  Antibiotics: Anti-infectives   Start     Dose/Rate Route Frequency Ordered Stop   07/20/14 0617  cefoTEtan (CEFOTAN) 2 g in dextrose 5 % 50 mL IVPB     2 g 100 mL/hr over 30 Minutes Intravenous On call to O.R. 07/20/14 0617 07/21/14 0559   07/20/14 0000  clindamycin (CLEOCIN) 900 mg, gentamicin (GARAMYCIN) 240 mg in sodium chloride 0.9 % 1,000 mL for intraperitoneal lavage    Comments:  Pharmacy may adjust dosing strength, schedule, rate of infusion, etc as needed to optimize therapy    Intraperitoneal To Surgery 07/19/14 1446 07/21/14 0000      Assessment  [name]  58 y.o. male  Day of Surgery  Procedure(s): LOOP ILEOSTOMY TAKEDOWN, EXAM UNDER ANESTHESIA, DIAGNOSTIC LAPAROSCOPY  Problem List:  Active Problems:   * No active hospital problems. *   Stage II rectal cancer improved after post adjuvant chemotherapy.  Desiring ileostomy takedown.  Plan:  Plan diagnostic laparoscopy to confirm no abnormalities in left upper quadrant or pelvis.  Not to likely in a T3 lesion down the pelvis that is node negative.  Check anastomosis in the lower rectum.  Then ileostomy takedown.  Discussed with him the technique and expected postoperative goals and recovery.  He agrees.  He is appreciative of all the caries from numerous specialists and staff members.  He does have a difficult airway but the anesthesia team is aware and has a plan.  Hopefully things will go well:  The anatomy & physiology of the digestive tract was  discussed.  The pathophysiology was discussed.  Possibility of remaining with an ostomy permanently was discussed.  I offered ostomy takedown.  Laparoscopic & open techniques were discussed.   Risks such as bleeding, infection, abscess, leak, reoperation, possible re-ostomy, injury to other organs, hernia, heart attack, death, and other risks were discussed.   I noted a good likelihood this will help address the problem.  Goals of post-operative recovery were discussed as well.  We will work to minimize complications.  Questions were answered.  The patient expresses understanding & wishes to proceed with surgery.    -VTE prophylaxis- SCDs, etc -mobilize as tolerated to help recovery    Adin Hector, M.D., F.A.C.S. Gastrointestinal and Minimally Invasive Surgery [facility] [address], [place], [place] [address] [phone] Main / Paging   07/20/2014  Note: Portions of this report may have been transcribed using voice recognition software. Every effort was made to ensure accuracy; however, inadvertent computerized transcription errors may be present.   Any transcriptional errors that result from this process are unintentional.

## 2014-07-20 NOTE — Transfer of Care (Signed)
Immediate Anesthesia Transfer of Care Note  Patient: Stephen Costa  Procedure(s) Performed: Procedure(s):  EXAM UNDER ANESTHESIA, DIAGNOSTIC LAPAROSCOPY LYSIS OF ADHESIONS (N/A)  Patient Location: PACU  Anesthesia Type:General  Level of Consciousness: awake, oriented, patient cooperative and responds to stimulation  Airway & Oxygen Therapy: Patient Spontanous Breathing and Patient connected to face mask oxygen  Post-op Assessment: Report given to PACU RN, Post -op Vital signs reviewed and stable and Patient moving all extremities  Post vital signs: Reviewed and stable  Complications: No apparent anesthesia complications

## 2014-07-21 ENCOUNTER — Encounter (HOSPITAL_COMMUNITY): Payer: Self-pay | Admitting: Surgery

## 2014-07-24 ENCOUNTER — Other Ambulatory Visit: Payer: Self-pay | Admitting: *Deleted

## 2014-07-24 NOTE — Progress Notes (Signed)
Per Dr. Benay Spice, needs appointment with MD or Ned Card, NP on 11/2 or 11/3. POF to scheduler.

## 2014-07-25 ENCOUNTER — Telehealth: Payer: Self-pay | Admitting: Oncology

## 2014-07-25 NOTE — Telephone Encounter (Signed)
s.w. pt and advised on NOV appt....pt ok and aware °

## 2014-07-26 ENCOUNTER — Telehealth (INDEPENDENT_AMBULATORY_CARE_PROVIDER_SITE_OTHER): Payer: Self-pay

## 2014-07-26 NOTE — Telephone Encounter (Signed)
Called pt today to advise him that Dr Johney Maine did discuss his case with one of his partners since talking to the pt on Friday after getting the final pathology report back with no malignancy. Dr Johney Maine and Dr Barry Dienes reviewed pt's case and both agree that pt needs to be scheduled for a EUS by Dr [name] to make sure there are no surprises before going back to the OR again to do the ileostomy takedown. I advised pt that I will call Dr Erlinda Hong office to set up the EUS and will work on getting pt r/s for the ileostomy takedown surgery. Pt understands.

## 2014-07-28 ENCOUNTER — Other Ambulatory Visit: Payer: Self-pay | Admitting: Gastroenterology

## 2014-07-29 NOTE — Addendum Note (Signed)
Addended by: [name] on: 07/29/2014 12:01 PM   Modules accepted: Orders

## 2014-07-31 ENCOUNTER — Other Ambulatory Visit: Payer: Managed Care, Other (non HMO)

## 2014-07-31 ENCOUNTER — Ambulatory Visit (HOSPITAL_BASED_OUTPATIENT_CLINIC_OR_DEPARTMENT_OTHER): Payer: Managed Care, Other (non HMO) | Admitting: Nurse Practitioner

## 2014-07-31 VITALS — BP 129/71 | HR 90 | Temp 98.3°F | Resp 20 | Ht 67.0 in | Wt 260.3 lb

## 2014-07-31 DIAGNOSIS — C20 Malignant neoplasm of rectum: Secondary | ICD-10-CM

## 2014-07-31 DIAGNOSIS — R197 Diarrhea, unspecified: Secondary | ICD-10-CM

## 2014-07-31 NOTE — Progress Notes (Addendum)
Stephen Costa OFFICE PROGRESS NOTE   Diagnosis:  Rectal cancer  INTERVAL HISTORY:   [name] returns prior to scheduled followup. On 07/20/2014 he underwent a diagnostic laparoscopy. He was found to have moderate adhesions of the omentum to the anterior abdominal wall. The colorectal anastomosis 4-5 cm from the anal verge with diffuse rectal wall firmness. No stricture. In the left upper quadrant there was a firm inflamed mass in the left upper quadrant within the greater omentum. Frozen wedge biopsy of the medial edge showed adenocarcinoma. Final pathology showed fat necrosis and fibrosis. No malignancy was identified.   He denies nausea/vomiting. No mouth sores. No diarrhea. He has a good appetite. Mild soreness at the right abdomen. Objective:  Vital signs in last 24 hours:  Blood pressure 129/71, pulse 90, temperature 98.3 F (36.8 C), temperature source Oral, resp. rate 20, height 5\' 7"  (1.702 m), weight 260 lb 4.8 oz (118.071 kg), SpO2 95 %.    HEENT:  No thrush or ulcers. Resp: lungs clear bilaterally. Cardio: regular rate and rhythm. GI: abdomen is soft. Mild tenderness right mid abdomen. Right lower quadrant ileostomy. Vascular: no leg edema.   Lab Results:  Lab Results  Component Value Date   WBC 5.8 07/14/2014   HGB 15.1 07/14/2014   HCT 42.7 07/14/2014   MCV 94.5 07/14/2014   PLT 314 07/14/2014   NEUTROABS 4.1 05/31/2014    Imaging:  No results found.  Medications: I have reviewed the patient's current medications.  Assessment/Plan: 1. 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  Initiation of radiation and Xeloda 10/31/2013; completed on 12/07/2013.   Status post low anterior resection with diverting loop ileostomy 02/21/2014. Final pathology with microscopic foci of residual adenocarcinoma, largest focus 0.4 cm; extension focally beyond the muscularis propria, involving pericolonic fatty tissue; no lymph-vascular invasion;  no perineural invasion; final resection margins negative.   Pathologic stage II (ypT3, ypN0, ypMX).   Cycle 1 adjuvant Xeloda 03/14/2014.   Cycle 2 adjuvant Xeloda 04/04/2014.   Cycle 3 adjuvant Xeloda 04/25/2014.   Cycle 4 adjuvant Xeloda 05/16/2014   Cycle 5 adjuvant Xeloda 06/06/2014  CT abdomen/pelvis 07/12/2014 with areas of soft tissue stranding associated with the omental fat in the upper abdomen predominantly in the left upper quadrant immediately beneath the splenic flexure of the colon. Postsurgical and post procedural changes of radiation therapy in the low anatomic pelvis without definite evidence to suggest local recurrence of disease. 2. Indeterminate 3 mm right upper lobe nodule on staging chest CT 09/30/2013. Stable 3 mm nodule in the right upper lobe on chest CT 07/14/2014. 3. Rectal pain, constipation and bleeding secondary to #1. Improved. 4. Tobacco use. He has quit smoking. 5. 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 6. Status post diagnostic laparoscopy 07/20/2014 with findings of a firm inflamed mass in the left upper quadrant within the greater omentum. Frozen wedge biopsy showed adenocarcinoma. Final pathology showed fat necrosis and fibrosis.   Disposition: [name] appears stable. The final pathology on the left upper quadrant mass showed fat necrosis and fibrosis. He will followup with Dr. Johney Maine regarding the ileostomy reversal. He will keep his next scheduled followup visit here in January 2016. He will contact the office in the interim with any problems.  Patient seen with Dr. Benay Spice.    Stephen Costa ANP/GNP-BC   07/31/2014  11:20 AM  This was a shared visit with Stephen Costa. Mr.Sperry has completed adjuvant therapy and will be scheduled for ileostomy reversal.  The final pathology from the omentum biopsy revealed fat necrosis.  Stephen Costa, M.D.

## 2014-08-02 ENCOUNTER — Encounter (HOSPITAL_COMMUNITY): Payer: Self-pay | Admitting: *Deleted

## 2014-08-02 ENCOUNTER — Ambulatory Visit (HOSPITAL_COMMUNITY)
Admission: RE | Admit: 2014-08-02 | Discharge: 2014-08-02 | Disposition: A | Discharge status: Home / Self Care | Payer: Managed Care, Other (non HMO) | Source: Ambulatory Visit | Attending: Gastroenterology | Admitting: Gastroenterology

## 2014-08-02 ENCOUNTER — Other Ambulatory Visit (INDEPENDENT_AMBULATORY_CARE_PROVIDER_SITE_OTHER): Payer: Self-pay | Admitting: Surgery

## 2014-08-02 ENCOUNTER — Encounter (HOSPITAL_COMMUNITY)
Admission: RE | Disposition: A | Discharge status: Home / Self Care | Payer: Self-pay | Source: Ambulatory Visit | Attending: Gastroenterology

## 2014-08-02 DIAGNOSIS — Z923 Personal history of irradiation: Secondary | ICD-10-CM | POA: Diagnosis not present

## 2014-08-02 DIAGNOSIS — Z888 Allergy status to other drugs, medicaments and biological substances status: Secondary | ICD-10-CM | POA: Insufficient documentation

## 2014-08-02 DIAGNOSIS — G4730 Sleep apnea, unspecified: Secondary | ICD-10-CM | POA: Insufficient documentation

## 2014-08-02 DIAGNOSIS — Z885 Allergy status to narcotic agent status: Secondary | ICD-10-CM | POA: Insufficient documentation

## 2014-08-02 DIAGNOSIS — Z08 Encounter for follow-up examination after completed treatment for malignant neoplasm: Secondary | ICD-10-CM | POA: Diagnosis present

## 2014-08-02 DIAGNOSIS — Z932 Ileostomy status: Secondary | ICD-10-CM | POA: Insufficient documentation

## 2014-08-02 DIAGNOSIS — Z800 Family history of malignant neoplasm of digestive organs: Secondary | ICD-10-CM | POA: Insufficient documentation

## 2014-08-02 DIAGNOSIS — M13869 Other specified arthritis, unspecified knee: Secondary | ICD-10-CM | POA: Diagnosis not present

## 2014-08-02 DIAGNOSIS — Z87891 Personal history of nicotine dependence: Secondary | ICD-10-CM | POA: Insufficient documentation

## 2014-08-02 DIAGNOSIS — Z85048 Personal history of other malignant neoplasm of rectum, rectosigmoid junction, and anus: Secondary | ICD-10-CM | POA: Diagnosis not present

## 2014-08-02 HISTORY — PX: EUS: SHX5427

## 2014-08-02 SURGERY — ULTRASOUND, LOWER GI TRACT, ENDOSCOPIC
Anesthesia: Moderate Sedation

## 2014-08-02 MED ORDER — FENTANYL CITRATE 0.05 MG/ML IJ SOLN
INTRAMUSCULAR | Status: DC | PRN
  Administered 2014-08-02 (×2): 25 ug via INTRAVENOUS

## 2014-08-02 MED ORDER — FENTANYL CITRATE 0.05 MG/ML IJ SOLN
INTRAMUSCULAR | Status: AC
  Filled 2014-08-02: qty 2

## 2014-08-02 MED ORDER — MIDAZOLAM HCL 10 MG/2ML IJ SOLN
INTRAMUSCULAR | Status: DC | PRN
  Administered 2014-08-02 (×2): 2 mg via INTRAVENOUS

## 2014-08-02 MED ORDER — MIDAZOLAM HCL 10 MG/2ML IJ SOLN
INTRAMUSCULAR | Status: AC
  Filled 2014-08-02: qty 2

## 2014-08-02 MED ORDER — SODIUM CHLORIDE 0.9 % IV SOLN
INTRAVENOUS | Status: DC
  Administered 2014-08-02: 500 mL via INTRAVENOUS

## 2014-08-02 NOTE — Progress Notes (Signed)
Surgery on 08/08/2014  Preop on 08/03/2014 at 0800am.  Need orders in EPIC.  Thank You.

## 2014-08-02 NOTE — Interval H&P Note (Signed)
History and Physical Interval Note:  08/02/2014 7:50 AM  Stephen Costa  has presented today for surgery, with the diagnosis of rectal cancer  The various methods of treatment have been discussed with the patient and family. After consideration of risks, benefits and other options for treatment, the patient has consented to  Procedure(s): LOWER ENDOSCOPIC ULTRASOUND (EUS) (N/A) as a surgical intervention .  The patient's history has been reviewed, patient examined, no change in status, stable for surgery.  I have reviewed the patient's chart and labs.  Questions were answered to the patient's satisfaction.     Delrose Rohwer M  Assessment:  1.  Abnormal CT abdomen, post neoadjuvant and post-operative treatment for rectal cancer. 2.  Rectal cancer now with diverting ileostomy.  Plan:  1.  Endorectal ultrasound with possible FNA biopsies. 2.  Risks (bleeding, infection, bowel perforation that could require surgery, sedation-related changes in cardiopulmonary systems), benefits (identification and possible treatment of source of symptoms, exclusion of certain causes of symptoms), and alternatives (watchful waiting, radiographic imaging studies, empiric medical treatment) of endorectal ultrasound with possible fine needle aspiration biopsies were explained to patient/family in detail and patient wishes to proceed.

## 2014-08-02 NOTE — Discharge Instructions (Signed)
Flexible sigmoidoscopy with endorectal ultrasound:  Post procedure instructions:  Read the instructions outlined below and refer to this sheet in the next few weeks. These discharge instructions provide you with general information on caring for yourself after you leave the hospital. Your doctor may also give you specific instructions. While your treatment has been planned according to the most current medical practices available, unavoidable complications occasionally occur. If you have any problems or questions after discharge, call Dr. [name] at [facility] [phone]).  HOME CARE INSTRUCTIONS  ACTIVITY:  You may resume your regular activity, but move at a slower pace for the next 24 hours.   Take frequent rest periods for the next 24 hours.   Walking will help get rid of the air and reduce the bloated feeling in your belly (abdomen).   No driving for 24 hours (because of the medicine (anesthesia) used during the test).   You may shower.   Do not sign any important legal documents or operate any machinery for 24 hours (because of the anesthesia used during the test).  NUTRITION:  Drink plenty of fluids.   You may resume your normal diet as instructed by your doctor.   Begin with a light meal and progress to your normal diet. Heavy or fried foods are harder to digest and may make you feel sick to your stomach (nauseated).   Avoid alcoholic beverages for 24 hours or as instructed.  MEDICATIONS:  You may resume your normal medications unless your doctor tells you otherwise.  WHAT TO EXPECT TODAY:  Some feelings of bloating in the abdomen.   Passage of more gas than usual.   Spotting of blood in your stool or on the toilet paper.  IF YOU HAD POLYPS REMOVED DURING THE COLONOSCOPY:  No aspirin products for 7 days or as instructed.   No alcohol for 7 days or as instructed.   Eat a soft diet for the next 24 hours.   FINDING OUT THE RESULTS OF YOUR TEST  Not all  test results are available during your visit. If your test results are not back during the visit, make an appointment with your caregiver to find out the results. Do not assume everything is normal if you have not heard from your caregiver or the medical facility. It is important for you to follow up on all of your test results.     SEEK IMMEDIATE MEDICAL CARE IF:   You have more than a spotting of blood in your stool.   Your belly is swollen (abdominal distention).   You are nauseated or vomiting.   You have a fever.   You have abdominal pain or discomfort that is severe or gets worse throughout the day.    Document Released: 04/29/2004 Document Revised: 05/28/2011 Document Reviewed: 04/27/2008 [facility] Patient Information 2012 [place].

## 2014-08-02 NOTE — Op Note (Signed)
[facility] [place], [address]   OPERATIVE PROCEDURE REPORT  PATIENT: Stephen Costa, Stephen Costa  MR#: [id number] BIRTHDATE: Feb 05, 1956  GENDER: male ENDOSCOPIST: [name], MD REFERRED BY:  Michael Boston, M.D. PROCEDURE DATE:  08/02/2014 PROCEDURE:   Flexible sigmoidoscopy EUS ASA CLASS:   Class III INDICATIONS:1.  rectal cancer s/p surgery; abnormal post-treatment CT scan. MEDICATIONS: Fentanyl 50 mcg IV and Versed 4 mg IV  DESCRIPTION OF PROCEDURE:   After the risks benefits and alternatives of the procedure were thoroughly explained, informed consent was obtained.  Throughout the procedure, the patients blood pressure, pulse and oxygen saturations were monitored continuously. Under direct visualization, the forward-viewing radial  echoendoscope was introduced through the anus  and advanced to the sigmoid colon .  Water was used as necessary to provide an acoustic interface.  Imaging was obtained at 7.5 and 12Mhz. Upon completion of the imaging, water was removed and the patient was sent to the recovery room in satisfactory condition.    FINDINGS:   Digital rectal exam yielded palpable surgical anastomosis, which felt a bit boggy and modestly firm, but was otherwise normal.  Endoscopically, the anastomosis and previously placed tattoo markings were appreciated.  No obvious lesion was seen endoscopically.  Subsequently, water was instilled into the remaining rectum to facilitate acoustic coupling.  The residual rectal wall was normal, without obvious residual tumor.  No perirectal adenopathy was seen.  There was some deep mesorectal edema, but no obvious findings worrisome for bulky tumor were identified, and no focal region amenable to biopsy was seen. Prostate appears enlarged with several parenchymal calcifications.  ENDOSCOPIC IMPRESSION: As above.  Mesorectal edema, without any focal findings to suggest tumor recurrence.  RECOMMENDATIONS: 1.   Watch for potential complications of procedure. 2.  Will discuss with Dr. Johney Maine.   _______________________________ Lorrin MaisArta Silence, MD 08/02/2014 8:23 AM   CC:

## 2014-08-02 NOTE — H&P (Signed)
Stephen Costa  Sep 11, 1956 [id number]  Patient Care Team: Lynne Logan, MD as PCP - General (Family Medicine) [name], MD as Consulting Physician (Gastroenterology) Michael Boston, MD as Consulting Physician (General Surgery) Ladell Pier, MD as Consulting Physician (Oncology) Marye Round, MD as Consulting Physician (Radiation Oncology)  This patient is a 58 y.o.male who calls today for surgical evaluation.    Patient status post attempted loop ileostomy takedown.  Had omental mass.  Frozen biopsy concerning for cancer.  Final pathology consistent with fat necrosis.  Endorectal ultrasound done.  No evidence of any abnormalities at anastomosis.  Planned loop ileostomy takedown with possible removal of omental pad as well.  Patient Active Problem List   Diagnosis Date Noted  . Paresthesias in right hand 03/29/2014  . 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 Rectal cancer ypT3ypN0 (0/16 LN) s/p LAR/ileostomy 02/21/2014 09/27/2013  . Umbilical hernia s/p repair 02/21/2014 09/27/2013  . Obesity (BMI 30-39.9) 09/27/2013  . Tobacco abuse 09/27/2013    Past Medical History  Diagnosis Date  . Rectal cancer 09/26/2013    ypT3ypN0 (0/16 LN)  . Hemorrhoids   . Cancer 10/05/13    rectum  . Allergy   . Arthritis     knees  . History of radiation therapy 10/31/13-12/08/13    rectal 50.4Gy total  . Rectal cancer   . Sleep apnea     doesn't wear CPAP usually    Past Surgical History  Procedure Laterality Date  . Lumbar disc surgery  1984, 1987  . Wrist surgery Right 2006  . Shoulder surgery Right 1999  . Surgery scrotal / testicular Left 58 y/o    respositioning into scrotum  . Eus N/A 10/05/2013    Procedure: LOWER ENDOSCOPIC ULTRASOUND (EUS);  Surgeon: [name], MD;  Location: [name];  Service: Endoscopy;  Laterality: N/A;  . Flexible sigmoidoscopy N/A 10/05/2013    Procedure: FLEXIBLE SIGMOIDOSCOPY;  Surgeon: [name], MD;  Location: [facility];  Service:  Endoscopy;  Laterality: N/A;  . Proctoscopy N/A 02/21/2014    Procedure: RIGID PROCTOSCOPY;  Surgeon: Adin Hector, MD;  Location: [facility];  Service: General;  Laterality: N/A;  . Low anterior bowel resection  02/21/2014  . Diverting ileostomy  02/21/2014  . Ileostomy closure N/A 07/20/2014    Procedure:  EXAM UNDER ANESTHESIA, DIAGNOSTIC LAPAROSCOPY LYSIS OF ADHESIONS;  Surgeon: Michael Boston, MD;  Location: [facility];  Service: General;  Laterality: N/A;    History   Social History  . Marital Status: Married    Spouse Name: N/A    Number of Children: N/A  . 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 Smoking status: Former Smoker -- 1.00 packs/day for 40 years    Types: Cigarettes    Quit date: 02/21/2014  . Smokeless tobacco: Never Used  . Alcohol Use: No  . Drug Use: No  . Sexual Activity: Not on file   Other Topics Concern  . Not on file   Social History Narrative    Family History  Problem Relation Age of Onset  . Cancer Maternal Uncle     Throat Cancer  . Cancer Paternal Uncle     Colon Cancer    Current Outpatient Prescriptions  Medication Sig Dispense Refill  . HYDROcodone-acetaminophen (NORCO/VICODIN) 5-325 MG per tablet Take 1-2 tablets by mouth every 4 (four) hours as needed for  moderate pain or severe pain. 40 tablet 0  . XELODA 500 MG tablet   0   No current facility-administered medications for this visit.   Facility-Administered Medications Ordered in Other Visits  Medication Dose Route Frequency Provider Last Rate Last Dose  . 0.9 %  sodium chloride infusion   Intravenous Continuous [name], MD 20 mL/hr at 08/02/14 0720 500 mL at 08/02/14 0720     Allergies  Allergen Reactions  . Oxycodone     MAKES PATIENT ANXIOUS & HYPER  . Tegaderm Ag Mesh [Silver]     Blisters    There were no vitals taken for this visit.  Ct Chest W Contrast  07/14/2014   CLINICAL  DATA:  History of rectal cancer diagnosed in December 2014. Right upper lobe pulmonary nodule.  EXAM: CT CHEST WITH CONTRAST  TECHNIQUE: Multidetector CT imaging of the chest was performed during intravenous contrast administration.  CONTRAST:  2mL OMNIPAQUE IOHEXOL 300 MG/ML  SOLN  COMPARISON:  Chest x-ray dated 02/21/2014 and chest CT dated 09/30/2013  FINDINGS: There is a stable 3 mm nodule in the right upper lobe seen on image 20 of series 3. The lungs are otherwise clear. There is no hilar or mediastinal adenopathy. There is a stable small node in the aortopulmonary window, 8 mm on the sagittal view. No osseous abnormality. Heart size is normal.  IMPRESSION: No significant abnormality. Stable 3 mm nodule in the right upper lobe. Follow-up CT scan without contrast in 1 year is recommended.   Electronically Signed   By: Rozetta Nunnery M.D.   On: 07/14/2014 17:02   Ct Abdomen Pelvis W Contrast  07/12/2014   CLINICAL DATA:  58 year old male with history of rectal cancer status post resection and diverting ileostomy, scheduled for ostomy a reversal next week. Status post chemotherapy and radiation therapy.  EXAM: CT ABDOMEN AND PELVIS WITH CONTRAST  TECHNIQUE: Multidetector CT imaging of the abdomen and pelvis was performed using the standard protocol following bolus administration of intravenous contrast.  CONTRAST:  125 mL of Omnipaque 300.  COMPARISON:  CT of the chest, abdomen and pelvis 09/30/2013.  FINDINGS: Lower chest:  Unremarkable.  Hepatobiliary: Heterogeneous areas of low attenuation are noted throughout the hepatic parenchyma, most evident in segments 4B, 5 and 8, presumably areas of extensive hepatic steatosis. No discrete cystic or solid hepatic lesions are otherwise noted. No intra or extrahepatic biliary ductal dilatation. Gallbladder is normal in appearance.  Pancreas: Normal.  Spleen: Normal.  Adrenals/Urinary Tract: Bilateral adrenal glands and bilateral kidneys are normal in appearance. No  hydroureteronephrosis or perinephric stranding to indicate urinary tract obstruction at this time. Urinary bladder is normal in appearance.  Stomach/Bowel: The appearance of the stomach is normal. No pathologic dilatation of the small bowel or colon. Status post distal rectal resection with primary anastomosis at the level of the anorectal junction. No definite mass-like area in the low anatomic pelvis to suggest local recurrence of disease. There is extensive soft tissue stranding in the knees a rectal fat, presumably related to prior surgery and prior radiation therapy. Small amount of thickening of the presacral soft tissues is also noted, also likely treatment related. Right lower quadrant diverting ileostomy. Normal appendix.  Vascular/Lymphatic: Atherosclerosis throughout the abdominal and pelvic vasculature, without evidence of aneurysm or dissection. No definite pathologically enlarged lymph nodes are noted in the abdomen or pelvis.  Reproductive: Prostate gland is unremarkable in appearance.  Other: There is a soft tissue stranding in the left upper quadrant which  appears to be associated with the omentum immediately beneath the splenic flexure, best demonstrated on images 33 through 50 of series 2. There is also a small amount of soft tissue stranding immediately anterior to the proximal transverse colon on image 36 of series 2. No significant volume of ascites. No pneumoperitoneum.  Musculoskeletal: There are no aggressive appearing lytic or blastic lesions noted in the visualized portions of the skeleton.  IMPRESSION: 1. Status post rectal resection with right lower quadrant diverting ileostomy. There post surgical and post procedural changes of radiation therapy in the low anatomic pelvis, as above, without definite evidence to suggest local recurrence of disease (although attention to these regions on follow-up studies is strongly recommended to ensure stability and resolution of these findings).  However, there are some areas of soft tissue stranding associated with the omental fat in the upper abdomen, predominantly in the left upper quadrant immediately beneath the splenic flexure of the colon. At this time, these findings are very amorphous in appearance and are favored to represent areas of fat infarction in the omentum, however, close attention on followup studies is recommended to exclude the possibility of peritoneal spread of disease. If there is planned surgical takedown of the ileostomy in the near future, direct inspection of these regions at the time of surgery is recommended if possible. 2. Heterogeneous hepatic steatosis. 3. Additional incidental findings, as above.   Electronically Signed   By: Vinnie Langton M.D.   On: 07/12/2014 17:07    Note: This dictation was prepared with Dragon/digital dictation along with [employer]. Any transcriptional errors that result from this process are unintentional.

## 2014-08-02 NOTE — H&P (View-Only) (Signed)
Stephen Costa  [address] [place], [place] [address] Phone: [phone] FAX: [phone]     Theresa Dohrman  [birth year] [id number]  CARE TEAM:  PCP: Lynne Logan, MD  Outpatient Care Team: Patient Care Team: Lynne Logan, MD as PCP - General (Family Medicine) [name], MD as Consulting Physician (Gastroenterology) Michael Boston, MD as Consulting Physician (General Surgery) Ladell Pier, MD as Consulting Physician (Oncology) Marye Round, MD as Consulting Physician (Radiation Oncology) Carola Frost, RN as Registered Nurse  Inpatient Treatment Team: Treatment Team: Attending Provider: Michael Boston, MD  This patient is a 58 y.o.male who presents today for surgical evaluation.   Reason for evaluation: ostomy takedown  Patient returns today feeling well.  CAT scan showed mild stranding left upper quadrant and pelvis.  Emptying bag well.  Wounds closed.  In good spirits.  For for colostomy takedown.  Energy level good.  Completed chemotherapy a month ago  Past Medical History  Diagnosis Date  . Rectal cancer 09/26/2013    ypT3ypN0 (0/16 LN)  . Hemorrhoids   . Cancer 10/05/13    rectum  . Allergy   . Arthritis     knees  . History of radiation therapy 10/31/13-12/08/13    rectal 50.4Gy total  . Rectal cancer   . Sleep apnea     doesn't wear CPAP usually    Past Surgical History  Procedure Laterality Date  . Lumbar disc surgery  1984, 1987  . Wrist surgery Right 2006  . Shoulder surgery Right 1999  . Surgery scrotal / testicular Left 58 y/o    respositioning into scrotum  . Eus N/A 10/05/2013    Procedure: LOWER ENDOSCOPIC ULTRASOUND (EUS);  Surgeon: [name], MD;  Location: [name];  Service: Endoscopy;  Laterality: N/A;  . Flexible sigmoidoscopy N/A 10/05/2013    Procedure: FLEXIBLE SIGMOIDOSCOPY;  Surgeon: [name], MD;  Location: [facility];  Service: Endoscopy;  Laterality: N/A;  .  Proctoscopy N/A 02/21/2014    Procedure: RIGID PROCTOSCOPY;  Surgeon: Adin Hector, MD;  Location: [facility];  Service: General;  Laterality: N/A;  . Low anterior bowel resection  02/21/2014  . 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 Marital Status: Married    Spouse Name: N/A    Number of Children: N/A  . 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 Smoking status: Former Smoker -- 1.00 packs/day for 40 years    Types: Cigarettes    Quit date: 02/21/2014  . Smokeless tobacco: Never Used  . Alcohol Use: No  . Drug Use: No  . Sexual Activity: Not on file   Other Topics Concern  . Not on file   Social History Narrative  . No narrative on file    Family History  Problem Relation Age of Onset  . Cancer Maternal Uncle     Throat Cancer  . Cancer Paternal Uncle     Colon Cancer    Current Facility-Administered Medications  Medication Dose Route Frequency Provider Last Rate Last Dose  . bupivacaine 0.25 % ON-Q pump DUAL CATH 300 mL  300 mL Other Continuous Michael Boston, MD      . cefoTEtan (CEFOTAN) 2 g in dextrose 5 % 50 mL IVPB  2 g Intravenous On Call to OR Michael Boston,  MD      . clindamycin (CLEOCIN) 900 mg, gentamicin (GARAMYCIN) 240 mg in sodium chloride 0.9 % 1,000 mL for intraperitoneal lavage   Intraperitoneal To OR Michael Boston, MD         Allergies  Allergen Reactions  . Oxycodone     MAKES PATIENT ANXIOUS & HYPER  . Tegaderm Ag Mesh [Silver]     Blisters    ROS: Constitutional:  No fevers, chills, sweats.  Weight stable Eyes:  No vision changes, No discharge HENT:  No sore throats, nasal drainage Lymph: No neck swelling, No bruising easily Pulmonary:  No cough, productive sputum CV: No orthopnea, PND  Patient walks 30 minutes for about 1 miles without difficulty.  No exertional chest/neck/shoulder/arm pain. GI:  No personal nor family history of  inflammatory bowel disease, irritable bowel syndrome, allergy such as Celiac Sprue, dietary/dairy problems, colitis, ulcers nor gastritis.  No recent sick contacts/gastroenteritis.  No travel outside the country.  No changes in diet. Renal: No UTIs, No hematuria Genital:  No drainage, bleeding, masses Musculoskeletal: No severe joint pain.  Good ROM major joints Skin:  No sores or lesions.  No rashes Heme/Lymph:  No easy bleeding.  No swollen lymph nodes Neuro: No focal weakness/numbness.  No seizures Psych: No suicidal ideation.  No hallucinations  BP 152/88  Pulse 106  Temp(Src) 97.6 F (36.4 C) (Oral)  Resp 16  Ht 5\' 7"  (1.702 m)  Wt 257 lb 8 oz (116.801 kg)  BMI 40.32 kg/m2  SpO2 95%  Physical Exam: General: Pt awake/alert/oriented x4 in no major acute distress Eyes: PERRL, normal EOM. Sclera nonicteric Neuro: CN II-XII intact w/o focal sensory/motor deficits. Lymph: No head/neck/groin lymphadenopathy Psych:  No delerium/psychosis/paranoia HENT: Normocephalic, Mucus membranes moist.  No thrush Neck: Supple, No tracheal deviation Chest: No pain.  Good respiratory excursion. CV:  Pulses intact.  Regular rhythm Abdomen: Soft, Nondistended.  Nontender.  No incarcerated hernias.  Obese but soft.  Ileostomy right lower quadrant pink. Ext:  SCDs BLE.  No significant edema.  No cyanosis Skin: No petechiae / purpurea.  No major sores Musculoskeletal: No severe joint pain.  Good ROM major joints   Results:   Labs: No results found for this or any previous visit (from the past 48 hour(s)).  Imaging / Studies: Ct Chest W Contrast  07/14/2014   CLINICAL DATA:  History of rectal cancer diagnosed in December 2014. Right upper lobe pulmonary nodule.  EXAM: CT CHEST WITH CONTRAST  TECHNIQUE: Multidetector CT imaging of the chest was performed during intravenous contrast administration.  CONTRAST:  52mL OMNIPAQUE IOHEXOL 300 MG/ML  SOLN  COMPARISON:  Chest x-ray dated 02/21/2014 and  chest CT dated 09/30/2013  FINDINGS: There is a stable 3 mm nodule in the right upper lobe seen on image 20 of series 3. The lungs are otherwise clear. There is no hilar or mediastinal adenopathy. There is a stable small node in the aortopulmonary window, 8 mm on the sagittal view. No osseous abnormality. Heart size is normal.  IMPRESSION: No significant abnormality. Stable 3 mm nodule in the right upper lobe. Follow-up CT scan without contrast in 1 year is recommended.   Electronically Signed   By: Rozetta Nunnery M.D.   On: 07/14/2014 17:02   Ct Abdomen Pelvis W Contrast  07/12/2014   CLINICAL DATA:  58 year old male with history of rectal cancer status post resection and diverting ileostomy, scheduled for ostomy a reversal next week. Status post chemotherapy and radiation therapy.  EXAM:  CT ABDOMEN AND PELVIS WITH CONTRAST  TECHNIQUE: Multidetector CT imaging of the abdomen and pelvis was performed using the standard protocol following bolus administration of intravenous contrast.  CONTRAST:  125 mL of Omnipaque 300.  COMPARISON:  CT of the chest, abdomen and pelvis 09/30/2013.  FINDINGS: Lower chest:  Unremarkable.  Hepatobiliary: Heterogeneous areas of low attenuation are noted throughout the hepatic parenchyma, most evident in segments 4B, 5 and 8, presumably areas of extensive hepatic steatosis. No discrete cystic or solid hepatic lesions are otherwise noted. No intra or extrahepatic biliary ductal dilatation. Gallbladder is normal in appearance.  Pancreas: Normal.  Spleen: Normal.  Adrenals/Urinary Tract: Bilateral adrenal glands and bilateral kidneys are normal in appearance. No hydroureteronephrosis or perinephric stranding to indicate urinary tract obstruction at this time. Urinary bladder is normal in appearance.  Stomach/Bowel: The appearance of the stomach is normal. No pathologic dilatation of the small bowel or colon. Status post distal rectal resection with primary anastomosis at the level of the  anorectal junction. No definite mass-like area in the low anatomic pelvis to suggest local recurrence of disease. There is extensive soft tissue stranding in the knees a rectal fat, presumably related to prior surgery and prior radiation therapy. Small amount of thickening of the presacral soft tissues is also noted, also likely treatment related. Right lower quadrant diverting ileostomy. Normal appendix.  Vascular/Lymphatic: Atherosclerosis throughout the abdominal and pelvic vasculature, without evidence of aneurysm or dissection. No definite pathologically enlarged lymph nodes are noted in the abdomen or pelvis.  Reproductive: Prostate gland is unremarkable in appearance.  Other: There is a soft tissue stranding in the left upper quadrant which appears to be associated with the omentum immediately beneath the splenic flexure, best demonstrated on images 33 through 50 of series 2. There is also a small amount of soft tissue stranding immediately anterior to the proximal transverse colon on image 36 of series 2. No significant volume of ascites. No pneumoperitoneum.  Musculoskeletal: There are no aggressive appearing lytic or blastic lesions noted in the visualized portions of the skeleton.  IMPRESSION: 1. Status post rectal resection with right lower quadrant diverting ileostomy. There post surgical and post procedural changes of radiation therapy in the low anatomic pelvis, as above, without definite evidence to suggest local recurrence of disease (although attention to these regions on follow-up studies is strongly recommended to ensure stability and resolution of these findings). However, there are some areas of soft tissue stranding associated with the omental fat in the upper abdomen, predominantly in the left upper quadrant immediately beneath the splenic flexure of the colon. At this time, these findings are very amorphous in appearance and are favored to represent areas of fat infarction in the omentum,  however, close attention on followup studies is recommended to exclude the possibility of peritoneal spread of disease. If there is planned surgical takedown of the ileostomy in the near future, direct inspection of these regions at the time of surgery is recommended if possible. 2. Heterogeneous hepatic steatosis. 3. Additional incidental findings, as above.   Electronically Signed   By: Vinnie Langton M.D.   On: 07/12/2014 17:07    Medications / Allergies: per chart  Antibiotics: Anti-infectives   Start     Dose/Rate Route Frequency Ordered Stop   07/20/14 0617  cefoTEtan (CEFOTAN) 2 g in dextrose 5 % 50 mL IVPB     2 g 100 mL/hr over 30 Minutes Intravenous On call to O.R. 07/20/14 0617 07/21/14 0559   07/20/14 0000  clindamycin (CLEOCIN) 900 mg, gentamicin (GARAMYCIN) 240 mg in sodium chloride 0.9 % 1,000 mL for intraperitoneal lavage    Comments:  Pharmacy may adjust dosing strength, schedule, rate of infusion, etc as needed to optimize therapy    Intraperitoneal To Surgery 07/19/14 1446 07/21/14 0000      Assessment  Stephen Costa  58 y.o. male  Day of Surgery  Procedure(s): LOOP ILEOSTOMY TAKEDOWN, EXAM UNDER ANESTHESIA, DIAGNOSTIC LAPAROSCOPY  Problem List:  Active Problems:   * No active hospital problems. *   Stage II rectal cancer improved after post adjuvant chemotherapy.  Desiring ileostomy takedown.  Plan:  Plan diagnostic laparoscopy to confirm no abnormalities in left upper quadrant or pelvis.  Not to likely in a T3 lesion down the pelvis that is node negative.  Check anastomosis in the lower rectum.  Then ileostomy takedown.  Discussed with him the technique and expected postoperative goals and recovery.  He agrees.  He is appreciative of all the caries from numerous specialists and staff members.  He does have a difficult airway but the anesthesia team is aware and has a plan.  Hopefully things will go well:  The anatomy & physiology of the digestive tract was  discussed.  The pathophysiology was discussed.  Possibility of remaining with an ostomy permanently was discussed.  I offered ostomy takedown.  Laparoscopic & open techniques were discussed.   Risks such as bleeding, infection, abscess, leak, reoperation, possible re-ostomy, injury to other organs, hernia, heart attack, death, and other risks were discussed.   I noted a good likelihood this will help address the problem.  Goals of post-operative recovery were discussed as well.  We will work to minimize complications.  Questions were answered.  The patient expresses understanding & wishes to proceed with surgery.    -VTE prophylaxis- SCDs, etc -mobilize as tolerated to help recovery    Adin Hector, M.D., F.A.C.S. Gastrointestinal and Minimally Invasive Surgery [facility] [address], [place], [place] [address] [phone] Main / Paging   07/20/2014  Note: Portions of this report may have been transcribed using voice recognition software. Every effort was made to ensure accuracy; however, inadvertent computerized transcription errors may be present.   Any transcriptional errors that result from this process are unintentional.

## 2014-08-03 ENCOUNTER — Encounter (HOSPITAL_COMMUNITY)
Admission: RE | Admit: 2014-08-03 | Discharge: 2014-08-03 | Disposition: A | Discharge status: Home / Self Care | Payer: Managed Care, Other (non HMO) | Source: Ambulatory Visit | Attending: Surgery | Admitting: Surgery

## 2014-08-03 ENCOUNTER — Encounter (HOSPITAL_COMMUNITY): Payer: Self-pay | Admitting: Gastroenterology

## 2014-08-03 ENCOUNTER — Telehealth (INDEPENDENT_AMBULATORY_CARE_PROVIDER_SITE_OTHER): Payer: Self-pay

## 2014-08-03 DIAGNOSIS — Z01818 Encounter for other preprocedural examination: Secondary | ICD-10-CM | POA: Diagnosis present

## 2014-08-03 HISTORY — DX: Personal history of antineoplastic chemotherapy: Z92.21

## 2014-08-03 NOTE — Progress Notes (Addendum)
CBC and BMP done 07/14/2014 in [facility]  07/14/14 CT chest in [facility].

## 2014-08-03 NOTE — Progress Notes (Signed)
Called [facility] and spoke with Burney at office regarding clear liquid status for patient until 0730am day of surgery.  She will speak with Dr Johney Maine and find out if this will be ok since abdominal surgery and no bowel prep.   C

## 2014-08-03 NOTE — Telephone Encounter (Signed)
Pt is scheduled for ileostomy takedown.  He was told by pre-op ok to have clear liquids up to 7:30 the morning of surgery.  Surgery is at 2:35 p.m.  Confirm this is OK.

## 2014-08-03 NOTE — Patient Instructions (Signed)
Stephen Costa  08/03/2014   Your procedure is scheduled on: 08/08/2014    Come thru the [facility] entrance.  .  Follow the Signs to [place] at    1230pm  Call this number if you have problems the morning of surgery: [phone]   Remember:   Do not eat food after midnite.  May have clear liquids until 0730am then npo.    Take these medicines the morning of surgery with A SIP OF WATER:    Do not wear jewelry,   Do not wear lotions, powders, or perfumes.  deodorant.   Men may shave face and neck.  Do not bring valuables to the hospital.  Contacts, dentures or bridgework may not be worn into surgery.  Leave suitcase in the car. After surgery it may be brought to your room.  For patients admitted to the hospital, checkout time is 11:00 AM the day of  discharge.        Please read over the following fact sheets that you were given:    [place] Allowed                                                                     Foods Excluded  Coffee and tea, regular and decaf                             liquids that you cannot  Plain Jell-O in any flavor                                             see through such as: Fruit ices (not with fruit pulp)                                     milk, soups, orange juice  Iced Popsicles                                    All solid food Carbonated beverages, regular and diet                                    Cranberry, grape and apple juices Sports drinks like Gatorade Lightly seasoned clear broth or consume(fat free) Sugar, honey syrup  Sample Menu Breakfast                                Lunch                                     Supper Cranberry juice                    Beef broth  Chicken broth Jell-O                                     Grape juice                           Apple juice Coffee or tea                        Jell-O                                      Popsicle                               Coffee or tea                        Coffee or tea  _____________________________________________________________________  , coughing and deep breathing exercises, leg exercises            [facility] - Preparing for Surgery Before surgery, you can play an important role.  Because skin is not sterile, your skin needs to be as free of germs as possible.  You can reduce the number of germs on your skin by washing with CHG (chlorahexidine gluconate) soap before surgery.  CHG is an antiseptic cleaner which kills germs and bonds with the skin to continue killing germs even after washing. Please DO NOT use if you have an allergy to CHG or antibacterial soaps.  If your skin becomes reddened/irritated stop using the CHG and inform your nurse when you arrive at Short Stay. Do not shave (including legs and underarms) for at least 48 hours prior to the first CHG shower.  You may shave your face/neck. Please follow these instructions carefully:  1.  Shower with CHG Soap the night before surgery and the  morning of Surgery.  2.  If you choose to wash your hair, wash your hair first as usual with your  normal  shampoo.  3.  After you shampoo, rinse your hair and body thoroughly to remove the  shampoo.                           4.  Use CHG as you would any other liquid soap.  You can apply chg directly  to the skin and wash                       Gently with a scrungie or clean washcloth.  5.  Apply the CHG Soap to your body ONLY FROM THE NECK DOWN.   Do not use on face/ open                           Wound or open sores. Avoid contact with eyes, ears mouth and genitals (private parts).                       Wash face,  Genitals (private parts) with your normal soap.             6.  Wash thoroughly, paying special attention to the  area where your surgery  will be performed.  7.  Thoroughly rinse your body with warm water from the neck down.  8.  DO NOT shower/wash with your normal  soap after using and rinsing off  the CHG Soap.                9.  Pat yourself dry with a clean towel.            10.  Wear clean pajamas.            11.  Place clean sheets on your bed the night of your first shower and do not  sleep with pets. Day of Surgery : Do not apply any lotions/deodorants the morning of surgery.  Please wear clean clothes to the hospital/surgery center.  FAILURE TO FOLLOW THESE INSTRUCTIONS MAY RESULT IN THE CANCELLATION OF YOUR SURGERY PATIENT SIGNATURE_________________________________  NURSE SIGNATURE__________________________________  ________________________________________________________________________  WHAT IS A BLOOD TRANSFUSION? Blood Transfusion Information  A transfusion is the replacement of blood or some of its parts. Blood is made up of multiple cells which provide different functions.  Red blood cells carry oxygen and are used for blood loss replacement.  White blood cells fight against infection.  Platelets control bleeding.  Plasma helps clot blood.  Other blood products are available for specialized needs, such as hemophilia or other clotting disorders. BEFORE THE TRANSFUSION  Who gives blood for transfusions?   Healthy volunteers who are fully evaluated to make sure their blood is safe. This is blood bank blood. Transfusion therapy is the safest it has ever been in the practice of medicine. Before blood is taken from a donor, a complete history is taken to make sure that person has no history of diseases nor engages in risky social behavior (examples are intravenous drug use or sexual activity with multiple partners). The donor's travel history is screened to minimize risk of transmitting infections, such as malaria. The donated blood is tested for signs of infectious diseases, such as HIV and hepatitis. The blood is then tested to be sure it is compatible with you in order to minimize the chance of a transfusion reaction. If you or a  relative donates blood, this is often done in anticipation of surgery and is not appropriate for emergency situations. It takes many days to process the donated blood. RISKS AND COMPLICATIONS Although transfusion therapy is very safe and saves many lives, the main dangers of transfusion include:   Getting an infectious disease.  Developing a transfusion reaction. This is an allergic reaction to something in the blood you were given. Every precaution is taken to prevent this. The decision to have a blood transfusion has been considered carefully by your caregiver before blood is given. Blood is not given unless the benefits outweigh the risks. AFTER THE TRANSFUSION  Right after receiving a blood transfusion, you will usually feel much better and more energetic. This is especially true if your red blood cells have gotten low (anemic). The transfusion raises the level of the red blood cells which carry oxygen, and this usually causes an energy increase.  The nurse administering the transfusion will monitor you carefully for complications. HOME CARE INSTRUCTIONS  No special instructions are needed after a transfusion. You may find your energy is better. Speak with your caregiver about any limitations on activity for underlying diseases you may have. SEEK MEDICAL CARE IF:   Your condition is not improving after your transfusion.  You develop redness or irritation at the  intravenous (IV) site. SEEK IMMEDIATE MEDICAL CARE IF:  Any of the following symptoms occur over the next 12 hours:  Shaking chills.  You have a temperature by mouth above 102 F (38.9 C), not controlled by medicine.  Chest, back, or muscle pain.  People around you feel you are not acting correctly or are confused.  Shortness of breath or difficulty breathing.  Dizziness and fainting.  You get a rash or develop hives.  You have a decrease in urine output.  Your urine turns a dark color or changes to pink, red, or  brown. Any of the following symptoms occur over the next 10 days:  You have a temperature by mouth above 102 F (38.9 C), not controlled by medicine.  Shortness of breath.  Weakness after normal activity.  The white part of the eye turns yellow (jaundice).  You have a decrease in the amount of urine or are urinating less often.  Your urine turns a dark color or changes to pink, red, or brown. Document Released: 09/12/2000 Document Revised: 12/08/2011 Document Reviewed: 05/01/2008 [facility] Patient Information 2014 [place], [place].  _______________________________________________________________________

## 2014-08-04 ENCOUNTER — Other Ambulatory Visit (INDEPENDENT_AMBULATORY_CARE_PROVIDER_SITE_OTHER): Payer: Self-pay | Admitting: Surgery

## 2014-08-04 NOTE — Telephone Encounter (Signed)
Left msg for [name] that clear liquids was fine per Dr. Johney [place].

## 2014-08-04 NOTE — Progress Notes (Signed)
Received call from [facility] that ok for patient to have clear liquids until 0730am  Am of surgery.  Called and spoke with patient and made him aware that ok for clear liquids from 12 midnite until 0730am am of surgery.  Patient voiced understanding.

## 2014-08-04 NOTE — Telephone Encounter (Signed)
Whatever anesthesiology says is OK to be NPO.  Keep it simple that AM, though

## 2014-08-07 ENCOUNTER — Other Ambulatory Visit (INDEPENDENT_AMBULATORY_CARE_PROVIDER_SITE_OTHER): Payer: Self-pay | Admitting: Surgery

## 2014-08-08 ENCOUNTER — Encounter (HOSPITAL_COMMUNITY): Payer: Self-pay | Admitting: Certified Registered Nurse Anesthetist

## 2014-08-08 ENCOUNTER — Inpatient Hospital Stay (HOSPITAL_COMMUNITY): Payer: Managed Care, Other (non HMO) | Admitting: Certified Registered Nurse Anesthetist

## 2014-08-08 ENCOUNTER — Inpatient Hospital Stay (HOSPITAL_COMMUNITY)
Admission: RE | Admit: 2014-08-08 | Discharge: 2014-08-11 | DRG: 330 | Disposition: A | Discharge status: Home / Self Care | Payer: Managed Care, Other (non HMO) | Source: Ambulatory Visit | Attending: Surgery | Admitting: Surgery

## 2014-08-08 ENCOUNTER — Encounter (HOSPITAL_COMMUNITY)
Admission: RE | Disposition: A | Discharge status: Home / Self Care | Payer: Self-pay | Source: Ambulatory Visit | Attending: Surgery

## 2014-08-08 DIAGNOSIS — G4730 Sleep apnea, unspecified: Secondary | ICD-10-CM | POA: Diagnosis present

## 2014-08-08 DIAGNOSIS — K567 Ileus, unspecified: Secondary | ICD-10-CM | POA: Diagnosis not present

## 2014-08-08 DIAGNOSIS — Z9221 Personal history of antineoplastic chemotherapy: Secondary | ICD-10-CM

## 2014-08-08 DIAGNOSIS — E669 Obesity, unspecified: Secondary | ICD-10-CM | POA: Diagnosis present

## 2014-08-08 DIAGNOSIS — K660 Peritoneal adhesions (postprocedural) (postinfection): Secondary | ICD-10-CM | POA: Diagnosis present

## 2014-08-08 DIAGNOSIS — Z01812 Encounter for preprocedural laboratory examination: Secondary | ICD-10-CM

## 2014-08-08 DIAGNOSIS — Z6841 Body Mass Index (BMI) 40.0 and over, adult: Secondary | ICD-10-CM

## 2014-08-08 DIAGNOSIS — Z923 Personal history of irradiation: Secondary | ICD-10-CM | POA: Diagnosis not present

## 2014-08-08 DIAGNOSIS — Z87891 Personal history of nicotine dependence: Secondary | ICD-10-CM

## 2014-08-08 DIAGNOSIS — C20 Malignant neoplasm of rectum: Secondary | ICD-10-CM | POA: Diagnosis present

## 2014-08-08 DIAGNOSIS — Z432 Encounter for attention to ileostomy: Secondary | ICD-10-CM | POA: Diagnosis present

## 2014-08-08 DIAGNOSIS — K654 Sclerosing mesenteritis: Secondary | ICD-10-CM | POA: Diagnosis present

## 2014-08-08 HISTORY — PX: ILEOSTOMY CLOSURE: SHX1784

## 2014-08-08 LAB — TYPE AND SCREEN
ABO/RH(D): B POS
ANTIBODY SCREEN: NEGATIVE

## 2014-08-08 SURGERY — CLOSURE, ILEOSTOMY
Anesthesia: General

## 2014-08-08 MED ORDER — LACTATED RINGERS IV SOLN
INTRAVENOUS | Status: DC
  Administered 2014-08-08: 18:00:00 via INTRAVENOUS

## 2014-08-08 MED ORDER — MENTHOL 3 MG MT LOZG
1.0000 | LOZENGE | OROMUCOSAL | Status: DC | PRN
  Filled 2014-08-08: qty 9

## 2014-08-08 MED ORDER — FENTANYL CITRATE 0.05 MG/ML IJ SOLN
INTRAMUSCULAR | Status: AC
  Filled 2014-08-08: qty 5

## 2014-08-08 MED ORDER — DIPHENHYDRAMINE HCL 12.5 MG/5ML PO ELIX: 12.5000 mg | ORAL_SOLUTION | Freq: Four times a day (QID) | ORAL | Status: DC | PRN

## 2014-08-08 MED ORDER — GLYCOPYRROLATE 0.2 MG/ML IJ SOLN
INTRAMUSCULAR | Status: DC | PRN
  Administered 2014-08-08: .8 mg via INTRAVENOUS

## 2014-08-08 MED ORDER — DEXTROSE 5 % IV SOLN
2.0000 g | Freq: Two times a day (BID) | INTRAVENOUS | Status: AC
  Administered 2014-08-09: 2 g via INTRAVENOUS
  Filled 2014-08-08: qty 2

## 2014-08-08 MED ORDER — ONDANSETRON HCL 4 MG PO TABS: 4.0000 mg | ORAL_TABLET | Freq: Four times a day (QID) | ORAL | Status: DC | PRN

## 2014-08-08 MED ORDER — LIDOCAINE HCL 1 % IJ SOLN
INTRAMUSCULAR | Status: DC | PRN
  Administered 2014-08-08: 100 mg via INTRADERMAL

## 2014-08-08 MED ORDER — SODIUM CHLORIDE 0.9 % IV SOLN: 250.0000 mL | INTRAVENOUS | Status: DC | PRN

## 2014-08-08 MED ORDER — LACTATED RINGERS IV SOLN
INTRAVENOUS | Status: DC | PRN
  Administered 2014-08-08 (×2): via INTRAVENOUS

## 2014-08-08 MED ORDER — PHENYLEPHRINE 40 MCG/ML (10ML) SYRINGE FOR IV PUSH (FOR BLOOD PRESSURE SUPPORT)
PREFILLED_SYRINGE | INTRAVENOUS | Status: AC
  Filled 2014-08-08: qty 10

## 2014-08-08 MED ORDER — HYDROMORPHONE HCL 2 MG/ML IJ SOLN
INTRAMUSCULAR | Status: AC
  Filled 2014-08-08: qty 1

## 2014-08-08 MED ORDER — SODIUM CHLORIDE 0.9 % IJ SOLN: 3.0000 mL | INTRAMUSCULAR | Status: DC | PRN

## 2014-08-08 MED ORDER — SODIUM CHLORIDE 0.9 % IJ SOLN
3.0000 mL | Freq: Two times a day (BID) | INTRAMUSCULAR | Status: DC
  Administered 2014-08-08 – 2014-08-10 (×3): 3 mL via INTRAVENOUS

## 2014-08-08 MED ORDER — PROPOFOL 10 MG/ML IV BOLUS
INTRAVENOUS | Status: AC
  Filled 2014-08-08: qty 20

## 2014-08-08 MED ORDER — LACTATED RINGERS IV BOLUS (SEPSIS): 1000.0000 mL | Freq: Three times a day (TID) | INTRAVENOUS | Status: AC | PRN

## 2014-08-08 MED ORDER — PROMETHAZINE HCL 25 MG/ML IJ SOLN: 6.2500 mg | INTRAMUSCULAR | Status: DC | PRN

## 2014-08-08 MED ORDER — CEFOTETAN DISODIUM-DEXTROSE 2-2.08 GM-% IV SOLR
INTRAVENOUS | Status: AC
  Filled 2014-08-08: qty 50

## 2014-08-08 MED ORDER — DIPHENHYDRAMINE HCL 50 MG/ML IJ SOLN: 12.5000 mg | Freq: Four times a day (QID) | INTRAMUSCULAR | Status: DC | PRN

## 2014-08-08 MED ORDER — BUPIVACAINE-EPINEPHRINE (PF) 0.25% -1:200000 IJ SOLN
INTRAMUSCULAR | Status: AC
  Filled 2014-08-08: qty 30

## 2014-08-08 MED ORDER — SODIUM CHLORIDE 0.9 % IV SOLN
INTRAVENOUS | Status: DC
  Administered 2014-08-09 (×2): via INTRAVENOUS
  Administered 2014-08-10: 75 mL/h via INTRAVENOUS

## 2014-08-08 MED ORDER — HEPARIN SODIUM (PORCINE) 5000 UNIT/ML IJ SOLN
5000.0000 [IU] | Freq: Three times a day (TID) | INTRAMUSCULAR | Status: DC
  Administered 2014-08-09 – 2014-08-11 (×7): 5000 [IU] via SUBCUTANEOUS
  Filled 2014-08-08 (×10): qty 1

## 2014-08-08 MED ORDER — DEXAMETHASONE SODIUM PHOSPHATE 10 MG/ML IJ SOLN
INTRAMUSCULAR | Status: DC | PRN
  Administered 2014-08-08: 10 mg via INTRAVENOUS

## 2014-08-08 MED ORDER — PROPOFOL 10 MG/ML IV BOLUS
INTRAVENOUS | Status: DC | PRN
  Administered 2014-08-08: 200 mg via INTRAVENOUS

## 2014-08-08 MED ORDER — ONDANSETRON HCL 4 MG/2ML IJ SOLN
INTRAMUSCULAR | Status: DC | PRN
  Administered 2014-08-08: 4 mg via INTRAVENOUS

## 2014-08-08 MED ORDER — 0.9 % SODIUM CHLORIDE (POUR BTL) OPTIME
TOPICAL | Status: DC | PRN
  Administered 2014-08-08: 1000 mL

## 2014-08-08 MED ORDER — HYDROMORPHONE HCL 1 MG/ML IJ SOLN: 0.2500 mg | INTRAMUSCULAR | Status: DC | PRN

## 2014-08-08 MED ORDER — PHENYLEPHRINE HCL 10 MG/ML IJ SOLN
INTRAMUSCULAR | Status: DC | PRN
  Administered 2014-08-08: 80 ug via INTRAVENOUS

## 2014-08-08 MED ORDER — HYDROMORPHONE HCL 4 MG PO TABS: 2.0000 mg | ORAL_TABLET | Freq: Four times a day (QID) | ORAL | Status: DC | PRN

## 2014-08-08 MED ORDER — GLYCOPYRROLATE 0.2 MG/ML IJ SOLN
INTRAMUSCULAR | Status: AC
  Filled 2014-08-08: qty 4

## 2014-08-08 MED ORDER — KETOROLAC TROMETHAMINE 30 MG/ML IJ SOLN
INTRAMUSCULAR | Status: DC | PRN
  Administered 2014-08-08: 30 mg via INTRAVENOUS

## 2014-08-08 MED ORDER — METOPROLOL TARTRATE 1 MG/ML IV SOLN
5.0000 mg | Freq: Four times a day (QID) | INTRAVENOUS | Status: DC
  Administered 2014-08-08 – 2014-08-11 (×11): 5 mg via INTRAVENOUS
  Filled 2014-08-08 (×15): qty 5

## 2014-08-08 MED ORDER — ALVIMOPAN 12 MG PO CAPS
12.0000 mg | ORAL_CAPSULE | Freq: Two times a day (BID) | ORAL | Status: DC
Start: 2014-08-09 — End: 2014-08-11
  Administered 2014-08-09 – 2014-08-10 (×4): 12 mg via ORAL
  Filled 2014-08-08 (×6): qty 1

## 2014-08-08 MED ORDER — CISATRACURIUM BESYLATE 20 MG/10ML IV SOLN
INTRAVENOUS | Status: AC
  Filled 2014-08-08: qty 10

## 2014-08-08 MED ORDER — FENTANYL CITRATE 0.05 MG/ML IJ SOLN
INTRAMUSCULAR | Status: DC | PRN
  Administered 2014-08-08: 100 ug via INTRAVENOUS
  Administered 2014-08-08: 150 ug via INTRAVENOUS

## 2014-08-08 MED ORDER — MIDAZOLAM HCL 5 MG/5ML IJ SOLN
INTRAMUSCULAR | Status: DC | PRN
Start: 2014-08-08 — End: 2014-08-08
  Administered 2014-08-08: 2 mg via INTRAVENOUS

## 2014-08-08 MED ORDER — SUCCINYLCHOLINE CHLORIDE 20 MG/ML IJ SOLN
INTRAMUSCULAR | Status: DC | PRN
  Administered 2014-08-08: 100 mg via INTRAVENOUS

## 2014-08-08 MED ORDER — EPHEDRINE SULFATE 50 MG/ML IJ SOLN
INTRAMUSCULAR | Status: AC
  Filled 2014-08-08: qty 1

## 2014-08-08 MED ORDER — PHENOL 1.4 % MT LIQD
2.0000 | OROMUCOSAL | Status: DC | PRN
  Filled 2014-08-08: qty 177

## 2014-08-08 MED ORDER — MIDAZOLAM HCL 2 MG/2ML IJ SOLN
INTRAMUSCULAR | Status: AC
  Filled 2014-08-08: qty 2

## 2014-08-08 MED ORDER — SACCHAROMYCES BOULARDII 250 MG PO CAPS
250.0000 mg | ORAL_CAPSULE | Freq: Two times a day (BID) | ORAL | Status: DC
  Administered 2014-08-08 – 2014-08-10 (×5): 250 mg via ORAL
  Filled 2014-08-08 (×8): qty 1

## 2014-08-08 MED ORDER — ACETAMINOPHEN 500 MG PO TABS
1000.0000 mg | ORAL_TABLET | Freq: Three times a day (TID) | ORAL | Status: DC
  Administered 2014-08-08 – 2014-08-10 (×7): 1000 mg via ORAL
  Filled 2014-08-08 (×11): qty 2

## 2014-08-08 MED ORDER — CISATRACURIUM BESYLATE (PF) 10 MG/5ML IV SOLN
INTRAVENOUS | Status: DC | PRN
  Administered 2014-08-08: 4 mg via INTRAVENOUS
  Administered 2014-08-08 (×2): 2 mg via INTRAVENOUS
  Administered 2014-08-08: 10 mg via INTRAVENOUS
  Administered 2014-08-08: 2 mg via INTRAVENOUS

## 2014-08-08 MED ORDER — NEOSTIGMINE METHYLSULFATE 10 MG/10ML IV SOLN
INTRAVENOUS | Status: DC | PRN
  Administered 2014-08-08: 5 mg via INTRAVENOUS

## 2014-08-08 MED ORDER — MEPERIDINE HCL 50 MG/ML IJ SOLN
6.2500 mg | INTRAMUSCULAR | Status: DC | PRN
Start: 2014-08-08 — End: 2014-08-08

## 2014-08-08 MED ORDER — CHLORHEXIDINE GLUCONATE 4 % EX LIQD: 60.0000 mL | Freq: Once | CUTANEOUS | Status: DC

## 2014-08-08 MED ORDER — ALVIMOPAN 12 MG PO CAPS
12.0000 mg | ORAL_CAPSULE | Freq: Once | ORAL | Status: AC
  Administered 2014-08-08: 12 mg via ORAL
  Filled 2014-08-08: qty 1

## 2014-08-08 MED ORDER — HYDROMORPHONE HCL 1 MG/ML IJ SOLN
0.5000 mg | INTRAMUSCULAR | Status: DC | PRN
  Administered 2014-08-09 (×4): 1 mg via INTRAVENOUS
  Administered 2014-08-10: 2 mg via INTRAVENOUS
  Administered 2014-08-10: 1 mg via INTRAVENOUS
  Administered 2014-08-10: 2 mg via INTRAVENOUS
  Filled 2014-08-08 (×3): qty 1
  Filled 2014-08-08 (×2): qty 2
  Filled 2014-08-08 (×2): qty 1

## 2014-08-08 MED ORDER — LACTATED RINGERS IR SOLN
Status: DC | PRN
  Administered 2014-08-08: 1000 mL

## 2014-08-08 MED ORDER — ONDANSETRON HCL 4 MG/2ML IJ SOLN: 4.0000 mg | Freq: Four times a day (QID) | INTRAMUSCULAR | Status: DC | PRN

## 2014-08-08 MED ORDER — HYDROMORPHONE HCL 1 MG/ML IJ SOLN
INTRAMUSCULAR | Status: DC | PRN
  Administered 2014-08-08: .2 mg via INTRAVENOUS

## 2014-08-08 MED ORDER — KETOROLAC TROMETHAMINE 30 MG/ML IJ SOLN
INTRAMUSCULAR | Status: AC
  Filled 2014-08-08: qty 1

## 2014-08-08 MED ORDER — DEXTROSE 5 % IV SOLN
2.0000 g | INTRAVENOUS | Status: AC
  Administered 2014-08-08: 2 g via INTRAVENOUS
  Filled 2014-08-08: qty 2

## 2014-08-08 MED ORDER — BUPIVACAINE-EPINEPHRINE 0.25% -1:200000 IJ SOLN
INTRAMUSCULAR | Status: DC | PRN
  Administered 2014-08-08: 60 mL

## 2014-08-08 MED ORDER — ALUM & MAG HYDROXIDE-SIMETH 200-200-20 MG/5ML PO SUSP: 30.0000 mL | Freq: Four times a day (QID) | ORAL | Status: DC | PRN

## 2014-08-08 MED ORDER — HEPARIN SODIUM (PORCINE) 5000 UNIT/ML IJ SOLN
5000.0000 [IU] | Freq: Once | INTRAMUSCULAR | Status: AC
  Administered 2014-08-08: 5000 [IU] via SUBCUTANEOUS
  Filled 2014-08-08: qty 1

## 2014-08-08 MED ORDER — EPHEDRINE SULFATE 50 MG/ML IJ SOLN
INTRAMUSCULAR | Status: DC | PRN
  Administered 2014-08-08 (×2): 5 mg via INTRAVENOUS

## 2014-08-08 MED ORDER — NEOSTIGMINE METHYLSULFATE 10 MG/10ML IV SOLN
INTRAVENOUS | Status: AC
  Filled 2014-08-08: qty 1

## 2014-08-08 MED ORDER — SODIUM CHLORIDE 0.9 % IJ SOLN
INTRAMUSCULAR | Status: AC
  Filled 2014-08-08: qty 10

## 2014-08-08 MED ORDER — LIP MEDEX EX OINT
1.0000 "application " | TOPICAL_OINTMENT | Freq: Two times a day (BID) | CUTANEOUS | Status: DC
  Administered 2014-08-09 – 2014-08-10 (×4): 1 via TOPICAL
  Filled 2014-08-08 (×2): qty 7

## 2014-08-08 MED ORDER — MAGIC MOUTHWASH
15.0000 mL | Freq: Four times a day (QID) | ORAL | Status: DC | PRN
  Administered 2014-08-09: 15 mL via ORAL
  Filled 2014-08-08 (×2): qty 15

## 2014-08-08 SURGICAL SUPPLY — 71 items
BLADE EXTENDED COATED 6.5IN (ELECTRODE) IMPLANT
BLADE HEX COATED 2.75 (ELECTRODE) ×6 IMPLANT
BLADE SURG SZ10 CARB STEEL (BLADE) ×3 IMPLANT
CANISTER SUCTION 2500CC (MISCELLANEOUS) ×3 IMPLANT
CATH KIT ON Q 7.5IN SLV (PAIN MANAGEMENT) IMPLANT
COUNTER NEEDLE 20 DBL MAG RED (NEEDLE) ×3 IMPLANT
COVER MAYO STAND STRL (DRAPES) ×6 IMPLANT
DECANTER SPIKE VIAL GLASS SM (MISCELLANEOUS) IMPLANT
DRAIN CHANNEL 19F RND (DRAIN) IMPLANT
DRAPE LAPAROSCOPIC ABDOMINAL (DRAPES) ×3 IMPLANT
DRAPE SHEET LG 3/4 BI-LAMINATE (DRAPES) ×3 IMPLANT
DRAPE UTILITY XL STRL (DRAPES) ×6 IMPLANT
DRAPE WARM FLUID 44X44 (DRAPE) ×3 IMPLANT
DRSG OPSITE POSTOP 4X10 (GAUZE/BANDAGES/DRESSINGS) IMPLANT
DRSG OPSITE POSTOP 4X6 (GAUZE/BANDAGES/DRESSINGS) IMPLANT
DRSG OPSITE POSTOP 4X8 (GAUZE/BANDAGES/DRESSINGS) IMPLANT
DRSG TEGADERM 2-3/8X2-3/4 SM (GAUZE/BANDAGES/DRESSINGS) IMPLANT
DRSG TEGADERM 4X4.75 (GAUZE/BANDAGES/DRESSINGS) ×3 IMPLANT
ELECT REM PT RETURN 9FT ADLT (ELECTROSURGICAL) ×3
ELECTRODE REM PT RTRN 9FT ADLT (ELECTROSURGICAL) ×1 IMPLANT
GAUZE SPONGE 4X4 12PLY STRL (GAUZE/BANDAGES/DRESSINGS) ×3 IMPLANT
GLOVE ECLIPSE 8.0 STRL XLNG CF (GLOVE) ×6 IMPLANT
GLOVE INDICATOR 8.0 STRL GRN (GLOVE) ×6 IMPLANT
GOWN STRL REUS W/TWL XL LVL3 (GOWN DISPOSABLE) ×12 IMPLANT
KIT BASIN OR (CUSTOM PROCEDURE TRAY) ×3 IMPLANT
LEGGING LITHOTOMY PAIR STRL (DRAPES) IMPLANT
LIGASURE IMPACT 36 18CM CVD LR (INSTRUMENTS) IMPLANT
PACK GENERAL/GYN (CUSTOM PROCEDURE TRAY) ×3 IMPLANT
PENCIL BUTTON HOLSTER BLD 10FT (ELECTRODE) ×3 IMPLANT
POUCH SPECIMEN RETRIEVAL 10MM (ENDOMECHANICALS) ×3 IMPLANT
SCALPEL HARMONIC ACE (MISCELLANEOUS) ×3 IMPLANT
SCISSORS LAP 5X35 DISP (ENDOMECHANICALS) ×3 IMPLANT
SEALER TISSUE G2 CVD JAW 35 (ENDOMECHANICALS) IMPLANT
SEALER TISSUE G2 CVD JAW 45CM (ENDOMECHANICALS)
SEALER TISSUE G2 STRG ARTC 35C (ENDOMECHANICALS) IMPLANT
SET IRRIG TUBING LAPAROSCOPIC (IRRIGATION / IRRIGATOR) ×3 IMPLANT
SLEEVE XCEL OPT CAN 5 100 (ENDOMECHANICALS) ×6 IMPLANT
SPONGE LAP 18X18 X RAY DECT (DISPOSABLE) ×6 IMPLANT
STAPLER 90 3.5 STAND SLIM (STAPLE) ×3
STAPLER 90 3.5 STD SLIM (STAPLE) ×1 IMPLANT
STAPLER PROXIMATE 75MM BLUE (STAPLE) ×3 IMPLANT
STAPLER VISISTAT 35W (STAPLE) ×3 IMPLANT
SUCTION POOLE TIP (SUCTIONS) ×6 IMPLANT
SUT MNCRL AB 4-0 PS2 18 (SUTURE) ×6 IMPLANT
SUT NOV 1 T60/GS (SUTURE) IMPLANT
SUT NOVA NAB DX-16 0-1 5-0 T12 (SUTURE) IMPLANT
SUT NOVA T20/GS 25 (SUTURE) IMPLANT
SUT PDS AB 1 CT1 27 (SUTURE) ×6 IMPLANT
SUT PDS AB 1 CTX 36 (SUTURE) IMPLANT
SUT SILK 2 0 (SUTURE) ×2
SUT SILK 2 0 SH CR/8 (SUTURE) IMPLANT
SUT SILK 2 0SH CR/8 30 (SUTURE) ×3 IMPLANT
SUT SILK 2-0 18XBRD TIE 12 (SUTURE) ×1 IMPLANT
SUT SILK 3 0 (SUTURE) ×2
SUT SILK 3 0 SH CR/8 (SUTURE) ×3 IMPLANT
SUT SILK 3-0 18XBRD TIE 12 (SUTURE) ×1 IMPLANT
SUT VIC AB 2-0 SH 18 (SUTURE) ×3 IMPLANT
SUT VIC AB 3-0 SH 18 (SUTURE) ×3 IMPLANT
SUT VICRYL 2 0 18  UND BR (SUTURE) ×2
SUT VICRYL 2 0 18 UND BR (SUTURE) ×1 IMPLANT
TAPE UMBILICAL COTTON 1/8X30 (MISCELLANEOUS) ×3 IMPLANT
TOWEL OR 17X26 10 PK STRL BLUE (TOWEL DISPOSABLE) ×6 IMPLANT
TOWEL OR NON WOVEN STRL DISP B (DISPOSABLE) ×6 IMPLANT
TRAY FOLEY CATH 14FRSI W/METER (CATHETERS) ×3 IMPLANT
TRAY FOLEY CATH 16FRSI W/METER (SET/KITS/TRAYS/PACK) ×3 IMPLANT
TRAY LAPAROSCOPIC (CUSTOM PROCEDURE TRAY) ×3 IMPLANT
TROCAR BLADELESS OPT 5 100 (ENDOMECHANICALS) ×3 IMPLANT
TROCAR XCEL NON-BLD 11X100MML (ENDOMECHANICALS) ×3 IMPLANT
TUBING INSUFFLATION 10FT LAP (TUBING) ×3 IMPLANT
TUNNELER SHEATH ON-Q 16GX12 DP (PAIN MANAGEMENT) IMPLANT
YANKAUER SUCT BULB TIP 10FT TU (MISCELLANEOUS) ×6 IMPLANT

## 2014-08-08 NOTE — Interval H&P Note (Signed)
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 Final biopsy and omental masses fat necrosis.  EUS ultrasound shows no evidence of cancer recurrence in pelvis or anastomosis.  After consideration of risks, benefits and other options for treatment, the patient has consented to  Procedure(s): TAKEDOWN OF LOOP ILEOSOTOMY, EXAM UNDER ANESTHESIA (N/A) LAPAROSCOPY DIAGNOSTIC, with removal of mass (N/A) as a surgical intervention .  The patient's history has been reviewed, patient examined, no change in status, stable for surgery.  I have reviewed the patient's chart and labs.  Questions were answered to the patient's satisfaction.     [name]

## 2014-08-08 NOTE — Consult Note (Signed)
WOC ostomy follow up Stoma type/location: Patient seen in Short Stay  As he awaits takedown of RUQ ileostomy surgery.  Order in chart for stoma site selection/marking.  Suspect this is in error as patient already has an ostomy.  If a colostomy is needed, the same location on the left side is preferred. [place] nursing team will not follow, but will remain available to this patient, the nursing, surgery and medical teams.  Please re-consult if needed. Thanks, Maudie Flakes, MSN, RN, [place], [place], [place] [phone])

## 2014-08-08 NOTE — Anesthesia Postprocedure Evaluation (Signed)
Anesthesia Post Note  Patient: Stephen Costa  Procedure(s) Performed: Procedure(s) (LRB): TAKEDOWN OF LOOP ILEOSOTOMY, LAPAROSCOPPIC LYSIS OF ADHESIONS with removal of omental mass (N/A)  Anesthesia type: General  Patient location: PACU  Post pain: Pain level controlled  Post assessment: Post-op Vital signs reviewed  Last Vitals: BP 155/85 mmHg  Pulse 92  Temp(Src) 36.7 C (Oral)  Resp 12  Ht 5\' 7"  (1.702 m)  Wt 264 lb (119.75 kg)  BMI 41.34 kg/m2  SpO2 94%  Post vital signs: Reviewed  Level of consciousness: sedated  Complications: No apparent anesthesia complications

## 2014-08-08 NOTE — Plan of Care (Signed)
Problem: Phase I Progression Outcomes Goal: Tubes/drains patent Outcome: Not Applicable Date Met:  62/19/47 Goal: Voiding-avoid urinary catheter unless indicated Outcome: Completed/Met Date Met:  08/08/14  Problem: Phase II Progression Outcomes Goal: Pain controlled Outcome: Completed/Met Date Met:  08/08/14

## 2014-08-08 NOTE — H&P (View-Only) (Signed)
Stephen Costa  [birth year] [id number]  Patient Care Team: Lynne Logan, MD as PCP - General (Family Medicine) [name], MD as Consulting Physician (Gastroenterology) Michael Boston, MD as Consulting Physician (General Surgery) Ladell Pier, MD as Consulting Physician (Oncology) Marye Round, MD as Consulting Physician (Radiation Oncology)  This patient is a 58 y.o.male who calls today for surgical evaluation.    Patient status post attempted loop ileostomy takedown.  Had omental mass.  Frozen biopsy concerning for cancer.  Final pathology consistent with fat necrosis.  Endorectal ultrasound done.  No evidence of any abnormalities at anastomosis.  Planned loop ileostomy takedown with possible removal of omental pad as well.  Patient Active Problem List   Diagnosis Date Noted  . Paresthesias in right hand 03/29/2014  . 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 Rectal cancer ypT3ypN0 (0/16 LN) s/p LAR/ileostomy 02/21/2014 09/27/2013  . Umbilical hernia s/p repair 02/21/2014 09/27/2013  . Obesity (BMI 30-39.9) 09/27/2013  . Tobacco abuse 09/27/2013    Past Medical History  Diagnosis Date  . Rectal cancer 09/26/2013    ypT3ypN0 (0/16 LN)  . Hemorrhoids   . Cancer 10/05/13    rectum  . Allergy   . Arthritis     knees  . History of radiation therapy 10/31/13-12/08/13    rectal 50.4Gy total  . Rectal cancer   . Sleep apnea     doesn't wear CPAP usually    Past Surgical History  Procedure Laterality Date  . Lumbar disc surgery  1984, 1987  . Wrist surgery Right 2006  . Shoulder surgery Right 1999  . Surgery scrotal / testicular Left 58 y/o    respositioning into scrotum  . Eus N/A 10/05/2013    Procedure: LOWER ENDOSCOPIC ULTRASOUND (EUS);  Surgeon: [name], MD;  Location: [name];  Service: Endoscopy;  Laterality: N/A;  . Flexible sigmoidoscopy N/A 10/05/2013    Procedure: FLEXIBLE SIGMOIDOSCOPY;  Surgeon: [name], MD;  Location: [facility];  Service:  Endoscopy;  Laterality: N/A;  . Proctoscopy N/A 02/21/2014    Procedure: RIGID PROCTOSCOPY;  Surgeon: Adin Hector, MD;  Location: [facility];  Service: General;  Laterality: N/A;  . Low anterior bowel resection  02/21/2014  . Diverting ileostomy  02/21/2014  . Ileostomy closure N/A 07/20/2014    Procedure:  EXAM UNDER ANESTHESIA, DIAGNOSTIC LAPAROSCOPY LYSIS OF ADHESIONS;  Surgeon: Michael Boston, MD;  Location: [facility];  Service: General;  Laterality: N/A;    History   Social History  . Marital Status: Married    Spouse Name: N/A    Number of Children: N/A  . 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 Smoking status: Former Smoker -- 1.00 packs/day for 40 years    Types: Cigarettes    Quit date: 02/21/2014  . Smokeless tobacco: Never Used  . Alcohol Use: No  . Drug Use: No  . Sexual Activity: Not on file   Other Topics Concern  . Not on file   Social History Narrative    Family History  Problem Relation Age of Onset  . Cancer Maternal Uncle     Throat Cancer  . Cancer Paternal Uncle     Colon Cancer    Current Outpatient Prescriptions  Medication Sig Dispense Refill  . HYDROcodone-acetaminophen (NORCO/VICODIN) 5-325 MG per tablet Take 1-2 tablets by mouth every 4 (four) hours as needed for  moderate pain or severe pain. 40 tablet 0  . XELODA 500 MG tablet   0   No current facility-administered medications for this visit.   Facility-Administered Medications Ordered in Other Visits  Medication Dose Route Frequency Provider Last Rate Last Dose  . 0.9 %  sodium chloride infusion   Intravenous Continuous [name], MD 20 mL/hr at 08/02/14 0720 500 mL at 08/02/14 0720     Allergies  Allergen Reactions  . Oxycodone     MAKES PATIENT ANXIOUS & HYPER  . Tegaderm Ag Mesh [Silver]     Blisters    There were no vitals taken for this visit.  Ct Chest W Contrast  07/14/2014   CLINICAL  DATA:  History of rectal cancer diagnosed in December 2014. Right upper lobe pulmonary nodule.  EXAM: CT CHEST WITH CONTRAST  TECHNIQUE: Multidetector CT imaging of the chest was performed during intravenous contrast administration.  CONTRAST:  3mL OMNIPAQUE IOHEXOL 300 MG/ML  SOLN  COMPARISON:  Chest x-ray dated 02/21/2014 and chest CT dated 09/30/2013  FINDINGS: There is a stable 3 mm nodule in the right upper lobe seen on image 20 of series 3. The lungs are otherwise clear. There is no hilar or mediastinal adenopathy. There is a stable small node in the aortopulmonary window, 8 mm on the sagittal view. No osseous abnormality. Heart size is normal.  IMPRESSION: No significant abnormality. Stable 3 mm nodule in the right upper lobe. Follow-up CT scan without contrast in 1 year is recommended.   Electronically Signed   By: Rozetta Nunnery M.D.   On: 07/14/2014 17:02   Ct Abdomen Pelvis W Contrast  07/12/2014   CLINICAL DATA:  58 year old male with history of rectal cancer status post resection and diverting ileostomy, scheduled for ostomy a reversal next week. Status post chemotherapy and radiation therapy.  EXAM: CT ABDOMEN AND PELVIS WITH CONTRAST  TECHNIQUE: Multidetector CT imaging of the abdomen and pelvis was performed using the standard protocol following bolus administration of intravenous contrast.  CONTRAST:  125 mL of Omnipaque 300.  COMPARISON:  CT of the chest, abdomen and pelvis 09/30/2013.  FINDINGS: Lower chest:  Unremarkable.  Hepatobiliary: Heterogeneous areas of low attenuation are noted throughout the hepatic parenchyma, most evident in segments 4B, 5 and 8, presumably areas of extensive hepatic steatosis. No discrete cystic or solid hepatic lesions are otherwise noted. No intra or extrahepatic biliary ductal dilatation. Gallbladder is normal in appearance.  Pancreas: Normal.  Spleen: Normal.  Adrenals/Urinary Tract: Bilateral adrenal glands and bilateral kidneys are normal in appearance. No  hydroureteronephrosis or perinephric stranding to indicate urinary tract obstruction at this time. Urinary bladder is normal in appearance.  Stomach/Bowel: The appearance of the stomach is normal. No pathologic dilatation of the small bowel or colon. Status post distal rectal resection with primary anastomosis at the level of the anorectal junction. No definite mass-like area in the low anatomic pelvis to suggest local recurrence of disease. There is extensive soft tissue stranding in the knees a rectal fat, presumably related to prior surgery and prior radiation therapy. Small amount of thickening of the presacral soft tissues is also noted, also likely treatment related. Right lower quadrant diverting ileostomy. Normal appendix.  Vascular/Lymphatic: Atherosclerosis throughout the abdominal and pelvic vasculature, without evidence of aneurysm or dissection. No definite pathologically enlarged lymph nodes are noted in the abdomen or pelvis.  Reproductive: Prostate gland is unremarkable in appearance.  Other: There is a soft tissue stranding in the left upper quadrant which  appears to be associated with the omentum immediately beneath the splenic flexure, best demonstrated on images 33 through 50 of series 2. There is also a small amount of soft tissue stranding immediately anterior to the proximal transverse colon on image 36 of series 2. No significant volume of ascites. No pneumoperitoneum.  Musculoskeletal: There are no aggressive appearing lytic or blastic lesions noted in the visualized portions of the skeleton.  IMPRESSION: 1. Status post rectal resection with right lower quadrant diverting ileostomy. There post surgical and post procedural changes of radiation therapy in the low anatomic pelvis, as above, without definite evidence to suggest local recurrence of disease (although attention to these regions on follow-up studies is strongly recommended to ensure stability and resolution of these findings).  However, there are some areas of soft tissue stranding associated with the omental fat in the upper abdomen, predominantly in the left upper quadrant immediately beneath the splenic flexure of the colon. At this time, these findings are very amorphous in appearance and are favored to represent areas of fat infarction in the omentum, however, close attention on followup studies is recommended to exclude the possibility of peritoneal spread of disease. If there is planned surgical takedown of the ileostomy in the near future, direct inspection of these regions at the time of surgery is recommended if possible. 2. Heterogeneous hepatic steatosis. 3. Additional incidental findings, as above.   Electronically Signed   By: Vinnie Langton M.D.   On: 07/12/2014 17:07    Note: This dictation was prepared with Dragon/digital dictation along with [employer]. Any transcriptional errors that result from this process are unintentional.

## 2014-08-08 NOTE — Anesthesia Preprocedure Evaluation (Addendum)
Anesthesia Evaluation  Patient identified by MRN, date of birth, ID band Patient awake    Reviewed: Allergy & Precautions, H&P , NPO status , Patient's Chart, lab work & pertinent test results, reviewed documented beta blocker date and time   History of Anesthesia Complications Negative for: history of anesthetic complications  Airway Mallampati: III  TM Distance: >3 FB Neck ROM: Full    Dental no notable dental hx. (+) Dental Advisory Given   Pulmonary sleep apnea and Continuous Positive Airway Pressure Ventilation , former smoker,  breath sounds clear to auscultation  Pulmonary exam normal       Cardiovascular negative cardio ROS  Rhythm:Regular Rate:Normal     Neuro/Psych negative neurological ROS  negative psych ROS   GI/Hepatic negative GI ROS, Neg liver ROS,   Endo/Other  Morbid obesity  Renal/GU negative Renal ROS     Musculoskeletal  (+) Arthritis -, Osteoarthritis,    Abdominal (+) + obese,   Peds  Hematology negative hematology ROS (+)   Anesthesia Other Findings Thick neck, recessed chin  Reproductive/Obstetrics negative OB ROS                             Anesthesia Physical  Anesthesia Plan  ASA: III  Anesthesia Plan: General   Post-op Pain Management:    Induction: Intravenous  Airway Management Planned: Oral ETT and Video Laryngoscope Planned  Additional Equipment: None  Intra-op Plan:   Post-operative Plan: Extubation in OR  Informed Consent: I have reviewed the patients History and Physical, chart, labs and discussed the procedure including the risks, benefits and alternatives for the proposed anesthesia with the patient or authorized representative who has indicated his/her understanding and acceptance.   Dental advisory given  Plan Discussed with: CRNA  Anesthesia Plan Comments:        Anesthesia Quick Evaluation

## 2014-08-08 NOTE — Transfer of Care (Signed)
Immediate Anesthesia Transfer of Care Note  Patient: Stephen Costa  Procedure(s) Performed: Procedure(s): TAKEDOWN OF LOOP ILEOSOTOMY, LAPAROSCOPPIC LYSIS OF ADHESIONS with removal of omental mass (N/A)  Patient Location: PACU  Anesthesia Type:General  Level of Consciousness: awake, alert  and oriented  Airway & Oxygen Therapy: Patient Spontanous Breathing and Patient connected to face mask oxygen  Post-op Assessment: Report given to PACU RN, Post -op Vital signs reviewed and stable and Patient moving all extremities  Post vital signs: Reviewed and stable  Complications: No apparent anesthesia complications

## 2014-08-08 NOTE — Op Note (Addendum)
08/08/2014  5:38 PM  PATIENT:  Stephen Costa  58 y.o. 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  OMENTAL MASS  2.  LOOP ILEOSTOMY  DISPOSITION OF SPECIMEN:  PATHOLOGY  COUNTS:  YES  PLAN OF CARE: Admit to inpatient   PATIENT DISPOSITION:  PACU - hemodynamically stable.  INDICATION: Pleasant patient status post low anterior resection with diverting loop ileostomy to protect the very distal anastomosis.  The patient has recovered from that surgery with the anastomosis well-healed.  It was felt safe to have the loop ileostomy taken down.  Omental mass noted in LUQ.  I took him to the operating room last month.  Frozen of the biopsy the omental mass was concerning for cancer.  Therefore ostomy takedown aborted.  Final pathology came back consistent with fat necrosis.  Had endorectal ultrasound  done since some stranding at the anastomosis.  That looked patent and fine.  No other abnormalities.  Therefore, in side to reattempt ileostomy takedown.  Plan full excision of the omental mass as well. I discussed the procedure with the patient:  The anatomy & physiology of the digestive tract was discussed.  The pathophysiology was discussed.  Possibility of remaining with an ostomy permanently was discussed.  I offered ostomy takedown.  Offered removal of the omental mass as well.  Laparoscopic & open techniques were discussed.   Risks such as bleeding, infection, abscess, leak, reoperation, possible re-ostomy, injury to other organs, hernia, heart attack, death, and other risks were discussed.   I noted a good likelihood this will help address the problem.  Goals of post-operative recovery were discussed as well.  We will work to minimize complications.  Questions were answered.  The patient expresses understanding & wishes to proceed with surgery.  OR FINDINGS:   Moderate intra-abdominal adhesions.  Ellipsoid region of fat necrosis most likely greater omentum in LUQ.  Some mild liquefication of fat but no true abscess.  Rest of omentum and epiploic appendages soft.  Mass away from colon.  No injury.  Moderate adhesions of Loop ileostomy to anterior abdominal wall and peritoneum.  Eventually freed.  Side-to-side staple anastomosis with TA 90 stapling across common defect.  DESCRIPTION:   Informed consent was confirmed.   The patient received IV antibiotics & underwent general anesthesia without any difficulty.  Foley catheter was sterilely placed. SCDs were active during the entire case.  The abdomen was prepped and draped in sterile fashion.  The ostomy is covered  with a sterile gauze and dressing.  Proceed with diagnostic laparoscopy.  Placed 30mm ports in the upper abdomen x 3.  Did laparoscopicaly lyse adhesions to find the firm nodular fat and a left upper quadrant adherent to the anterior  abdominal wall in the left axillary line.  Used Harmonic scalpel primarily.  I did carefully free the mass off some deeper omentum and epiploic appendages.  Was able to easily stay away from the colon.  There was an obvious transition area.  I did encounter one area of some liquefied fat but not really an abscess.  Freed the inflamed firm mass off the peritoneum of the anterior abdominal wall.  Placed inside an Endo Catch bag.  Did that with a 62mm port placed just medial to the loop ileostomy.  I did proceed with loop ileostomy takedown.  I made a biconcave curvilinear incision transversely around the loop ileostomy in the RLQ abdominal wall.  He had some skin ulceration laterally, so I excised that as well.  I got into the subcutaneous tissues.  I used careful focused sharp Mets scissor dissection.  Some focused cautery dissection as well.  That helped to free adhesions to the subcutaneous tisses & fascia.  This took some time.  Very dense adhesions.  I was able to enter into the peritoneum focally.  I did a gentle finger sweep.  Gradually came around circumferentially and freed the loop of ileum from remaining adhesions to the abdominal wall, especially the rectus muscles.  We were able eviscerate some bowel proximally and distally, most of it was rather fixed intra-abdominally.  Eventually help to get some length.  End up transecting across the loop ileostomy to better free both limbs and straighten them out.  Freed some adhesions on both sides that they could be better mobilized.  I did a side-to-side stapled anastomosis using a 75 GIA.  I used a TA 90 to staple off the common defect & the loop ileostomy.  I closed the mesenteric defect using interrupted silk stitches, covering the TA 90 staple line.  The anastomosis looked healthy and viable.  Silk stitch placed at the crotch of the anastomosis.  We returned the anastomosis into the abdominal cavity.  Finger sweep circumferentially noted no adhesions.  It  rested well.  Allowed some greater omentum to lay over the anastomosis.  We changed gloves and instruments.  Irrigated copiously into the wound and fascia.  I reapproximated the peritoneum and rectus muscle vertically using 0 Vicryl suture.  I reapproximated the fascia transversely using running #1 PDS stitches.  I irrigated with into the subcutaneous tissues.  I reapproximated the skin using 4-0 Monocryl stitch interrupted sutrues every 1 cm, leaving gaps.  I packed the corners & centrally with antibiotic soaked umbilical tape for wicks.    Gloves changed.  75mm port sites closed at the skin as well.  Sterile dressings were applied.  The patient is being extubated to go to recovery room.  I discussed postop care with the patient in the office.  I discussed it in the holding area.  Instructions are written.  I discussed the patient's status to his wife.  Questions were answered.  They expressed understanding & appreciation.  Adin Hector, M.D., F.A.C.S. Gastrointestinal and Minimally Invasive Surgery [facility] [address], [place], [place] [address] [phone] Main / Paging

## 2014-08-09 ENCOUNTER — Encounter (HOSPITAL_COMMUNITY): Payer: Self-pay | Admitting: Surgery

## 2014-08-09 LAB — CBC
HEMATOCRIT: 38.3 % — AB (ref 39.0–52.0)
Hemoglobin: 13.2 g/dL (ref 13.0–17.0)
MCH: 33 pg (ref 26.0–34.0)
MCHC: 34.5 g/dL (ref 30.0–36.0)
MCV: 95.8 fL (ref 78.0–100.0)
PLATELETS: 302 10*3/uL (ref 150–400)
RBC: 4 MIL/uL — ABNORMAL LOW (ref 4.22–5.81)
RDW: 13.6 % (ref 11.5–15.5)
WBC: 9.8 10*3/uL (ref 4.0–10.5)

## 2014-08-09 LAB — BASIC METABOLIC PANEL
Anion gap: 16 — ABNORMAL HIGH (ref 5–15)
BUN: 12 mg/dL (ref 6–23)
CO2: 24 meq/L (ref 19–32)
Calcium: 9.2 mg/dL (ref 8.4–10.5)
Chloride: 98 mEq/L (ref 96–112)
Creatinine, Ser: 0.85 mg/dL (ref 0.50–1.35)
GFR calc Af Amer: >90 mL/min (ref >90)
GFR calc non Af Amer: >90 mL/min (ref >90)
GLUCOSE: 152 mg/dL — AB (ref 70–99)
Potassium: 4.9 mEq/L (ref 3.7–5.3)
Sodium: 138 mEq/L (ref 137–147)

## 2014-08-09 LAB — MAGNESIUM: Magnesium: 1.9 mg/dL (ref 1.5–2.5)

## 2014-08-09 NOTE — Progress Notes (Signed)
[place]  [address] [place], [place] [address] Phone: [phone] FAX: [phone]    Maxi Rodas [id number] Jun 12, 1956  CARE TEAM:  PCP: Lynne Logan, MD  Outpatient Care Team: Patient Care Team: Lynne Logan, MD as PCP - General (Family Medicine) [name], MD as Consulting Physician (Gastroenterology) Michael Boston, MD as Consulting Physician (General Surgery) Ladell Pier, MD as Consulting Physician (Oncology) Marye Round, MD as Consulting Physician (Radiation Oncology)  Inpatient Treatment Team: Treatment Team: Attending Provider: Michael Boston, MD; Registered Nurse: July Dizon Pricilla Holm, RN; Technician: Senaida Ores Debbink, NT   Subjective:  Bloody drainage at ileostomy site.  Controlled with pressure & extra dressing.  Pain controlled.  Walking in hallways.  Tolerating liquids.  He want to eat solids  Objective:  Vital signs:  Filed Vitals:   08/08/14 2200 08/09/14 0154 08/09/14 0446 08/09/14 0621  BP: 137/86 139/83  131/70  Pulse: 96 91  86  Temp: 97.5 F (36.4 C) 97.6 F (36.4 C)  97.7 F (36.5 C)  TempSrc: Oral Oral  Oral  Resp: 16 15  16   Height:      Weight:   267 lb (121.11 kg)   SpO2: 93% 95%  93%    Last BM Date: 08/08/14 (via ileostomy)  Intake/Output   Yesterday:  11/10 0701 - 11/11 0700 In: 3250 [I.V.:3200; IV Piggyback:50] Out: 2050 [Urine:2000; Blood:50] This shift:  Total I/O In: -  Out: 9191 [Urine:1725]  Bowel function:  Flatus: n  BM: n  Drain: n/a  Physical Exam:  General: Pt awake/alert/oriented x4 in no acute distress Eyes: PERRL, normal EOM.  Sclera clear.  No icterus Neuro: CN II-XII intact w/o focal sensory/motor deficits. Lymph: No head/neck/groin lymphadenopathy Psych:  No delerium/psychosis/paranoia HENT: Normocephalic, Mucus membranes moist.  No thrush Neck: Supple, No tracheal deviation Chest: No chest wall pain w good excursion CV:  Pulses  intact.  Regular rhythm MS: Normal AROM mjr joints.  No obvious deformity Abdomen: Soft.  Mildly distended.  Morbidly obese.  Mildly tender at incisions only.  Bulky dressing clean/dry.  No evidence of peritonitis.  No incarcerated hernias. Ext:  SCDs BLE.  No mjr edema.  No cyanosis Skin: No petechiae / purpura   Problem List:   Principal Problem:   Rectal cancer ypT3ypN0 (0/16 LN) s/p LAR/ileostomy 02/21/2014 Active Problems:   Obesity (BMI 30-39.9)   Assessment  Stephen Costa  58 y.o. male  1 Day Post-Op  Procedure(s): TAKEDOWN OF LOOP ILEOSOTOMY, LAPAROSCOPPIC LYSIS OF ADHESIONS with removal of omental mass  Stable.  Plan:  Probably change outer dressing at old ileostomy site  Keep on liquids for now.  Fulls OK.  Solids when +flatus & tolerating fulls  Follow-up pathology on omental mass.  -VTE prophylaxis- SCDs, etc  -mobilize as tolerated to help recovery  Adin Hector, M.D., F.A.C.S. Gastrointestinal and Minimally Invasive Surgery [facility] [address] [place], [place] [address] [phone] Main / Paging   08/09/2014   Results:   Labs: Results for orders placed or performed during the hospital encounter of 08/08/14 (from the past 48 hour(s))  Type and screen     Status: None   Collection Time: 08/08/14 12:30 PM  Result Value Ref Range   ABO/RH(D) B POS    Antibody Screen NEG    Sample Expiration 32/10/3341   Basic metabolic panel     Status: Abnormal   Collection Time: 08/09/14  4:15  AM  Result Value Ref Range   Sodium 138 137 - 147 mEq/L   Potassium 4.9 3.7 - 5.3 mEq/L   Chloride 98 96 - 112 mEq/L   CO2 24 19 - 32 mEq/L   Glucose, Bld 152 (H) 70 - 99 mg/dL   BUN 12 6 - 23 mg/dL   Creatinine, Ser 0.85 0.50 - 1.35 mg/dL   Calcium 9.2 8.4 - 10.5 mg/dL   GFR calc non Af Amer >90 >90 mL/min   GFR calc Af Amer >90 >90 mL/min    Comment: (NOTE) The eGFR has been calculated using the CKD EPI equation. This  calculation has not been validated in all clinical situations. eGFR's persistently <90 mL/min signify possible Chronic Kidney Disease.    Anion gap 16 (H) 5 - 15  CBC     Status: Abnormal   Collection Time: 08/09/14  4:15 AM  Result Value Ref Range   WBC 9.8 4.0 - 10.5 K/uL   RBC 4.00 (L) 4.22 - 5.81 MIL/uL   Hemoglobin 13.2 13.0 - 17.0 g/dL   HCT 38.3 (L) 39.0 - 52.0 %   MCV 95.8 78.0 - 100.0 fL   MCH 33.0 26.0 - 34.0 pg   MCHC 34.5 30.0 - 36.0 g/dL   RDW 13.6 11.5 - 15.5 %   Platelets 302 150 - 400 K/uL  Magnesium     Status: None   Collection Time: 08/09/14  4:15 AM  Result Value Ref Range   Magnesium 1.9 1.5 - 2.5 mg/dL    Imaging / Studies: No results found.  Medications / Allergies: per chart  Antibiotics: Anti-infectives    Start     Dose/Rate Route Frequency Ordered Stop   08/09/14 0200  cefoTEtan (CEFOTAN) 2 g in dextrose 5 % 50 mL IVPB     2 g100 mL/hr over 30 Minutes Intravenous Every 12 hours 08/08/14 1837 08/09/14 0230   08/08/14 1215  cefoTEtan (CEFOTAN) 2 g in dextrose 5 % 50 mL IVPB     2 g100 mL/hr over 30 Minutes Intravenous 60 min pre-op 08/08/14 1201 08/08/14 1400       Note: Portions of this report may have been transcribed using voice recognition software. Every effort was made to ensure accuracy; however, inadvertent computerized transcription errors may be present.   Any transcriptional errors that result from this process are unintentional.

## 2014-08-09 NOTE — Plan of Care (Signed)
Problem: Phase I Progression Outcomes Goal: Pain controlled with appropriate interventions Outcome: Completed/Met Date Met:  08/09/14 Goal: OOB as tolerated unless otherwise ordered Outcome: Completed/Met Date Met:  08/09/14 Goal: Vital signs/hemodynamically stable Outcome: Completed/Met Date Met:  08/09/14

## 2014-08-09 NOTE — Plan of Care (Signed)
Problem: Phase II Progression Outcomes Goal: Progress activity as tolerated unless otherwise ordered Outcome: Completed/Met Date Met:  08/09/14

## 2014-08-09 NOTE — Plan of Care (Signed)
Problem: Phase II Progression Outcomes Goal: Progressing with IS, TCDB Outcome: Completed/Met Date Met:  08/09/14

## 2014-08-09 NOTE — Plan of Care (Signed)
Problem: Phase II Progression Outcomes Goal: Vital signs stable Outcome: Completed/Met Date Met:  08/09/14

## 2014-08-10 NOTE — Progress Notes (Signed)
[place]  [address] [place], [place] [address] Phone: [phone] FAX: [phone]    Diontre Harps [id number] 04/16/1956  CARE TEAM:  PCP: Lynne Logan, MD  Outpatient Care Team: Patient Care Team: Lynne Logan, MD as PCP - General (Family Medicine) [name], MD as Consulting Physician (Gastroenterology) Michael Boston, MD as Consulting Physician (General Surgery) Ladell Pier, MD as Consulting Physician (Oncology) Marye Round, MD as Consulting Physician (Radiation Oncology)  Inpatient Treatment Team: Treatment Team: Attending Provider: Michael Boston, MD; Registered Nurse: July Dizon Pricilla Holm, RN; Technician: Leda Quail, NT   Subjective:  Bloody drainage at ileostomy site - less.  Pain controlled.  Walking in hallways.  Tolerating clears liquids.  He want to eat solids - ?not advanced  Objective:  Vital signs:  Filed Vitals:   08/09/14 1400 08/09/14 1800 08/09/14 2157 08/10/14 0552  BP: 127/65 122/62 143/90 134/77  Pulse: 88 78 67 81  Temp: 98.2 F (36.8 C) 98 F (36.7 C) 97.6 F (36.4 C) 97.8 F (36.6 C)  TempSrc: Oral Oral Axillary Oral  Resp: 18 18 18 16   Height:      Weight:    266 lb 4.8 oz (120.793 kg)  SpO2: 93% 99% 94% 93%    Last BM Date: 08/08/14  Intake/Output   Yesterday:  11/11 0701 - 11/12 0700 In: 3173.8 [P.O.:1080; I.V.:2093.8] Out: 2575 [Urine:2575] This shift:     Bowel function:  Flatus: Yes  BM: Scant  Drain: n/a  Physical Exam:  General: Pt awake/alert/oriented x4 in no acute distress Eyes: PERRL, normal EOM.  Sclera clear.  No icterus Neuro: CN II-XII intact w/o focal sensory/motor deficits. Lymph: No head/neck/groin lymphadenopathy Psych:  No delerium/psychosis/paranoia HENT: Normocephalic, Mucus membranes moist.  No thrush Neck: Supple, No tracheal deviation Chest: No chest wall pain w good excursion CV:  Pulses intact.  Regular  rhythm MS: Normal AROM mjr joints.  No obvious deformity Abdomen: Soft.  Mildly distended.  Morbidly obese.  Mildly tender at incisions only.  Bulky dressing clean/dry.  No evidence of peritonitis.  No incarcerated hernias. Ext:  SCDs BLE.  No mjr edema.  No cyanosis Skin: No petechiae / purpura   Problem List:   Principal Problem:   Rectal cancer ypT3ypN0 (0/16 LN) s/p LAR/ileostomy 02/21/2014 Active Problems:   Obesity (BMI 30-39.9)   Assessment  Stephen Costa  58 y.o. male  2 Days Post-Op  Procedure(s): TAKEDOWN OF LOOP ILEOSOTOMY, LAPAROSCOPPIC LYSIS OF ADHESIONS with removal of omental mass  Post-op ileus resolving  Plan:  PRN change outer dressing at old ileostomy site  Adv diet since +flatus   Follow-up pathology on omental mass.  -VTE prophylaxis- SCDs, etc  -mobilize as tolerated to help recovery  Adin Hector, M.D., F.A.C.S. Gastrointestinal and Minimally Invasive Surgery [facility] [address] [place], [place] [address] [phone] Main / Paging   08/10/2014   Results:   Labs: Results for orders placed or performed during the hospital encounter of 08/08/14 (from the past 48 hour(s))  Type and screen     Status: None   Collection Time: 08/08/14 12:30 PM  Result Value Ref Range   ABO/RH(D) B POS    Antibody Screen NEG    Sample Expiration 41/28/7867   Basic metabolic panel     Status: Abnormal   Collection Time: 08/09/14  4:15 AM  Result Value Ref Range   Sodium 138 137 - 147 mEq/L  Potassium 4.9 3.7 - 5.3 mEq/L   Chloride 98 96 - 112 mEq/L   CO2 24 19 - 32 mEq/L   Glucose, Bld 152 (H) 70 - 99 mg/dL   BUN 12 6 - 23 mg/dL   Creatinine, Ser 0.85 0.50 - 1.35 mg/dL   Calcium 9.2 8.4 - 10.5 mg/dL   GFR calc non Af Amer >90 >90 mL/min   GFR calc Af Amer >90 >90 mL/min    Comment: (NOTE) The eGFR has been calculated using the CKD EPI equation. This calculation has not been validated in all clinical  situations. eGFR's persistently <90 mL/min signify possible Chronic Kidney Disease.    Anion gap 16 (H) 5 - 15  CBC     Status: Abnormal   Collection Time: 08/09/14  4:15 AM  Result Value Ref Range   WBC 9.8 4.0 - 10.5 K/uL   RBC 4.00 (L) 4.22 - 5.81 MIL/uL   Hemoglobin 13.2 13.0 - 17.0 g/dL   HCT 38.3 (L) 39.0 - 52.0 %   MCV 95.8 78.0 - 100.0 fL   MCH 33.0 26.0 - 34.0 pg   MCHC 34.5 30.0 - 36.0 g/dL   RDW 13.6 11.5 - 15.5 %   Platelets 302 150 - 400 K/uL  Magnesium     Status: None   Collection Time: 08/09/14  4:15 AM  Result Value Ref Range   Magnesium 1.9 1.5 - 2.5 mg/dL    Imaging / Studies: No results found.  Medications / Allergies: per chart  Antibiotics: Anti-infectives    Start     Dose/Rate Route Frequency Ordered Stop   08/09/14 0200  cefoTEtan (CEFOTAN) 2 g in dextrose 5 % 50 mL IVPB     2 g100 mL/hr over 30 Minutes Intravenous Every 12 hours 08/08/14 1837 08/09/14 0230   08/08/14 1215  cefoTEtan (CEFOTAN) 2 g in dextrose 5 % 50 mL IVPB     2 g100 mL/hr over 30 Minutes Intravenous 60 min pre-op 08/08/14 1201 08/08/14 1400       Note: Portions of this report may have been transcribed using voice recognition software. Every effort was made to ensure accuracy; however, inadvertent computerized transcription errors may be present.   Any transcriptional errors that result from this process are unintentional.

## 2014-08-11 MED ORDER — HYDROMORPHONE HCL 2 MG PO TABS
2.0000 mg | ORAL_TABLET | ORAL | Status: DC | PRN
  Administered 2014-08-11: 2 mg via ORAL
  Filled 2014-08-11: qty 1

## 2014-08-11 NOTE — Progress Notes (Signed)
Removed 4 wicks from abd incision this morning at 0610, wicks intact.  Patient tolerated well. Had minimal drainage.

## 2014-08-11 NOTE — Progress Notes (Signed)
Nurse reviewed discharge instructions with pt and his wife.  Pt verbalized understanding of discharge instructions, new medication and follow up appointment.  No concerns at time of discharge.

## 2014-08-11 NOTE — Discharge Summary (Signed)
Physician Discharge Summary  Patient ID: Stephen Costa MRN: [id number] DOB/AGE: 56-Jan-1957 56 y.o.  Admit date: 08/08/2014 Discharge date: 08/11/2014  Patient Care Team: Lynne Logan, MD as PCP - General (Family Medicine) [name], MD as Consulting Physician (Gastroenterology) Costa Boston, MD as Consulting Physician (General Surgery) Ladell Pier, MD as Consulting Physician (Oncology) Marye Round, MD as Consulting Physician (Radiation Oncology)  Admission Diagnoses: Principal Problem:   Rectal cancer ypT3ypN0 (0/16 LN) s/p LAR/ileostomy 02/21/2014 Active Problems:   Obesity (BMI 30-39.9)   Discharge Diagnoses:  Principal Problem:   Rectal cancer ypT3ypN0 (0/16 LN) s/p LAR/ileostomy 02/21/2014 Active Problems:   Obesity (BMI 30-39.9)   PRE-OPERATIVE DIAGNOSIS: Loop Ileostomy in place for fecal diversion  POST-OPERATIVE DIAGNOSIS: Loop Ileostomy in place for fecal diversion  PROCEDURE: Procedure(s):  LAPAROSCOPPIC LYSIS OF ADHESIONS LAP-ASSISTED REMOVAL OF OMENTAL MASS (FAT NECROSIS)  TAKEDOWN OF LOOP ILEOSOTOMY  SURGEON: Surgeon(s): Costa Boston, MD  Consults: None  Hospital Course:   The patient underwent the surgery above.  Postoperatively, the patient gradually mobilized and advanced to a solid diet.  Pain and other symptoms were treated aggressively.  Drainage from old ileostomy site with blood initially - tapered off.  By the time of discharge, the patient was walking well the hallways, eating food, having flatus.  Pain was well-controlled on an oral medications.  Based on meeting discharge criteria and continuing to recover, I felt it was safe for the patient to be discharged from the hospital to further recover with close followup. Postoperative recommendations were discussed in detail.  They are written as well.   Significant Diagnostic Studies:  Results for orders placed or performed during the hospital encounter of 08/08/14 (from the  past 72 hour(s))  Type and screen     Status: None   Collection Time: 08/08/14 12:30 PM  Result Value Ref Range   ABO/RH(D) B POS    Antibody Screen NEG    Sample Expiration 77/41/2878   Basic metabolic panel     Status: Abnormal   Collection Time: 08/09/14  4:15 AM  Result Value Ref Range   Sodium 138 137 - 147 mEq/L   Potassium 4.9 3.7 - 5.3 mEq/L   Chloride 98 96 - 112 mEq/L   CO2 24 19 - 32 mEq/L   Glucose, Bld 152 (H) 70 - 99 mg/dL   BUN 12 6 - 23 mg/dL   Creatinine, Ser 0.85 0.50 - 1.35 mg/dL   Calcium 9.2 8.4 - 10.5 mg/dL   GFR calc non Af Amer >90 >90 mL/min   GFR calc Af Amer >90 >90 mL/min    Comment: (NOTE) The eGFR has been calculated using the CKD EPI equation. This calculation has not been validated in all clinical situations. eGFR's persistently <90 mL/min signify possible Chronic Kidney Disease.    Anion gap 16 (H) 5 - 15  CBC     Status: Abnormal   Collection Time: 08/09/14  4:15 AM  Result Value Ref Range   WBC 9.8 4.0 - 10.5 K/uL   RBC 4.00 (L) 4.22 - 5.81 MIL/uL   Hemoglobin 13.2 13.0 - 17.0 g/dL   HCT 38.3 (L) 39.0 - 52.0 %   MCV 95.8 78.0 - 100.0 fL   MCH 33.0 26.0 - 34.0 pg   MCHC 34.5 30.0 - 36.0 g/dL   RDW 13.6 11.5 - 15.5 %   Platelets 302 150 - 400 K/uL  Magnesium     Status: None   Collection Time: 08/09/14  4:15 AM  Result Value Ref Range   Magnesium 1.9 1.5 - 2.5 mg/dL    No results found.  Discharge Exam: Blood pressure 140/82, pulse 79, temperature 97.7 F (36.5 C), temperature source Oral, resp. rate 16, height 5' 7"  (1.702 m), weight 272 lb 4.8 oz (123.514 kg), SpO2 95 %.  General: Pt awake/alert/oriented x4 in no major acute distress Eyes: PERRL, normal EOM. Sclera nonicteric Neuro: CN II-XII intact w/o focal sensory/motor deficits. Lymph: No head/neck/groin lymphadenopathy Psych:  No delerium/psychosis/paranoia HENT: Normocephalic, Mucus membranes moist.  No thrush Neck: Supple, No tracheal deviation Chest: No pain.  Good  respiratory excursion. CV:  Pulses intact.  Regular rhythm MS: Normal AROM mjr joints.  No obvious deformity Abdomen: Soft, Nondistended.  Min tender RLQ.  Mild edema at old ileostomy site but minimal drainage / no bleeding.  No incarcerated hernias. Ext:  SCDs BLE.  No significant edema.  No cyanosis Skin: No petechiae / purpura  Discharged Condition: good   Past Medical History  Diagnosis Date  . Rectal cancer 09/26/2013    ypT3ypN0 (0/16 LN)  . Hemorrhoids   . Cancer 10/05/13    rectum  . Allergy   . Arthritis     knees  . History of radiation therapy 10/31/13-12/08/13    rectal 50.4Gy total  . Rectal cancer   . Sleep apnea     doesn't wear CPAP usually  . History of chemotherapy     completed approximately 06/13/2014      Past Surgical History  Procedure Laterality Date  . Lumbar disc surgery  1984, 1987  . Wrist surgery Right 2006  . Shoulder surgery Right 1999  . Surgery scrotal / testicular Left 58 y/o    respositioning into scrotum  . Eus N/A 10/05/2013    Procedure: LOWER ENDOSCOPIC ULTRASOUND (EUS);  Surgeon: [name], MD;  Location: [name];  Service: Endoscopy;  Laterality: N/A;  . Flexible sigmoidoscopy N/A 10/05/2013    Procedure: FLEXIBLE SIGMOIDOSCOPY;  Surgeon: [name], MD;  Location: [facility];  Service: Endoscopy;  Laterality: N/A;  . Proctoscopy N/A 02/21/2014    Procedure: RIGID PROCTOSCOPY;  Surgeon: Adin Hector, MD;  Location: [facility];  Service: General;  Laterality: N/A;  . Low anterior bowel resection  02/21/2014  . Diverting ileostomy  02/21/2014  . Ileostomy closure N/A 07/20/2014    Procedure:  EXAM UNDER ANESTHESIA, DIAGNOSTIC LAPAROSCOPY LYSIS OF ADHESIONS;  Surgeon: Costa Boston, MD;  Location: [facility];  Service: General;  Laterality: N/A;  . Eus N/A 08/02/2014    Procedure: LOWER ENDOSCOPIC ULTRASOUND (EUS);  Surgeon: [name], MD;  Location: [name];  Service: Endoscopy;  Laterality: N/A;  . Ileostomy closure N/A  08/08/2014    Procedure: TAKEDOWN OF LOOP ILEOSOTOMY, LAPAROSCOPPIC LYSIS OF ADHESIONS with removal of omental mass;  Surgeon: Costa Boston, MD;  Location: [facility];  Service: General;  Laterality: N/A;    History   Social History  . Marital Status: Married    Spouse Name: N/A    Number of Children: N/A  . 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 Smoking status: Former Smoker -- 1.00 packs/day for 40 years    Types: Cigarettes    Quit date: 02/21/2014  . Smokeless tobacco: Never Used  . Alcohol Use: No  . Drug Use: No  . Sexual Activity: Not on file  Other Topics Concern  . Not on file   Social History Narrative    Family History  Problem Relation Age of Onset  . Cancer Maternal Uncle     Throat Cancer  . Cancer Paternal Uncle     Colon Cancer    Current Facility-Administered Medications  Medication Dose Route Frequency Provider Last Rate Last Dose  . 0.9 %  sodium chloride infusion  250 mL Intravenous PRN Costa Boston, MD      . acetaminophen (TYLENOL) tablet 1,000 mg  1,000 mg Oral TID Costa Boston, MD   1,000 mg at 08/10/14 2211  . alum & mag hydroxide-simeth (MAALOX/MYLANTA) 200-200-20 MG/5ML suspension 30 mL  30 mL Oral Q6H PRN Costa Boston, MD      . alvimopan (ENTEREG) capsule 12 mg  12 mg Oral BID Costa Boston, MD   12 mg at 08/10/14 2211  . diphenhydrAMINE (BENADRYL) 12.5 MG/5ML elixir 12.5 mg  12.5 mg Oral Q6H PRN Costa Boston, MD       Or  . diphenhydrAMINE (BENADRYL) injection 12.5 mg  12.5 mg Intravenous Q6H PRN Costa Boston, MD      . heparin injection 5,000 Units  5,000 Units Subcutaneous 3 times per day Costa Boston, MD   5,000 Units at 08/11/14 0603  . HYDROmorphone (DILAUDID) injection 0.5-2 mg  0.5-2 mg Intravenous Q1H PRN Costa Boston, MD   2 mg at 08/10/14 2212  . HYDROmorphone (DILAUDID) tablet 2-4 mg  2-4 mg Oral Q4H PRN Costa Boston, MD      . lip balm (CARMEX)  ointment 1 application  1 application Topical BID Costa Boston, MD   1 application at 50/56/97 2212  . magic mouthwash  15 mL Oral QID PRN Costa Boston, MD   15 mL at 08/09/14 0111  . menthol-cetylpyridinium (CEPACOL) lozenge 3 mg  1 lozenge Oral PRN Costa Boston, MD      . metoprolol (LOPRESSOR) injection 5 mg  5 mg Intravenous 4 times per day Costa Boston, MD   5 mg at 08/11/14 0603  . ondansetron (ZOFRAN) tablet 4 mg  4 mg Oral Q6H PRN Costa Boston, MD       Or  . ondansetron [facility]) injection 4 mg  4 mg Intravenous Q6H PRN Costa Boston, MD      . phenol (CHLORASEPTIC) mouth spray 2 spray  2 spray Mouth/Throat PRN Costa Boston, MD      . promethazine (PHENERGAN) injection 6.25-12.5 mg  6.25-12.5 mg Intravenous Q4H PRN Costa Boston, MD      . saccharomyces boulardii (FLORASTOR) capsule 250 mg  250 mg Oral BID Costa Boston, MD   250 mg at 08/10/14 2211  . sodium chloride 0.9 % injection 3 mL  3 mL Intravenous Q12H Costa Boston, MD   3 mL at 08/10/14 2217  . sodium chloride 0.9 % injection 3 mL  3 mL Intravenous PRN Costa Boston, MD         Allergies  Allergen Reactions  . Oxycodone     MAKES PATIENT ANXIOUS & HYPER  . Tegaderm Ag Mesh [Silver]     Blisters    Disposition: 01-Home or Self Care  Discharge Instructions    Call MD for:  extreme fatigue    Complete by:  As directed      Call MD for:  extreme fatigue    Complete by:  As directed      Call MD for:  hives    Complete by:  As directed  Call MD for:  hives    Complete by:  As directed      Call MD for:  persistant nausea and vomiting    Complete by:  As directed      Call MD for:  persistant nausea and vomiting    Complete by:  As directed      Call MD for:  redness, tenderness, or signs of infection (pain, swelling, redness, odor or green/yellow discharge around incision site)    Complete by:  As directed      Call MD for:  redness, tenderness, or signs of infection (pain, swelling, redness, odor or green/yellow  discharge around incision site)    Complete by:  As directed      Call MD for:  severe uncontrolled pain    Complete by:  As directed      Call MD for:  severe uncontrolled pain    Complete by:  As directed      Call MD for:    Complete by:  As directed   Temperature > 101.268F     Call MD for:    Complete by:  As directed   Temperature > 101.268F     Diet - low sodium heart healthy    Complete by:  As directed      Discharge instructions    Complete by:  As directed   Please see discharge instruction sheets.  Also refer to handout given an office.  Please call our office if you have any questions or concerns [phone]     Discharge wound care:    Complete by:  As directed   If you have closed incisions, shower and bathe over these incisions with soap and water every day.  Remove all surgical dressings on postoperative day #3.  You do not need to replace dressings over the closed incisions unless you feel more comfortable with a Band-Aid covering it.   If you have an open wound that requires packing, please see wound care instructions.  In general, remove all dressings, wash wound with soap and water and then replace with saline moistened gauze.  Do the dressing change at least every day.  Please call our office [phone] if you have further questions.     Discharge wound care:    Complete by:  As directed   If you have closed incisions, shower and bathe over these incisions with soap and water every day.  Remove all surgical dressings on postoperative day #3.  You do not need to replace dressings over the closed incisions unless you feel more comfortable with a Band-Aid covering it.   If you have an open wound that requires packing, please see wound care instructions.  In general, remove all dressings, wash wound with soap and water and then replace with saline moistened gauze.  Do the dressing change at least every day.  Please call our office [phone] if you have further  questions.     Driving Restrictions    Complete by:  As directed   No driving until off narcotics and can safely swerve away without pain during an emergency     Driving Restrictions    Complete by:  As directed   No driving until off narcotics and can safely swerve away without pain during an emergency     Increase activity slowly    Complete by:  As directed   Walk an hour a day.  Use 20-30 minute walks.  When you can walk  30 minutes without difficulty, increase to low impact/moderate activities such as biking, jogging, swimming, sexual activity..  Eventually can increase to unrestricted activity when not feeling pain.  If you feel pain: STOP!Marland Kitchen   Let pain protect you from overdoing it.  Use ice/heat/over-the-counter pain medications to help minimize his soreness.  Use pain prescriptions as needed to remain active.  It is better to take extra pain medications and be more active than to stay bedridden to avoid all pain medications.     Increase activity slowly    Complete by:  As directed   Walk an hour a day.  Use 20-30 minute walks.  When you can walk 30 minutes without difficulty, increase to low impact/moderate activities such as biking, jogging, swimming, sexual activity..  Eventually can increase to unrestricted activity when not feeling pain.  If you feel pain: STOP!Marland Kitchen   Let pain protect you from overdoing it.  Use ice/heat/over-the-counter pain medications to help minimize his soreness.  Use pain prescriptions as needed to remain active.  It is better to take extra pain medications and be more active than to stay bedridden to avoid all pain medications.     Lifting restrictions    Complete by:  As directed   Avoid heavy lifting initially.  Do not push through pain.  You have no specific weight limit.  Coughing and sneezing or four more stressful to your incision than any lifting you will do. Pain will protect you from injury.  Therefore, avoid intense activity until off all narcotic pain  medications.  Coughing and sneezing or four more stressful to your incision than any lifting he will do.     Lifting restrictions    Complete by:  As directed   Avoid heavy lifting initially.  Do not push through pain.  You have no specific weight limit.  Coughing and sneezing or four more stressful to your incision than any lifting you will do. Pain will protect you from injury.  Therefore, avoid intense activity until off all narcotic pain medications.  Coughing and sneezing or four more stressful to your incision than any lifting he will do.     May shower / Bathe    Complete by:  As directed      May shower / Bathe    Complete by:  As directed      May walk up steps    Complete by:  As directed      May walk up steps    Complete by:  As directed      Sexual Activity Restrictions    Complete by:  As directed   Sexual activity as tolerated.  Do not push through pain.  Pain will protect you from injury.     Sexual Activity Restrictions    Complete by:  As directed   Sexual activity as tolerated.  Do not push through pain.  Pain will protect you from injury.     Walk with assistance    Complete by:  As directed   Walk over an hour a day.  May use a walker/cane/companion to help with balance and stamina.     Walk with assistance    Complete by:  As directed   Walk over an hour a day.  May use a walker/cane/companion to help with balance and stamina.            Medication List    TAKE these medications        HYDROmorphone 4 MG  tablet  Commonly known as:  DILAUDID  Take 0.5-2 tablets (2-8 mg total) by mouth every 6 (six) hours as needed for moderate pain or severe pain.     XELODA 500 MG tablet  Generic drug:  capecitabine  Last taken approximately 06/13/2014  .           Follow-up Information    Follow up with Kandee Escalante C., MD In 2 weeks.   Specialty:  General Surgery   Why:  To follow up after your operation, To follow up after your hospital stay   Contact  information:   [address] [place] [id number] [phone]        Signed: Morton Peters, M.D., F.A.C.S. Gastrointestinal and Minimally Invasive Surgery [facility] [address], [place], [place] [address] [phone] Main / Paging   08/11/2014, 7:04 AM

## 2014-09-24 ENCOUNTER — Telehealth (INDEPENDENT_AMBULATORY_CARE_PROVIDER_SITE_OTHER): Payer: Self-pay | Admitting: Surgery

## 2014-09-24 NOTE — Telephone Encounter (Signed)
Stephen Costa had LAR 02/21/2014 by Dr. Johney Maine.  Stephen Costa had the reversal of protective ileostomy 08/08/2014.    Stephen Costa last saw Dr. Johney Maine on 09/06/2014 mentions:  Ileostomy wound with 10 mm opening. Tracks about 3 cm deep. Good granulation. There are no skin ulcer medially. PDS stitch tales at base of wound. Trimmed down. No hematoma or abscess. No abdominal pain. No hernia. No peritonitis.  Stephen Costa called me 2:00 AM on 09/24/2014 about wound concerns.  Stephen Costa was having no fever or systemic symptoms.  I offered that Stephen Costa go to the ER or call our office Monday to check with Dr. Johney Maine.  Stephen Costa hung up abruptly.  [name], MD, [facility] Pager: [phone] Office phone:  [phone]

## 2014-10-12 ENCOUNTER — Other Ambulatory Visit: Payer: Managed Care, Other (non HMO)

## 2014-10-12 ENCOUNTER — Ambulatory Visit: Payer: Managed Care, Other (non HMO) | Admitting: Oncology

## 2014-10-13 ENCOUNTER — Telehealth: Payer: Self-pay | Admitting: Oncology

## 2014-10-13 NOTE — Telephone Encounter (Signed)
LEFT MESSAGE FOR PATIENT AND GAVE NEW TIME FOR ARRIVAL FOR 01/22 @ 9:15. CONTACT INFORMATION LEFT FOR PATIENT TO RETURN CALL

## 2014-10-20 ENCOUNTER — Telehealth: Payer: Self-pay | Admitting: Oncology

## 2014-10-20 ENCOUNTER — Other Ambulatory Visit: Payer: Managed Care, Other (non HMO)

## 2014-10-20 ENCOUNTER — Other Ambulatory Visit: Payer: Self-pay | Admitting: Oncology

## 2014-10-20 ENCOUNTER — Ambulatory Visit: Payer: Managed Care, Other (non HMO) | Admitting: Oncology

## 2014-10-20 NOTE — Telephone Encounter (Signed)
Confirm appointment rescheduled from 1/22 to 2/03.

## 2014-11-01 ENCOUNTER — Other Ambulatory Visit (HOSPITAL_BASED_OUTPATIENT_CLINIC_OR_DEPARTMENT_OTHER): Payer: Managed Care, Other (non HMO)

## 2014-11-01 ENCOUNTER — Telehealth: Payer: Self-pay | Admitting: Nurse Practitioner

## 2014-11-01 ENCOUNTER — Telehealth: Payer: Self-pay

## 2014-11-01 ENCOUNTER — Encounter: Payer: Self-pay | Admitting: Nurse Practitioner

## 2014-11-01 ENCOUNTER — Ambulatory Visit (HOSPITAL_BASED_OUTPATIENT_CLINIC_OR_DEPARTMENT_OTHER): Payer: Managed Care, Other (non HMO) | Admitting: Nurse Practitioner

## 2014-11-01 VITALS — BP 152/63 | HR 96 | Temp 97.6°F | Resp 20 | Ht 67.0 in | Wt 261.3 lb

## 2014-11-01 DIAGNOSIS — Z87891 Personal history of nicotine dependence: Secondary | ICD-10-CM

## 2014-11-01 DIAGNOSIS — C20 Malignant neoplasm of rectum: Secondary | ICD-10-CM

## 2014-11-01 DIAGNOSIS — R911 Solitary pulmonary nodule: Secondary | ICD-10-CM

## 2014-11-01 LAB — COMPREHENSIVE METABOLIC PANEL (CC13)
ALBUMIN: 3.6 g/dL (ref 3.5–5.0)
ALT: 16 U/L (ref 0–55)
AST: 16 U/L (ref 5–34)
Alkaline Phosphatase: 114 U/L (ref 40–150)
Anion Gap: 10 mEq/L (ref 3–11)
BILIRUBIN TOTAL: 0.27 mg/dL (ref 0.20–1.20)
BUN: 13.7 mg/dL (ref 7.0–26.0)
CO2: 26 mEq/L (ref 22–29)
CREATININE: 0.9 mg/dL (ref 0.7–1.3)
Calcium: 9.4 mg/dL (ref 8.4–10.4)
Chloride: 105 mEq/L (ref 98–109)
Glucose: 137 mg/dl (ref 70–140)
POTASSIUM: 3.8 meq/L (ref 3.5–5.1)
SODIUM: 141 meq/L (ref 136–145)
Total Protein: 7.3 g/dL (ref 6.4–8.3)

## 2014-11-01 NOTE — Telephone Encounter (Signed)
Left message to confirm appointment for June. Mailed calendar. °

## 2014-11-01 NOTE — Progress Notes (Signed)
  [facility] OFFICE PROGRESS NOTE   Diagnosis:   Rectal cancer  INTERVAL HISTORY:   Stephen Costa returns as scheduled. He underwent laparoscopic lysis of adhesions, lap assisted removal of an omental mass (fat necrosis) and takedown of loop ileostomy by Dr. Johney Maine on 08/08/2014. Bowel habits are erratic. No rectal bleeding. No pain. He has a good appetite. He is no longer smoking. He denies shortness of breath.  Objective:  Vital signs in last 24 hours:  Blood pressure 152/63, pulse 96, temperature 97.6 F (36.4 C), temperature source Oral, resp. rate 20, height 5\' 7"  (1.702 m), weight 261 lb 4.8 oz (118.525 kg).    HEENT:  No thrush or ulcers. Lymphatics:  No palpable cervical, supraclavicular, axillary or inguinal lymph nodes. Resp:  Lungs clear bilaterally. Cardio:  Regular rate and rhythm. GI:  Abdomen soft and nontender. No hepatomegaly. Small superficial wound near the middle of the ileostomy scar. Mild erythema surrounding the ieostomy scar. Vascular:  No leg edema.   Lab Results:  Lab Results  Component Value Date   WBC 9.8 08/09/2014   HGB 13.2 08/09/2014   HCT 38.3* 08/09/2014   MCV 95.8 08/09/2014   PLT 302 08/09/2014   NEUTROABS 4.1 05/31/2014    Imaging:  No results found.  Medications: I have reviewed the patient's current medications.  Assessment/Plan: 1. 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  Initiation of radiation and Xeloda 10/31/2013; completed on 12/07/2013.   Status post low anterior resection with diverting loop ileostomy 02/21/2014. Final pathology with microscopic foci of residual adenocarcinoma, largest focus 0.4 cm; extension focally beyond the muscularis propria, involving pericolonic fatty tissue; no lymph-vascular invasion; no perineural invasion; final resection margins negative.   Pathologic stage II (ypT3, ypN0, ypMX).   Cycle 1 adjuvant Xeloda 03/14/2014.   Cycle 2 adjuvant Xeloda 04/04/2014.    Cycle 3 adjuvant Xeloda 04/25/2014.   Cycle 4 adjuvant Xeloda 05/16/2014   Cycle 5 adjuvant Xeloda 06/06/2014  CT abdomen/pelvis 07/12/2014 with areas of soft tissue stranding associated with the omental fat in the upper abdomen predominantly in the left upper quadrant immediately beneath the splenic flexure of the colon. Postsurgical and post procedural changes of radiation therapy in the low anatomic pelvis without definite evidence to suggest local recurrence of disease. 2. Indeterminate 3 mm right upper lobe nodule on staging chest CT 09/30/2013. Stable 3 mm nodule in the right upper lobe on chest CT 07/14/2014. 3. Rectal pain, constipation and bleeding secondary to #1. Improved. 4. Tobacco use. He has quit smoking. 5. History of diarrhea in the setting of an ileostomy. 6. Status post diagnostic laparoscopy 07/20/2014 with findings of a firm inflamed mass in the left upper quadrant within the greater omentum. Frozen wedge biopsy showed adenocarcinoma. Final pathology showed fat necrosis and fibrosis. 7. Status post laparoscopic lysis of adhesions, lap assisted removal of an omental mass (fat necrosis) and takedown of loop ileostomy by Dr. Johney Maine on 08/08/2014.   Disposition: Stephen Costa remains in clinical remission from rectal cancer.  We will follow-up on the CEA from today. He will return for a follow-up visit and CEA in 4 months.  We discussed the pelvic physical therapy program. Due to his work schedule he declines a referral. He will let us know if he changes his mind.    Ned Card ANP/GNP-BC   11/01/2014  3:12 PM

## 2014-11-01 NOTE — Telephone Encounter (Signed)
LM at [facility] regarding pt last colonoscopy. Left callback number.

## 2014-11-02 LAB — CEA: CEA: 3.7 ng/mL (ref 0.0–5.0)

## 2014-11-03 ENCOUNTER — Other Ambulatory Visit: Payer: Managed Care, Other (non HMO)

## 2014-11-03 ENCOUNTER — Ambulatory Visit: Payer: Managed Care, Other (non HMO) | Admitting: Nurse Practitioner

## 2014-12-28 ENCOUNTER — Telehealth: Payer: Self-pay | Admitting: *Deleted

## 2014-12-28 NOTE — Telephone Encounter (Signed)
Patient's wife called to say her husband is having problems and would like to be seen sooner than  03/01/15.  She said his "wound is infected, " he has urinary frequency and shortness of breath.  Encouraged her to have him call his surgeon if the wound is infected.  She said she did and they told him to come into [facility] when he arrives back in town (on April 11th).  Strongly encouraged the patient's wife to have him seen as soon as possible if his wound is infected. She said that he just texted her a photo of the wound and she would try to convince him to come home sooner to have the wound looked at.  Suggested he see someone where ever he is, if he won't come home.  She is concerned about the shortness of breath and hopes it is not the cancer coming back and would still like for him to have an appointment with Dr. Benay Spice or Ned Card on either April 11th or 12th, if at all possible.

## 2014-12-28 NOTE — Telephone Encounter (Signed)
Schedule with Lattie Haw 01/08/2015

## 2014-12-29 ENCOUNTER — Telehealth: Payer: Self-pay | Admitting: Nurse Practitioner

## 2014-12-29 ENCOUNTER — Other Ambulatory Visit: Payer: Self-pay | Admitting: *Deleted

## 2014-12-29 NOTE — Telephone Encounter (Signed)
Spoke with  Patient and he is aware of his appointment

## 2015-01-08 ENCOUNTER — Telehealth: Payer: Self-pay

## 2015-01-08 ENCOUNTER — Ambulatory Visit (HOSPITAL_BASED_OUTPATIENT_CLINIC_OR_DEPARTMENT_OTHER): Payer: Self-pay | Admitting: Nurse Practitioner

## 2015-01-08 VITALS — BP 158/95 | HR 105 | Temp 97.8°F | Resp 19 | Ht 67.0 in | Wt 273.8 lb

## 2015-01-08 DIAGNOSIS — C20 Malignant neoplasm of rectum: Secondary | ICD-10-CM

## 2015-01-08 DIAGNOSIS — F5221 Male erectile disorder: Secondary | ICD-10-CM

## 2015-01-08 DIAGNOSIS — R0600 Dyspnea, unspecified: Secondary | ICD-10-CM

## 2015-01-08 NOTE — Progress Notes (Addendum)
[place] OFFICE PROGRESS NOTE   Diagnosis:  Rectal cancer  INTERVAL HISTORY:   Stephen Costa returns prior to scheduled follow-up. 10-14 days ago while out-of-town he developed increased erythema and tenderness around the ostomy reversal scar at the right abdomen. He "removed" a persistent scab and aspirated fluid daily until the redness improved. No associated fever. He has noted increased shortness of breath over the past few months. No fever or cough. No chest pain. No leg swelling or calf pain. He feels the dyspnea occurs with activity. His wife thinks he is experiencing is at rest as well. He quit smoking about a year ago. When questioned about how long he smoked he stated "almost all my life". He thinks he may have passed some kidney stones over the past weekend. For greater than one year he has noted erythema/redness/swelling intermittently at the left medial lower areola. When this occurs he aspirates "pus" and symptoms improve. He is unable to achieve an erection. He is interested in a referral to urology.  Objective:  Vital signs in last 24 hours:  Blood pressure 158/95, pulse 105, temperature 97.8 F (36.6 C), temperature source Oral, resp. rate 19, height 5\' 7"  (1.702 m), weight 273 lb 12.8 oz (124.195 kg), SpO2 97 %.    HEENT: No thrush or ulcers. Lymphatics: No palpable cervical, supra clavicular or axillary lymph nodes. Resp: Distant breath sounds. Lungs are clear. No respiratory distress. Cardio: Regular rate and rhythm. GI: Abdomen is soft. No hepatomegaly. Transverse scar at the right abdomen with mild surrounding erythema; there is some induration surrounding the midpoint of the scar. Small scab near the center of the scar. Vascular: Trace bilateral pretibial edema. Breasts: The left nipple is inverted (reported to be chronic). Thickening/firmness at the medial lower areola.  Lab Results:  Lab Results  Component Value Date   WBC 9.8 08/09/2014   HGB  13.2 08/09/2014   HCT 38.3* 08/09/2014   MCV 95.8 08/09/2014   PLT 302 08/09/2014   NEUTROABS 4.1 05/31/2014    Imaging:  No results found.  Medications: I have reviewed the patient's current medications.  Assessment/Plan: 1. 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  Initiation of radiation and Xeloda 10/31/2013; completed on 12/07/2013.   Status post low anterior resection with diverting loop ileostomy 02/21/2014. Final pathology with microscopic foci of residual adenocarcinoma, largest focus 0.4 cm; extension focally beyond the muscularis propria, involving pericolonic fatty tissue; no lymph-vascular invasion; no perineural invasion; final resection margins negative.   Pathologic stage II (ypT3, ypN0, ypMX).   Cycle 1 adjuvant Xeloda 03/14/2014.   Cycle 2 adjuvant Xeloda 04/04/2014.   Cycle 3 adjuvant Xeloda 04/25/2014.   Cycle 4 adjuvant Xeloda 05/16/2014   Cycle 5 adjuvant Xeloda 06/06/2014  CT abdomen/pelvis 07/12/2014 with areas of soft tissue stranding associated with the omental fat in the upper abdomen predominantly in the left upper quadrant immediately beneath the splenic flexure of the colon. Postsurgical and post procedural changes of radiation therapy in the low anatomic pelvis without definite evidence to suggest local recurrence of disease. 2. Indeterminate 3 mm right upper lobe nodule on staging chest CT 09/30/2013. Stable 3 mm nodule in the right upper lobe on chest CT 07/14/2014. 3. Rectal pain, constipation and bleeding secondary to #1. Improved. 4. Tobacco use. He has quit smoking. 5. History of diarrhea in the setting of an ileostomy. 6. Status post diagnostic laparoscopy 07/20/2014 with findings of a firm inflamed mass in the left upper quadrant within  the greater omentum. Frozen wedge biopsy showed adenocarcinoma. Final pathology showed fat necrosis and fibrosis. 7. Status post laparoscopic lysis of adhesions, lap assisted removal  of an omental mass (fat necrosis) and takedown of loop ileostomy by Dr. Johney Maine on 08/08/2014.   Disposition: Stephen Costa presents today with multiple complaints. He may have an infection/abscess at the ileostomy reversal scar. He will be seen later today at the surgeon's office. We will also ask that they evaluate the left breast for a possible cyst.  The dyspnea is likely related to deconditioning and underlying lung disease related to smoking. We recommended he follow-up with Dr. Nancy Fetter.  For the erectile dysfunction we made a referral to urology.  He will keep his scheduled follow-up appointment 03/01/2015 with our office. He will contact the office in the interim with further problems.  Patient seen with Dr. Benay Spice. 25 minutes were spent face-to-face at today's visit with the majority of that time involved in counseling/coordination of care.  Ned Card ANP/GNP-BC   01/08/2015  12:21 PM   This was a shared visit with Ned Card. [name] was interviewed and examined. There is induration at the previous colostomy site. I suspect he has an infection. He will also  ask the surgeons to evaluate the apparent cyst at the left areola We referred him to the surgical clinic today.  I recommended he see his primary physician to evaluate the exertional dyspnea.  Julieanne Manson, M.D.

## 2015-01-08 NOTE — Telephone Encounter (Signed)
Called [facility] to see if someone was available today to evaluate pt ileostomy reversal scar, pt has drainage and tenderness. Also to evaluate a possible cyst left breast. Pt to come in at 430 for an appt with Dr. Excell Seltzer. Informed pt who verbalized understanding.

## 2015-02-24 ENCOUNTER — Encounter: Payer: Self-pay | Admitting: Nurse Practitioner

## 2015-02-27 ENCOUNTER — Other Ambulatory Visit: Payer: Self-pay | Admitting: *Deleted

## 2015-02-27 ENCOUNTER — Telehealth: Payer: Self-pay | Admitting: Oncology

## 2015-02-27 NOTE — Telephone Encounter (Signed)
Per 05/31 pt cancelled labs/ov due to being out of town, s/w pt he will c/b to r/s ... KJ

## 2015-03-01 ENCOUNTER — Other Ambulatory Visit: Payer: Self-pay

## 2015-03-01 ENCOUNTER — Ambulatory Visit: Payer: Commercial Managed Care - PPO | Admitting: Oncology

## 2015-05-02 ENCOUNTER — Telehealth: Payer: Self-pay | Admitting: *Deleted

## 2015-05-02 NOTE — Telephone Encounter (Signed)
Left message on voicemail for pt to call office to reschedule follow up.

## 2015-07-18 ENCOUNTER — Other Ambulatory Visit: Payer: Self-pay | Admitting: *Deleted

## 2015-07-19 ENCOUNTER — Telehealth: Payer: Self-pay | Admitting: Oncology

## 2015-07-19 NOTE — Telephone Encounter (Signed)
Called and left a message per pof to call and reschedule his apponitment   anne

## 2015-10-13 IMAGING — CT CT CHEST W/ CM
2 of 5 series · 13 of 32 positions shown, 18 images · IV contrast ([ID] [id number])
Comparison: None.

CLINICAL DATA: New diagnosis of rectal carcinoma. Low abdominal
pain with constipation. Smoker.

EXAM:
CT CHEST, ABDOMEN, AND PELVIS WITH CONTRAST
TECHNIQUE: Multidetector CT imaging of the chest, abdomen and pelvis was
performed following the standard protocol during bolus
administration of intravenous contrast.
CONTRAST:  125mL OMNIPAQUE IOHEXOL 300 MG/ML  SOLN

[Series 200: cor · coronal · 1.31mm/px · 8 of 208 slices shown, 13 images]
[im 24/208  soft-tissue]
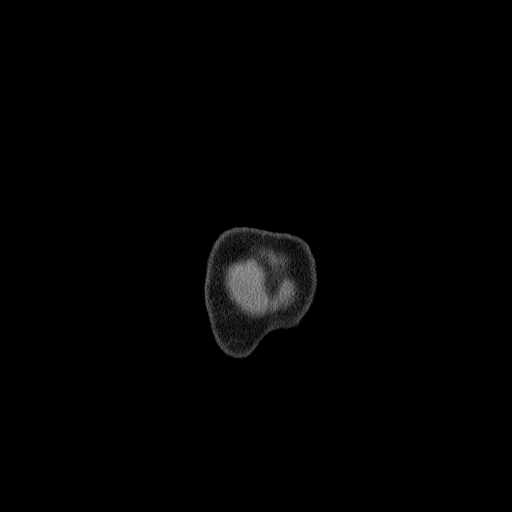
[im 24/208  lung]
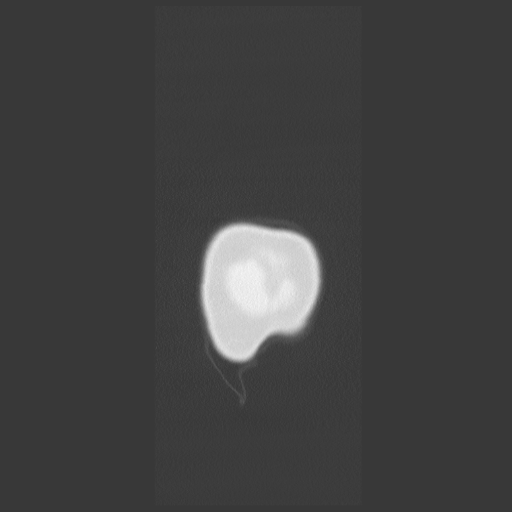
[im 24/208  bone]
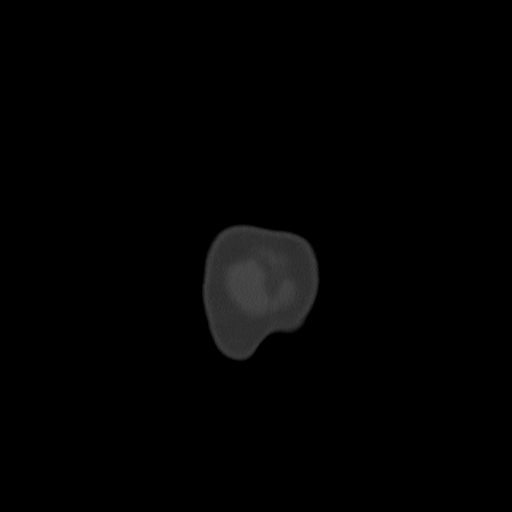
[im 47/208  soft-tissue]
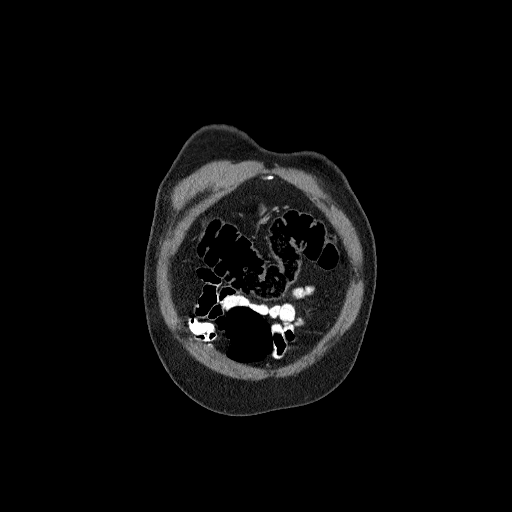
[im 47/208  lung]
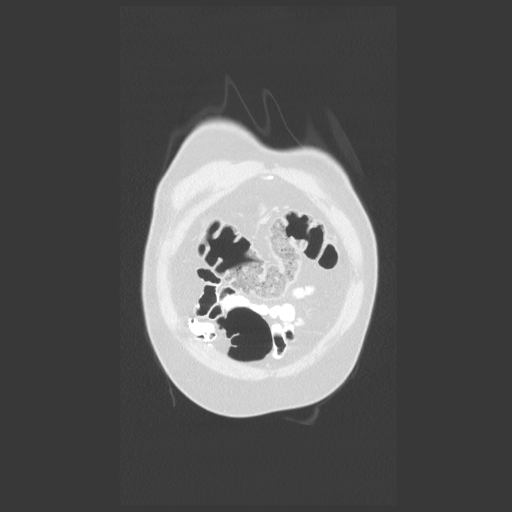
[im 70/208  soft-tissue]
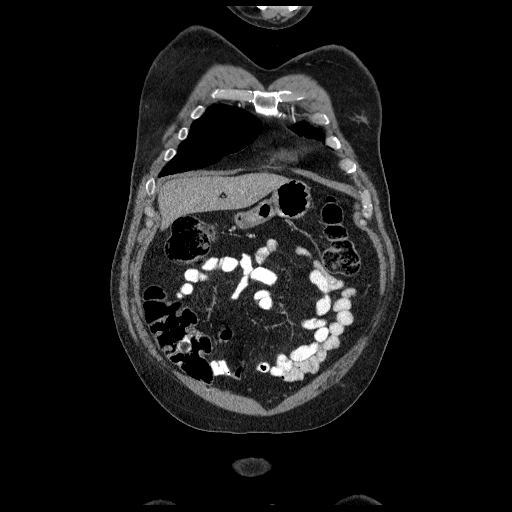
[im 70/208  lung]
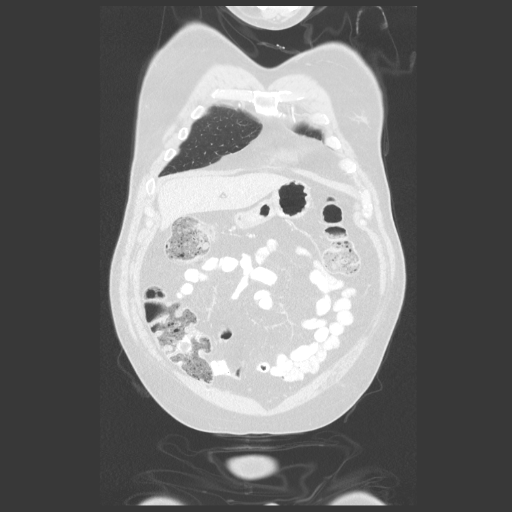
[im 93/208  soft-tissue]
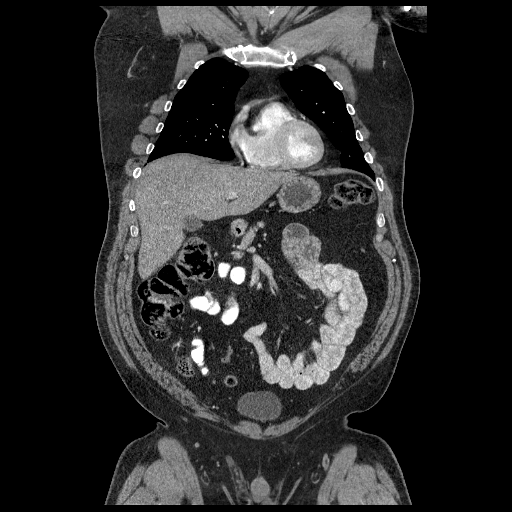
[im 93/208  lung]
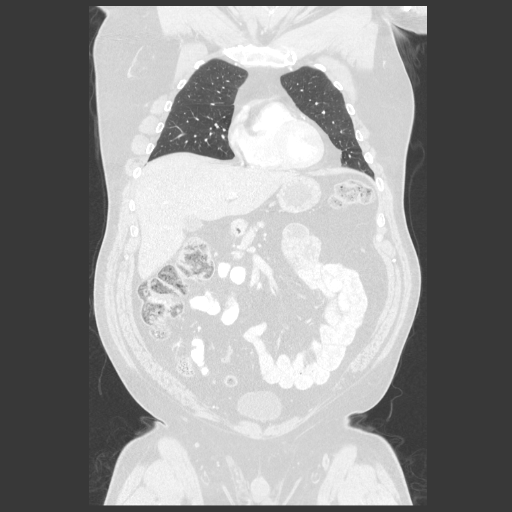
[im 116/208  soft-tissue]
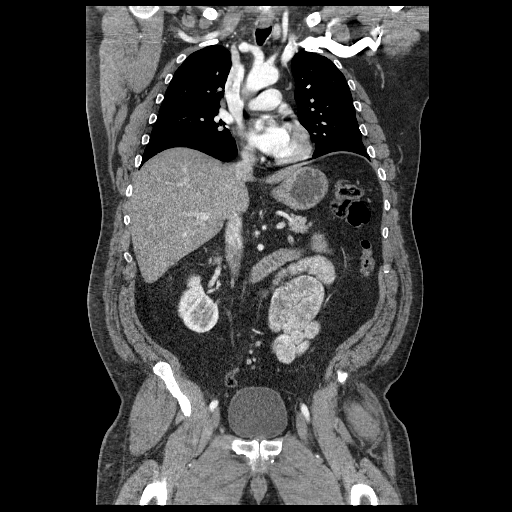
[im 139/208  soft-tissue]
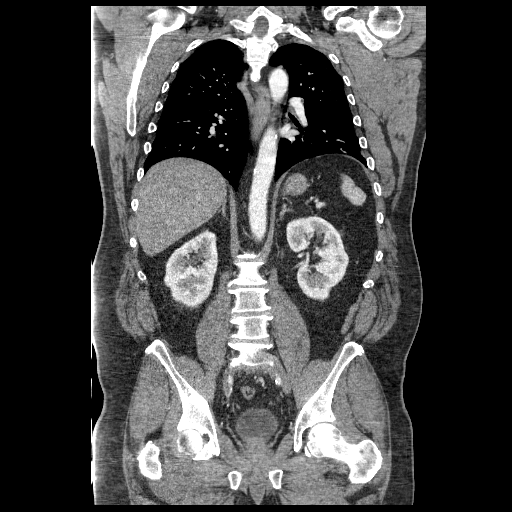
[im 162/208  soft-tissue]
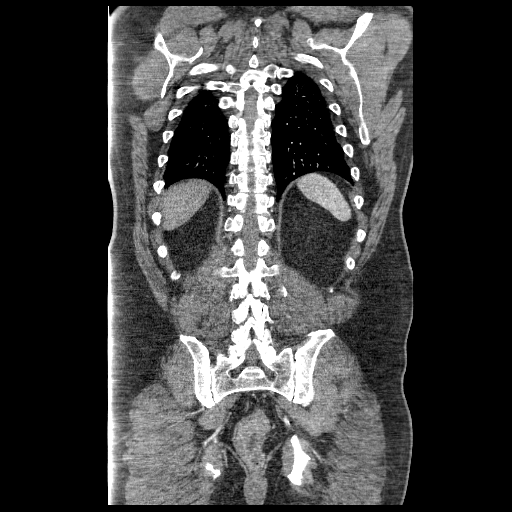
[im 185/208  soft-tissue]
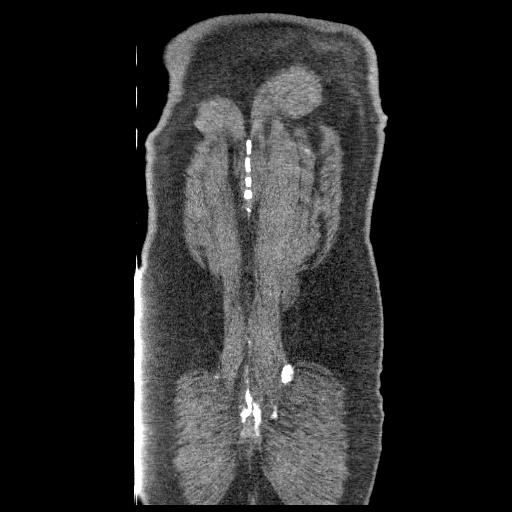

[Series 201: sag · sagittal · 1.31mm/px · 5 of 208 slices shown]
[im 24/208  soft-tissue]
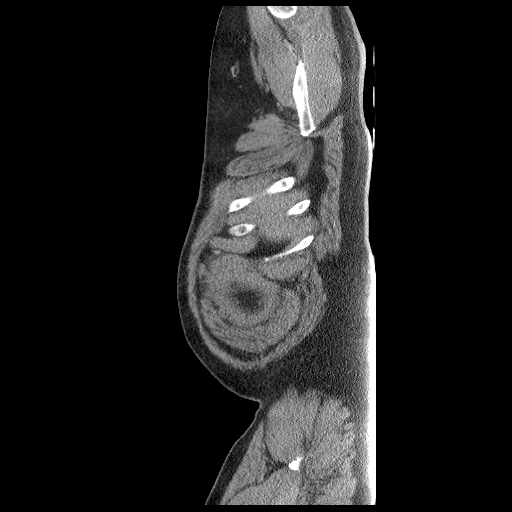
[im 47/208  soft-tissue]
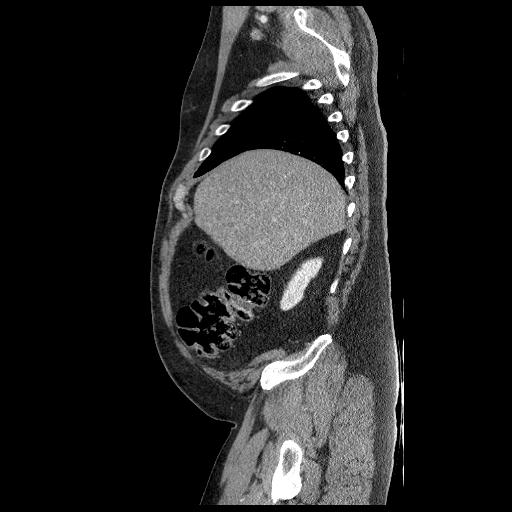
[im 70/208  soft-tissue]
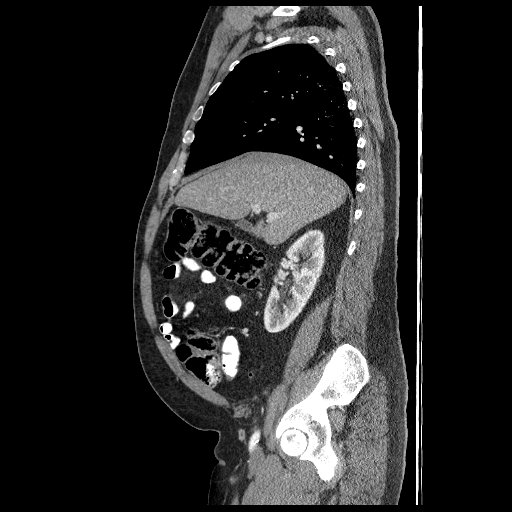
[im 93/208  soft-tissue]
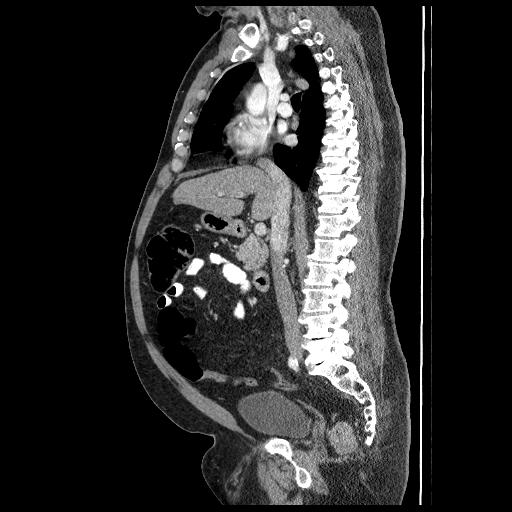
[im 116/208  soft-tissue]
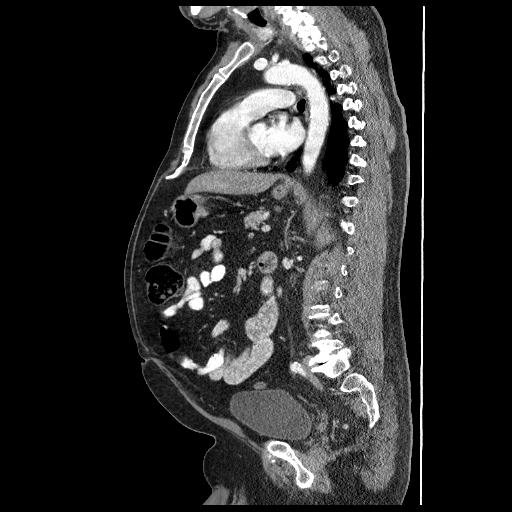

[13 of 32 positions shown; findings below may reference images not displayed]

FINDINGS: CT CHEST FINDINGS

No pathologically enlarged mediastinal, hilar or axillary lymph
nodes. Mild atherosclerotic calcification of the arterial
vasculature. Heart size normal. No pericardial effusion.

Mild centrilobular emphysema. Mild dependent atelectasis
bilaterally. 3 mm right upper lobe nodule (image 23), nonspecific.
No pleural fluid. Airway is unremarkable.

CT ABDOMEN AND PELVIS FINDINGS

Liver appears decreased in attenuation diffusely. Liver,
gallbladder, adrenal glands and right kidney are otherwise
unremarkable. A sub cm low-attenuation lesion in the interpolar left
kidney is too small to characterize but statistically, a cyst is
likely. Spleen, pancreas, stomach, small bowel and proximal colon
are unremarkable. There appears to be eccentric thickening of the
rectum (image 115), which may correspond to the patient's known
rectal carcinoma. A 5 mm short axis lymph node is seen adjacent to
the left aspect of the rectum (image 111).

Atherosclerotic calcification of the arterial vasculature without
abdominal aortic aneurysm. No pathologically enlarged lymph nodes.
No free fluid. No worrisome lytic or sclerotic lesions. Degenerative
changes are seen in the spine.
IMPRESSION: 1. Eccentric thickening of the rectal wall, likely corresponding to
the patient's known rectal carcinoma. No distant metastatic disease.
2. Tiny right upper lobe nodule. Typically, for patients at
increased risk of bronchogenic carcinoma, a 1 year follow-up is
recommended. However, in the setting of known malignancy, a shorter
interval followup may be indicated.
3. Liver appears fatty.

## 2016-03-05 IMAGING — CR DG CHEST 1V PORT
1 series · 1 of 1 positions shown · non-contrast
Comparison: Chest CT 09/30/2013.

CLINICAL DATA: Hypoxia.

EXAM:
PORTABLE CHEST - 1 VIEW

[AP]
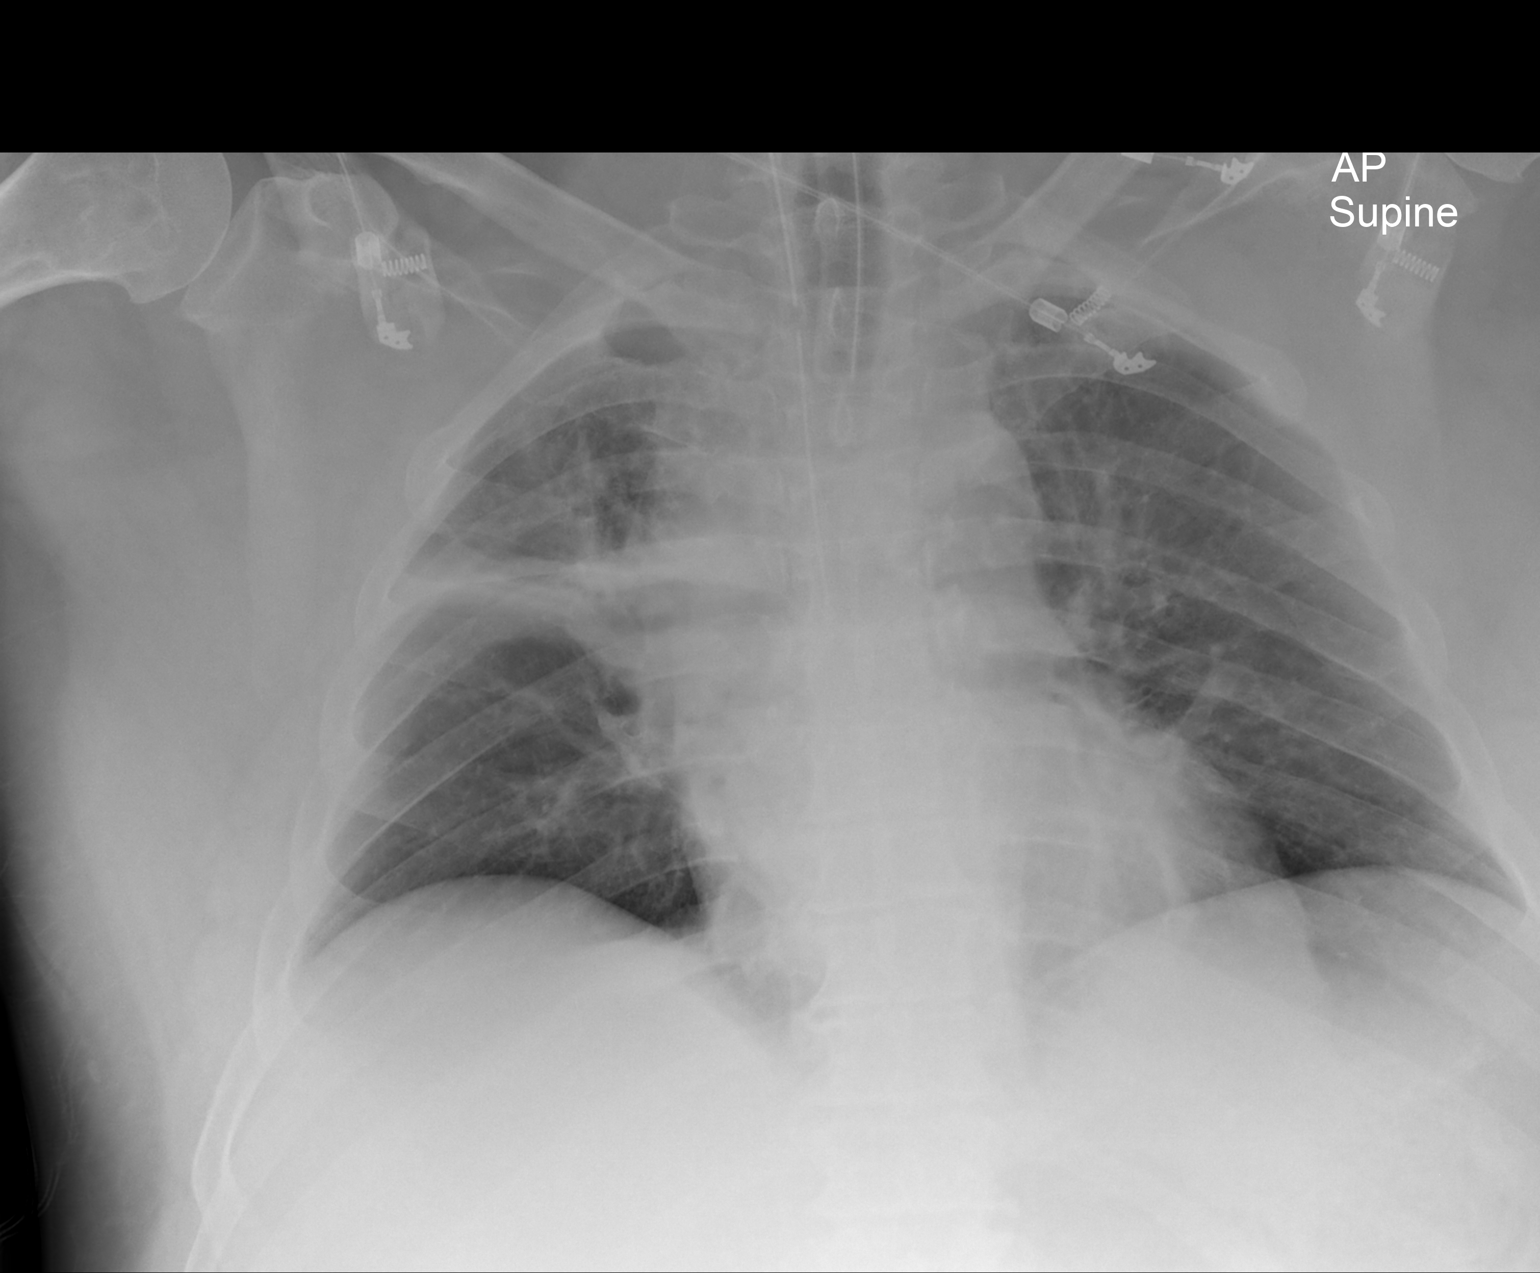

[1 of 1 positions shown; findings below may reference images not displayed]

FINDINGS: Endotracheal tube and NG tube noted in good anatomic position. Right
upper lobe atelectatic changes noted. No pleural effusion or
pneumothorax. Lucencies are noted in the right humeral proximal
metaphysis. Right humerus series suggested for further evaluation.
No acute bony abnormality .
IMPRESSION: 1. Right upper lobe atelectatic changes.
2. Endotracheal tube and NG tube in good anatomic position.
3. Lucencies within the proximal metaphysis of the right humerus,
right humerus series suggested for further evaluation.

## 2016-03-11 ENCOUNTER — Telehealth: Payer: Self-pay | Admitting: *Deleted

## 2016-03-11 NOTE — Telephone Encounter (Signed)
Left message for pt to call the office. Is he being seen by an oncologist? Does he want to follow up here?
